# Patient Record
Sex: Female | Born: 1975 | Race: Black or African American | Hispanic: No | Marital: Married | Smoking: Never smoker
Health system: Southern US, Community
[De-identification: ages and names within clinical notes are randomized; demographics above are authoritative.]

## PROBLEM LIST (undated history)

## (undated) DIAGNOSIS — K219 Gastro-esophageal reflux disease without esophagitis: Secondary | ICD-10-CM

## (undated) DIAGNOSIS — R51 Headache: Secondary | ICD-10-CM

## (undated) DIAGNOSIS — J45909 Unspecified asthma, uncomplicated: Secondary | ICD-10-CM

## (undated) DIAGNOSIS — T8484XA Pain due to internal orthopedic prosthetic devices, implants and grafts, initial encounter: Secondary | ICD-10-CM

## (undated) DIAGNOSIS — I10 Essential (primary) hypertension: Secondary | ICD-10-CM

## (undated) DIAGNOSIS — K589 Irritable bowel syndrome without diarrhea: Secondary | ICD-10-CM

## (undated) DIAGNOSIS — M1990 Unspecified osteoarthritis, unspecified site: Secondary | ICD-10-CM

## (undated) DIAGNOSIS — Q6650 Congenital pes planus, unspecified foot: Secondary | ICD-10-CM

## (undated) DIAGNOSIS — R519 Headache, unspecified: Secondary | ICD-10-CM

## (undated) HISTORY — PX: WISDOM TOOTH EXTRACTION: SHX21

## (undated) HISTORY — PX: INTRAUTERINE DEVICE INSERTION: SHX323

## (undated) HISTORY — DX: Congenital pes planus, unspecified foot: Q66.50

## (undated) HISTORY — DX: Unspecified asthma, uncomplicated: J45.909

## (undated) HISTORY — DX: Gastro-esophageal reflux disease without esophagitis: K21.9

## (undated) HISTORY — DX: Headache: R51

## (undated) HISTORY — DX: Headache, unspecified: R51.9

## (undated) HISTORY — PX: FOOT SURGERY: SHX648

---

## 1998-07-25 ENCOUNTER — Emergency Department (HOSPITAL_COMMUNITY): Admission: EM | Admit: 1998-07-25 | Discharge: 1998-07-25 | Payer: Self-pay | Admitting: Emergency Medicine

## 1998-07-25 ENCOUNTER — Encounter: Payer: Self-pay | Admitting: Emergency Medicine

## 1999-01-04 ENCOUNTER — Emergency Department (HOSPITAL_COMMUNITY): Admission: EM | Admit: 1999-01-04 | Discharge: 1999-01-04 | Payer: Self-pay

## 1999-11-26 ENCOUNTER — Other Ambulatory Visit: Admission: RE | Admit: 1999-11-26 | Discharge: 1999-11-26 | Payer: Self-pay | Admitting: Obstetrics & Gynecology

## 1999-12-09 ENCOUNTER — Encounter: Payer: Self-pay | Admitting: Pulmonary Disease

## 1999-12-09 ENCOUNTER — Ambulatory Visit (HOSPITAL_COMMUNITY): Admission: RE | Admit: 1999-12-09 | Discharge: 1999-12-09 | Payer: Self-pay | Admitting: Pulmonary Disease

## 2000-09-01 HISTORY — PX: FOOT SURGERY: SHX648

## 2002-03-31 ENCOUNTER — Other Ambulatory Visit: Admission: RE | Admit: 2002-03-31 | Discharge: 2002-03-31 | Payer: Self-pay | Admitting: Obstetrics and Gynecology

## 2003-04-10 ENCOUNTER — Other Ambulatory Visit: Admission: RE | Admit: 2003-04-10 | Discharge: 2003-04-10 | Payer: Self-pay | Admitting: Obstetrics and Gynecology

## 2004-08-19 ENCOUNTER — Other Ambulatory Visit: Admission: RE | Admit: 2004-08-19 | Discharge: 2004-08-19 | Payer: Self-pay | Admitting: Obstetrics and Gynecology

## 2004-10-02 ENCOUNTER — Ambulatory Visit: Payer: Self-pay | Admitting: Pulmonary Disease

## 2005-01-20 ENCOUNTER — Other Ambulatory Visit: Admission: RE | Admit: 2005-01-20 | Discharge: 2005-01-20 | Payer: Self-pay | Admitting: Obstetrics and Gynecology

## 2005-06-29 ENCOUNTER — Inpatient Hospital Stay (HOSPITAL_COMMUNITY): Admission: AD | Admit: 2005-06-29 | Discharge: 2005-06-29 | Payer: Self-pay | Admitting: Obstetrics and Gynecology

## 2005-07-07 ENCOUNTER — Inpatient Hospital Stay (HOSPITAL_COMMUNITY): Admission: AD | Admit: 2005-07-07 | Discharge: 2005-07-07 | Payer: Self-pay | Admitting: Obstetrics and Gynecology

## 2005-07-11 ENCOUNTER — Inpatient Hospital Stay (HOSPITAL_COMMUNITY): Admission: AD | Admit: 2005-07-11 | Discharge: 2005-07-16 | Payer: Self-pay | Admitting: Obstetrics and Gynecology

## 2005-08-27 ENCOUNTER — Other Ambulatory Visit: Admission: RE | Admit: 2005-08-27 | Discharge: 2005-08-27 | Payer: Self-pay | Admitting: Obstetrics and Gynecology

## 2005-10-10 ENCOUNTER — Emergency Department (HOSPITAL_COMMUNITY): Admission: EM | Admit: 2005-10-10 | Discharge: 2005-10-11 | Payer: Self-pay | Admitting: Emergency Medicine

## 2006-02-10 ENCOUNTER — Inpatient Hospital Stay (HOSPITAL_COMMUNITY): Admission: AD | Admit: 2006-02-10 | Discharge: 2006-02-10 | Payer: Self-pay | Admitting: Obstetrics and Gynecology

## 2006-05-23 ENCOUNTER — Inpatient Hospital Stay (HOSPITAL_COMMUNITY): Admission: AD | Admit: 2006-05-23 | Discharge: 2006-05-23 | Payer: Self-pay | Admitting: Obstetrics and Gynecology

## 2006-06-03 ENCOUNTER — Inpatient Hospital Stay (HOSPITAL_COMMUNITY): Admission: AD | Admit: 2006-06-03 | Discharge: 2006-06-06 | Payer: Self-pay | Admitting: Obstetrics and Gynecology

## 2006-06-03 ENCOUNTER — Encounter (INDEPENDENT_AMBULATORY_CARE_PROVIDER_SITE_OTHER): Payer: Self-pay | Admitting: Specialist

## 2007-02-13 IMAGING — US US FETAL BPP W/O NONSTRESS
1 series · 13 of 28 positions shown · non-contrast
Comparison: none

CLINICAL DATA: 36 weeks estimated gestational age with hypertension.  Assess presentation, estimated fetal weight and fetal well-being.

[Series 1: us fetal bpp w/o nonstress · 0.35mm/px · 13 of 44 slices shown]
[im 2/44]
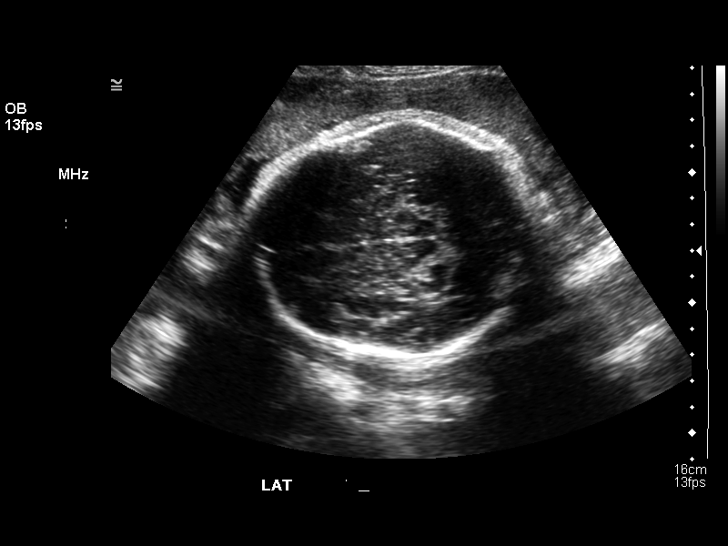
[im 5/44]
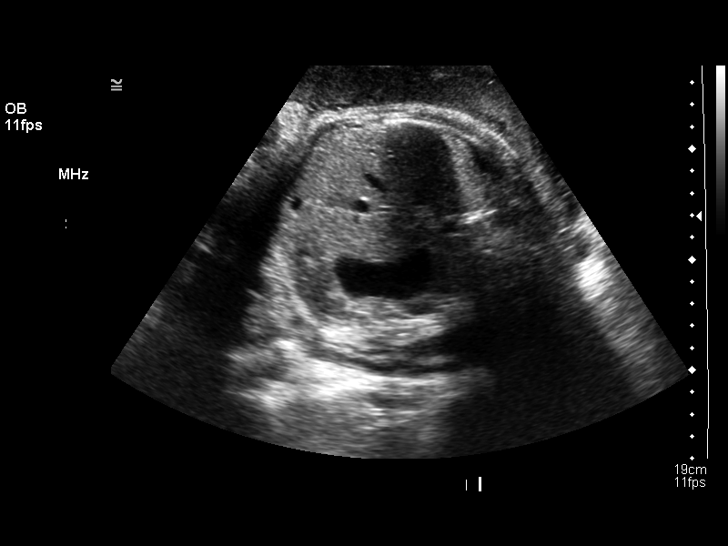
[im 8/44]
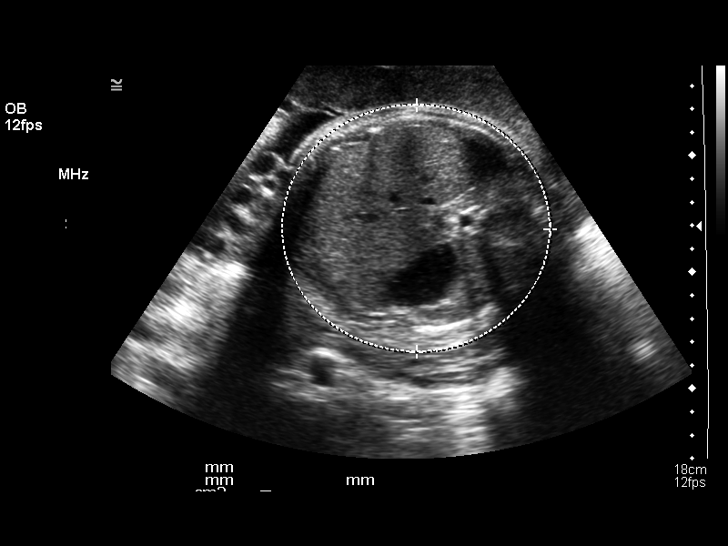
[im 12/44]
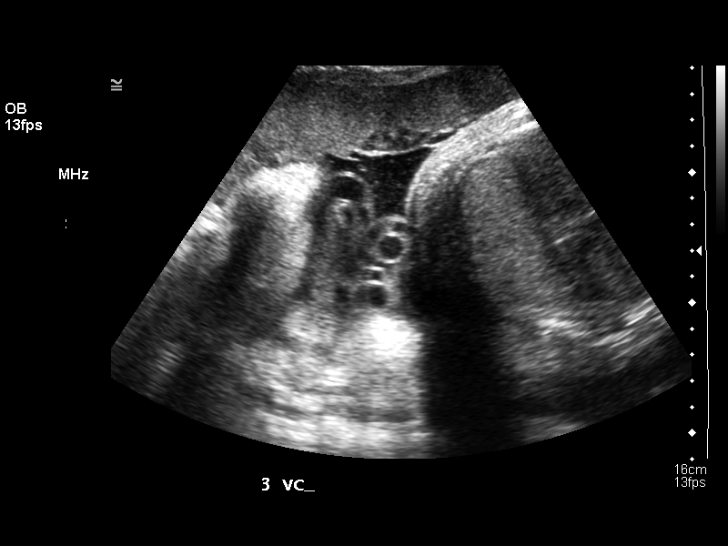
[im 15/44]
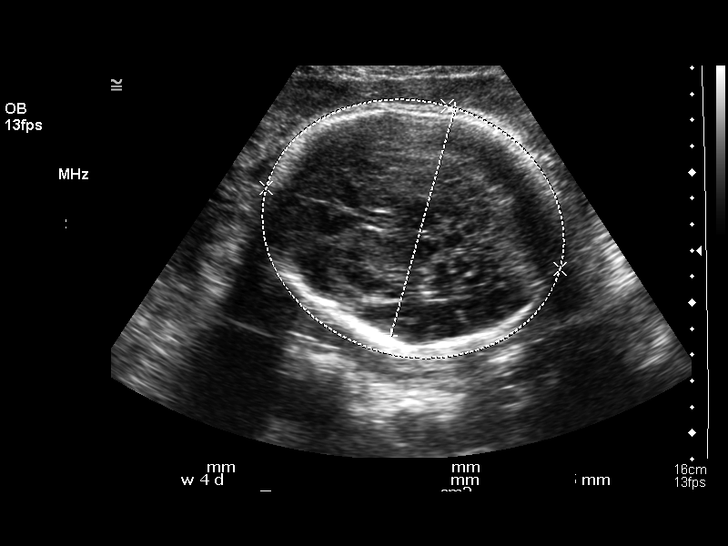
[im 18/44]
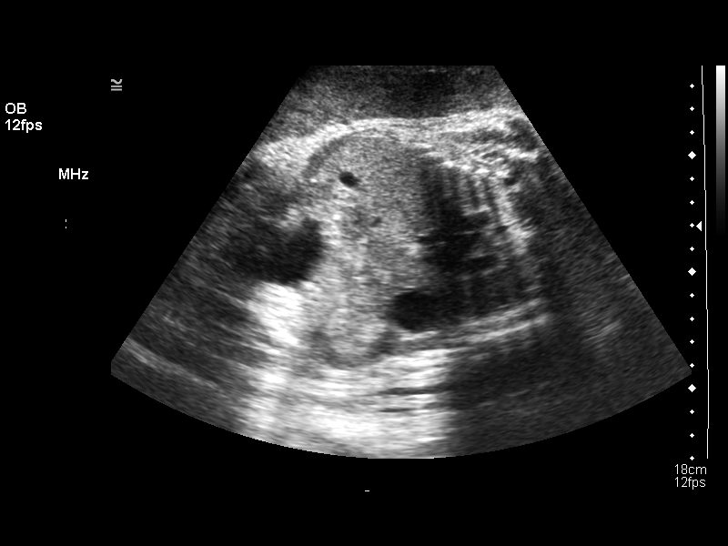
[im 23/44]
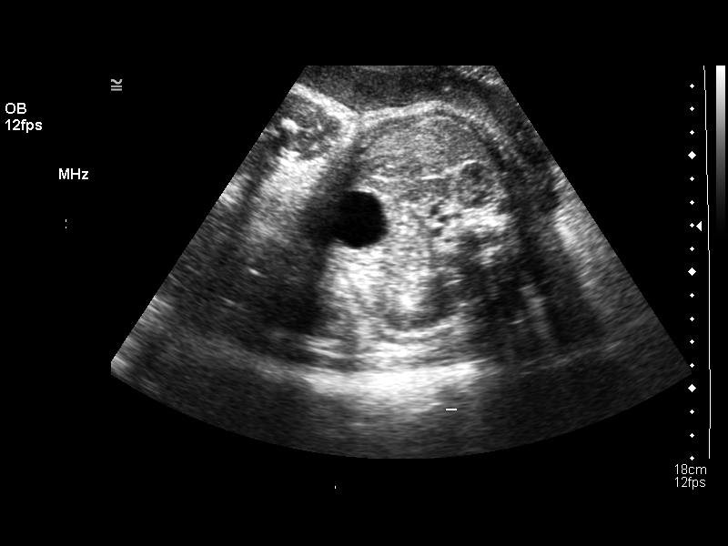
[im 26/44]
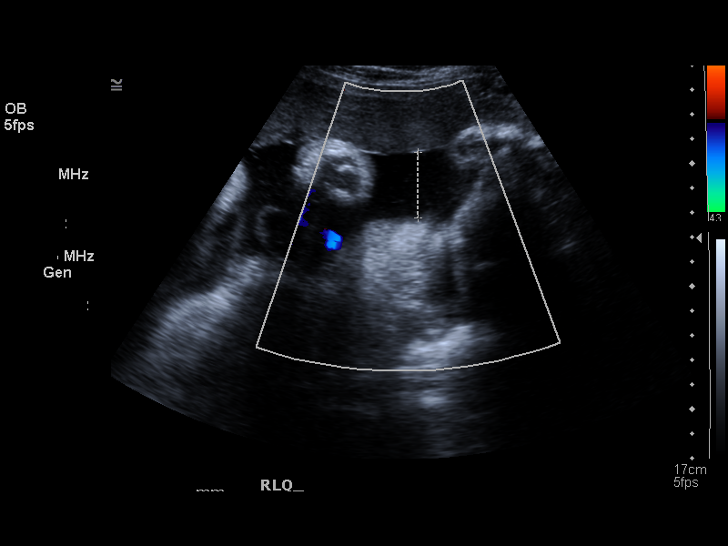
[im 29/44]
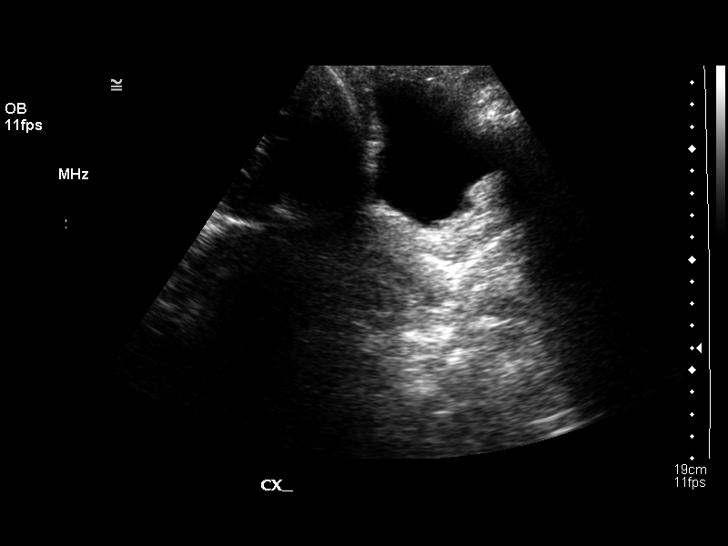
[im 32/44]
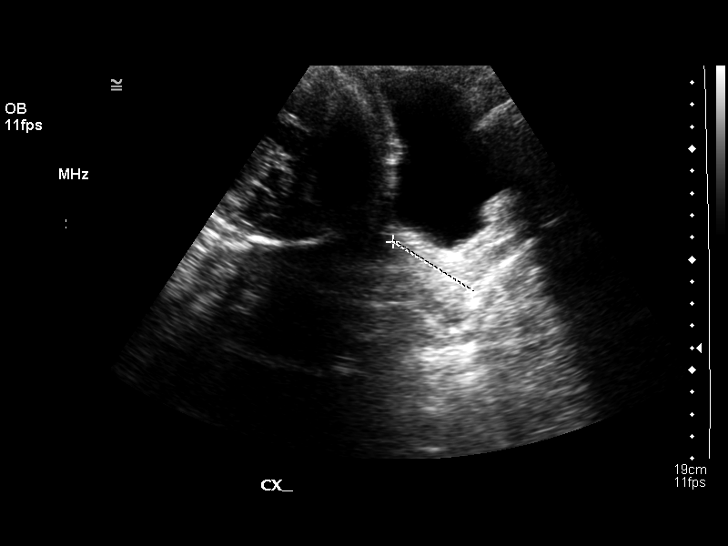
[im 36/44]
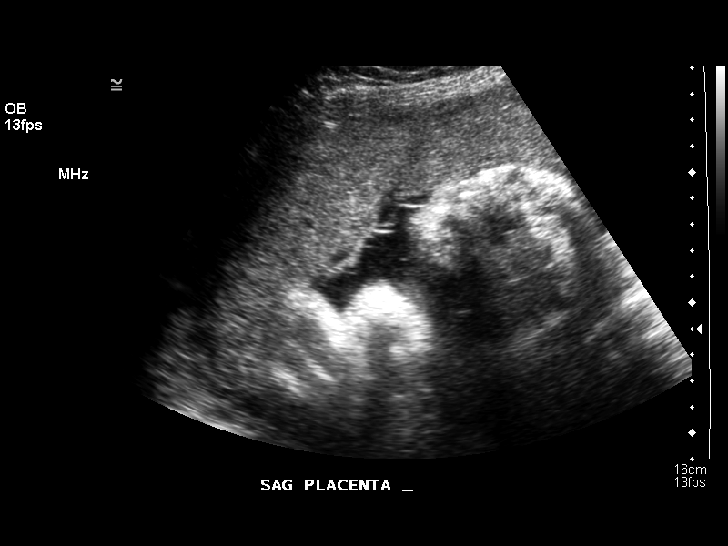
[im 39/44]
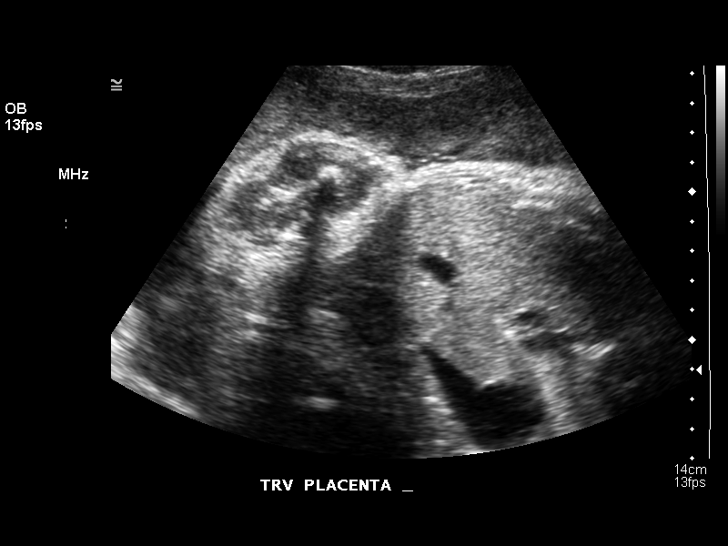
[im 42/44]
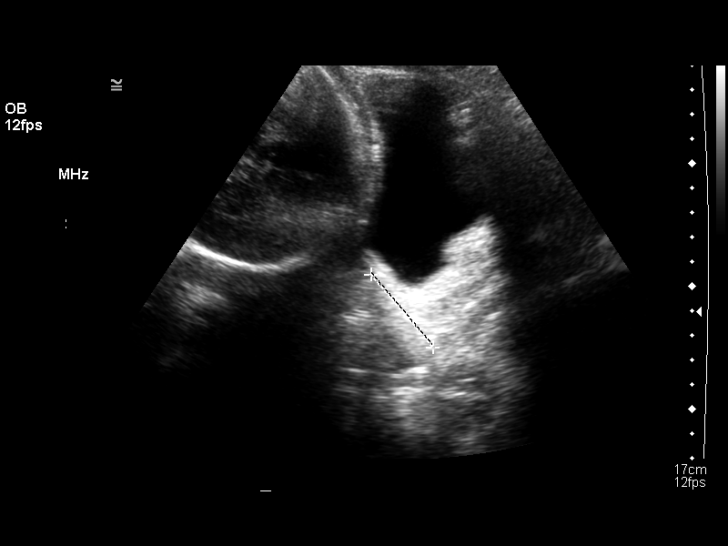

[13 of 28 positions shown; findings below may reference images not displayed]

OBSTETRICAL ULTRASOUND:
Number of Fetuses:  1
Heart Rate:  141
Movement:  Yes
Breathing:  Yes  
Presentation:  Cephalic
Placental Location:  Fundal, anterior
Grade:  II
Previa:  No
Amniotic Fluid (Subjective):  Normal
Amniotic Fluid (Objective):   10.4 cm AFI (5th -95th%ile = 7.7 ? 24.7 cm for 36 wks)

FETAL BIOMETRY
BPD:  9.2 cm   37 w 1 d
HC:  33.5 cm  38 w 2 d
AC:  34.8 cm   38 w 5 d
FL:  7.2 cm   37 w 0 d

MEAN GA:  37 w 6 d
Fetal indices are within normal limits.  
EFW:  7777 g (H) 90th ? 95th%ile (3532 ? 6303 g) For 36 wks

FETAL ANATOMY
Lateral Ventricles:    Visualized 
Thalami/CSP:      Visualized 
Posterior Fossa:  Not visualized 
Nuchal Region:    N/A
Spine:      Not visualized 
4 Chamber Heart on Left:      Not visualized 
Stomach on Left:      Visualized 
3 Vessel Cord:    Visualized 
Cord Insertion site:    Not visualized 
Kidneys:  Not visualized 
Bladder:  Visualized 
Extremities:      Not visualized 

ADDITIONAL ANATOMY VISUALIZED:  Orbits, diaphragm, and male genitalia.

Evaluation limited by:  Advanced gestational age.

MATERNAL UTERINE AND ADNEXAL FINDINGS
Cervix:   3.8 cm Transabdominally
BIOPHYSICAL PROFILE

Movement:  2    Time:  20 minutes
Breathing:  2
Tone:  2
Amniotic Fluid:  2

Total Score:  8
IMPRESSION: 1.  Single intrauterine pregnancy demonstrating an estimated gestational age by ultrasound of 37 weeks and 6 days.  This is 1 week and 6 days ahead of assigned gestational age of 36 weeks and 0 days.  The abdominal circumference is larger than the remaining gestational indicators with an absolute measurement of 34.8 cm (38 w 5 d).  Currently the estimated fetal weight is between the 90th and 95th percentile for a 36 week gestation and findings are compatible with a large-for-gestational-age fetus.  Continued close follow-up for growth is recommended. 
2.  Subjectively and quantitatively normal amniotic fluid volume and normal cervical length.
3.  No late developing fetal anatomic abnormalities are identified associated with the lateral ventricles, stomach, kidneys or bladder.  A four chamber heart view could not be reassessed due to positioning on today?s exam.  
4.  Biophysical profile score [DATE].

## 2007-11-07 ENCOUNTER — Emergency Department (HOSPITAL_COMMUNITY): Admission: EM | Admit: 2007-11-07 | Discharge: 2007-11-07 | Payer: Self-pay | Admitting: Emergency Medicine

## 2008-08-16 ENCOUNTER — Ambulatory Visit (HOSPITAL_COMMUNITY): Admission: RE | Admit: 2008-08-16 | Discharge: 2008-08-16 | Payer: Self-pay | Admitting: Obstetrics and Gynecology

## 2008-08-16 ENCOUNTER — Encounter (INDEPENDENT_AMBULATORY_CARE_PROVIDER_SITE_OTHER): Payer: Self-pay | Admitting: Obstetrics and Gynecology

## 2008-08-16 HISTORY — PX: DILATION AND EVACUATION: SHX1459

## 2010-04-09 ENCOUNTER — Encounter: Admission: RE | Admit: 2010-04-09 | Discharge: 2010-04-09 | Payer: Self-pay | Admitting: Orthopedic Surgery

## 2010-04-12 ENCOUNTER — Encounter: Admission: RE | Admit: 2010-04-12 | Discharge: 2010-04-12 | Payer: Self-pay | Admitting: Orthopedic Surgery

## 2010-12-14 ENCOUNTER — Inpatient Hospital Stay (INDEPENDENT_AMBULATORY_CARE_PROVIDER_SITE_OTHER)
Admission: RE | Admit: 2010-12-14 | Discharge: 2010-12-14 | Disposition: A | Discharge status: Home / Self Care | Payer: BLUE CROSS/BLUE SHIELD | Source: Ambulatory Visit | Attending: Family Medicine | Admitting: Family Medicine

## 2010-12-14 DIAGNOSIS — J069 Acute upper respiratory infection, unspecified: Secondary | ICD-10-CM

## 2011-01-14 NOTE — Op Note (Signed)
[name], [name]               ACCOUNT NO.:  [id number]   MEDICAL RECORD NO.:  [id number]          PATIENT TYPE:  AMB   LOCATION:  SDC                           FACILITY:  [facility]   PHYSICIAN:  Michelle L. Grewal, M.D.DATE OF BIRTH:  [birth year]   DATE OF PROCEDURE:  08/16/2008  DATE OF DISCHARGE:                               OPERATIVE REPORT   PREOPERATIVE DIAGNOSIS:  Missed abortion.   POSTOPERATIVE DIAGNOSIS:  Missed abortion.   PROCEDURE:  Dilatation and evacuation.   SURGEON:  Michelle L. Vincente Poli, MD   ANESTHESIA:  MAC with local.   FINDINGS:  Products of conception sent to pathology.   ESTIMATED BLOOD LOSS:  Minimal.   COMPLICATIONS:  None.   PROCEDURE:  The patient was taken to the operating room where she was  prepped and draped in usual sterile fashion.  In-and-out catheter was  used to empty the bladder.  Speculum was inserted into the vagina.  The  cervix was grasped with a tenaculum and a paracervical block was  performed in standard fashion.  The cervical internal os was gently  dilated using Pratt dilators.  A #7 suction cannula was inserted and a  thorough suction curettage was performed with retrieval of products of  conception.  The suction cannula was removed and a sharp curette was  inserted and the uterus was thoroughly curetted.  A final suction  curettage was performed in the uterine cavity appeared to be completely  clean.  There was no bleeding noted at the end of the procedure.  All  sponge, lap and instrument counts were correct x2.  The patient went to  recovery room in stable condition.      Michelle L. Vincente Poli, M.D.  Electronically Signed     MLG/MEDQ  D:  08/16/2008  T:  08/17/2008  Job:  [id number]

## 2011-01-17 NOTE — Discharge Summary (Signed)
[name], [name]               ACCOUNT NO.:  [id number]   MEDICAL RECORD NO.:  [id number]          PATIENT TYPE:  INP   LOCATION:  9130                          FACILITY:  [facility]   PHYSICIAN:  Freddy Finner, M.D.   DATE OF BIRTH:  1976/07/06   DATE OF ADMISSION:  06/03/2006  DATE OF DISCHARGE:  06/06/2006                                 DISCHARGE SUMMARY   ADMITTING DIAGNOSES:  1. Intrauterine pregnancy at 37-1/7 weeks estimated gestational age.  2. Previous cesarean section, desires repeat.  3. Pregnancy-induced hypertension.   DISCHARGE DIAGNOSES:  1. Status post low transverse cesarean section.  2. Viable female infant.   PROCEDURE:  Repeat low transverse cesarean section.   REASON FOR ADMISSION:  Please see written H&P.   HOSPITAL COURSE:  The patient is a 35 year old African American married  female gravida 3, para 2 who presented to [facility] for a  cesarean delivery.  The patient had been followed in the office for  increasing blood pressures.  The patient had been evaluated on the day of  admission in the office with noted blood pressure 150/80.  The patient was  complaining of headaches becoming more frequent and worsening blurry vision.  PIH labs had been drawn which were within normal limits.  The patient had  had two previous cesarean sections and desired repeat.  Due to a history of  PIH with two previous pregnancies and increasing blood pressure, headaches,  and blurred vision, decision was made to proceed with a primary low  transverse cesarean section.  The patient was then taken to the operating  room where spinal anesthesia was administered without difficulty.  A low  transverse incision was made, with delivery of a viable female infant weighing  6 pounds 9 ounces, Apgars of 8 at 1 minute and 9 at 5 minutes.  Arterial  cord pH was 7.29.  The patient tolerated procedure well and was taken to the  recovery room in stable condition.   On  postoperative day #1, the patient was without complaint.  She denied  headache, blurred vision, or right upper quadrant pain.  Vital signs were  stable with blood pressures 130s/70s.  Urine output had been good, measuring  200-300 cc per hour.  Laboratories revealed hemoglobin of 11.0.  The abdomen  was soft.  The fundus was firm and nontender.  The abdominal dressing was  noted to be clean, dry and intact.  Deep tendon reflexes were 1+.  The  patient had been receiving magnesium sulfate.  Decision was made to  discontinue the magnesium sulfate due to diuresing and blood pressures  stable and transfer the patient to the mother/baby unit later that morning.   On postoperative day #2, the patient was without complaint.  Vital signs  remained stable.  She was afebrile.  The abdomen was soft.  The fundus was  firm and nontender.  The incision was clean, dry and intact.  The patient  was ambulating well, tolerating a regular diet by complaints of nausea or  vomiting.  Later that evening, blood pressure was noted to  be elevated to  156/95, heart rate 81, and respirations were 20.  The patient was started on  some labetalol 200 mg.  On the following morning it was reduced to 100 mg  b.i.d.   On postoperative day #3, the patient was without complaint.  Vital signs  were stable.  Blood pressure 132/84.  The fundus was firm and nontender.  The incision was clean, dry and intact.  Discharge instructions were  reviewed, and the patient was later discharged home.   CONDITION ON DISCHARGE:  Stable.   DIET:  Regular as tolerated.   ACTIVITY:  No heavy lifting, no driving x2 weeks, no vaginal entry.   FOLLOW UP:  Patient is to follow up in the office in 3 days for a blood  pressure check and staple removal.  She is to call for temperature greater  than 100 degrees, persistent nausea, vomiting, heavy vaginal bleeding,  and/or redness or drainage from the incisional site.  The patient was also   instructed to call for headache unrelieved by Tylenol, blurred vision, or  epigastric discomfort.   DISCHARGE MEDICATIONS:  1. Labetalol 100 mg one p.o. b.i.d.  2. Percocet 5/325, #30, one p.o. every 4 to 6 hours p.r.n.  3. Motrin 600 mg every 6 hours.  4. Prenatal vitamins one p.o. daily.      Julio Sicks, N.P.      Freddy Finner, M.D.  Electronically Signed    CC/MEDQ  D:  06/25/2006  T:  06/26/2006  Job:  [id number]

## 2011-01-17 NOTE — Op Note (Signed)
NAMEMAIRI, STAGLIANO               ACCOUNT NO.:  [id number]   MEDICAL RECORD NO.:  [id number]          PATIENT TYPE:  INP   LOCATION:  9145                          FACILITY:  [facility]   PHYSICIAN:  Michelle L. Grewal, M.D.DATE OF BIRTH:  [birth year]   DATE OF PROCEDURE:  07/13/2005  DATE OF DISCHARGE:                                 OPERATIVE REPORT   PREOPERATIVE DIAGNOSES:  1.  Intrauterine pregnancy at 37 weeks and five days.  2.  Arrest of dilation 6 cm.  3.  Chorioamnionitis.   POSTOPERATIVE DIAGNOSES:  1.  Intrauterine pregnancy at 37 weeks and five days.  2.  Arrest of dilation 6 cm.  3.  Chorioamnionitis.   OPERATION/PROCEDURE:  Primary low transverse cesarean section.   SURGEON:  Michelle L. Vincente Poli, M.D.   ANESTHESIA:  Epidural.   OPERATIVE FINDINGS:  Female infant, Apgars 8 at one minute and 9 at five  minutes.   ESTIMATED BLOOD LOSS:  500 mL.   COMPLICATIONS:  None.   DESCRIPTION OF PROCEDURE:  The patient was taken to the operating room. Her  epidural was dosed and found to be adequate. She was prepped and draped in  the usual sterile fashion.  Foley was already in the bladder.  A low  transverse incision was made and carried down to the fascia.  The fascia was  scored in the midline and __________ . The rectus muscles were separated in  the midline and peritoneum was entered bluntly.  The peritoneal incision was  addressed.  The bladder blade was inserted and the lower uterine segment was  identified.  The bladder flap was created sharply and then digitally.  The  bladder blade was then readjusted.  A low transverse incision was made in  the uterus. The uterus was entered using the hemostat.  The baby was in the  cephalic presentation and was delivered quite easily.  It was a female infant,  Apgars 8 at one minute and 9 at five minutes.  The cord was clamped and cut.  The baby was handed to the waiting pediatrician. The placenta was manually  removed.  Noted to be  normal and intact with a three-vessel cord.  The  uterus was exteriorized and cleared of all clots and debris.  The uterine  incision was closed with a single layer using __________  stitch.  It was  hemostatic.  Irrigation was performed. The peritoneum was closed with a  Vicryl stitch continuous running stitch.  The rectus muscles were  reapproximated  using 7-0 Vicryl.  The fascia was closed using 0 Vicryl continuous running  stitch __________ .  After irrigation of the subcutaneous layers, the skin  was closed with staples.  All sponge, lap and instrument counts were correct  x2.  The patient tolerated the procedure well and went to the recovery room  in stable condition.      Michelle L. Vincente Poli, M.D.  Electronically Signed     MLG/MEDQ  D:  07/13/2005  T:  07/13/2005  Job:  [id number]

## 2011-01-17 NOTE — Op Note (Signed)
[name], [name]               ACCOUNT NO.:  [id number]   MEDICAL RECORD NO.:  [id number]          PATIENT TYPE:  INP   LOCATION:  9130                          FACILITY:  [facility]   PHYSICIAN:  Guy Sandifer. Henderson Cloud, M.D. DATE OF BIRTH:  [birth year]   DATE OF PROCEDURE:  06/03/2006  DATE OF DISCHARGE:                                 OPERATIVE REPORT   PREOPERATIVE DIAGNOSIS:  1. Intrauterine pregnancy at 37-1/7 weeks estimated gestational age.  2. Pregnancy-induced hypertension.  3. Previous cesarean section, desires repeat.   POSTOPERATIVE DIAGNOSIS:  1. Intrauterine pregnancy at 37-1/7 weeks estimated gestational age.  2. Pregnancy-induced hypertension.  3. Previous cesarean section, desires repeat.   PROCEDURE:  Low transverse cesarean section.   SURGEON:  Guy Sandifer. Henderson Cloud, M.D.   ANESTHESIA:  Spinal.   SPECIMENS:  Placenta to pathology.   ESTIMATED BLOOD LOSS:  800 mL.   FINDINGS:  Viable female infant, Apgars of 8 and 9 at 1 and 5 minutes,  respectively.  Birth weight pending.  Arterial cord pH 7.29.   INDICATIONS AND CONSENT:  This patient is a 35 year old married black female  G3, P2 with an EDC of June 23, 2006 who has a history of previous  cesarean section.  She also has a history of preeclampsia with her other two  pregnancies.  She has been followed in the office for increasing blood  pressures.  Today, blood pressures are in the 150s/80s.  Her headaches are  more frequent, and she has worsening blurry vision.  Laboratories are within  normal limits.  After discussion of the options, delivery is recommended.  Cesarean section was discussed with the patient preoperatively.  The  potential risks and complications were discussed preoperatively including  but limited to infection, bowel, bladder, or ureteral damage, bleeding  requiring transfusion of blood products with possible transfusion reaction,  HIV and hepatitis acquisition, DVT, PE, and pneumonia.  All  questions were  answered, and consent is signed on the chart.   PROCEDURE:  The patient is taken to operating room where she is identified.  Spinal anesthetic is placed, and she is placed in dorsal supine position  with a 15-degree left lateral wedge.  She is prepped, a Foley catheter is  placed, the bladder is drained, and she is draped in sterile fashion.   After testing for adequate spinal anesthesia, the skin is entered through a  Pfannenstiel incision, taking out the old scar on the way in.  Dissection is  carried out in layers of the peritoneum.  The peritoneum is incised and  extended superiorly and inferiorly.  The vesicouterine peritoneum was taken  down cephalolaterally, the bladder flap is developed, and the bladder blade  is placed.  The uterus  is incised in a low transverse manner, and the  uterine cavity is entered bluntly with a hemostat.  The uterine incision is  extended cephalolaterally with the fingers.  Artificial rupture of membranes  for clear fluid is carried out.  The vertex is delivered with the Tender  Touch vacuum extractor with one very easy pull and no popoffs.  Nuchal  cord  x1 is reduced.  The oronasopharynx is suctioned.  The remainder of the baby  is delivered.  Good cry and tone is noted.  The cord is clamped and cut, and  the baby is handed to the awaiting pediatrics team.  The placenta is  manually delivered and sent to pathology.  The uterine cavity is clean.  The  uterus is closed in two running locking imbricating layers of 0 Monocryl  suture.  A figure-of-eight 2-0 chromic suture is placed at the right aspect  of the incision to obtain complete hemostasis.  The tubes and ovaries  palpate normally bilaterally.  The anterior peritoneum is closed in a  running fashion with 0 Monocryl suture which is also used to reapproximate  the pyramidalis muscle in the midline.  The anterior rectus fascia is closed  in running fashion with a 0 PDS suture, and the  skin is closed with clips.  All sponge, instrument, and needle counts are correct.      Guy Sandifer Henderson Cloud, M.D.  Electronically Signed     JET/MEDQ  D:  06/03/2006  T:  06/05/2006  Job:  [id number]

## 2011-01-17 NOTE — Discharge Summary (Signed)
[name], [name]               ACCOUNT NO.:  [id number]   MEDICAL RECORD NO.:  [id number]          PATIENT TYPE:  INP   LOCATION:  9145                          FACILITY:  [facility]   PHYSICIAN:  Juluis Mire, M.D.   DATE OF BIRTH:  10/24/1975   DATE OF ADMISSION:  07/12/2005  DATE OF DISCHARGE:  07/16/2005                                 DISCHARGE SUMMARY   ADMITTING DIAGNOSES:  1.  Intrauterine pregnancy at term.  2.  Induction of labor secondary to pregnancy-induced hypertension.   DISCHARGE DIAGNOSES:  1.  Status post low transverse cesarean section secondary to failure to      progress.  2.  A viable female infant.   PROCEDURE:  Primary low transverse cesarean section.   REASON FOR ADMISSION:  Please see written H&P.   HOSPITAL COURSE:  The patient is 35 year old gravida 2, para 1 that  presented to [facility] for induction of labor secondary to  pregnancy-induced hypertension. The patient also had noted increase in lower  extremity edema with elevated blood pressures. The patient had performed a  24-hour urine collection approximately a week and a half prior to admission  which noted 300 milligrams of protein. The patient had had history of a  shoulder dystocia with her first delivery. On admission, fetal heart tones  were reassuring. Tocometer did reveal some irregular contractions. Cervix  was 2 cm dilated, 80% effaced, vertex at a -2 station. Artificial rupture of  membranes was performed revealing clear fluid. Pitocin was started to  augment her labor.  Epidural was placed for the patient's comfort. The  patient did progress very slowly.  An intrauterine pressure catheter was  placed earlier in the evening.  The patient made no further progress beyond  6 cm, and no change over approximately a 4-hour period time.  Fetal heart  tones were stable. However, the patient had developed a temperature of 101.  It was determined that she had developed  chorioamnionitis.  The anterior  cervix was also noted to be very swollen, and the decision was made to  proceed with a primary low cesarean delivery for arrest of dilatation. The  patient was then transferred to the operating room where epidural was dosed  to an adequate surgical level. A low transverse incision was made with  delivery of a viable female infant weighing 7 pounds 3 ounces with Apgars of 8  and one minute and 9 at five minutes. The patient tolerated the procedure  well and was taken to the recovery room in stable condition.   On postoperative day #1, the patient was without complaint. She was  tolerating a regular diet with no complaints of nausea or vomiting. Vital  signs were stable. She was afebrile with temperature max of 99.6. Abdomen  was soft, nontender. Fundus was firm at the umbilicus.  The incision was  noted to be clean, dry and intact. Laboratory findings revealed hemoglobin  of 8.7, WBC count of 10.3.   On postoperative day #2, the patient was without complaint. Vital signs were  stable. She was now afebrile. Abdomen  was soft with good return of bowel  function. Fundus was firm and nontender. Incision was clean, dry and intact.   On postoperative day #3, the patient was without complaint. Vital signs were  stable. Abdomen was soft. Fundus was firm and nontender. Incision was clean,  dry and intact. Staples were removed, and the patient was discharged home.   CONDITION ON DISCHARGE:  Good.   DIET:  Regular as tolerated.   ACTIVITY:  No heavy lifting, no driving x2 weeks, no vaginal entry.   FOLLOW UP:  Patient is to follow up in the office in 1 week for an incision  check. She is to call for temperature greater than 100 degrees, persistent  nausea or vomiting, heavy vaginal bleeding and/or redness or drainage from  the incisional site.   DISCHARGE MEDICATIONS:  1.  Darvocet N 100, #30, one p.o. every 4-6 hours p.r.n.  2.  Motrin 600 milligrams every 6  hours p.r.n.  3.  Ferrous sulfate 325 milligrams one p.o. b.i.d. with meals.  4.  Colace one p.o. daily p.r.n.      [name], N.P.      Juluis Mire, M.D.  Electronically Signed    CC/MEDQ  D:  08/11/2005  T:  08/11/2005  Job:  [id number]

## 2011-06-06 LAB — CBC
MCV: 88.7 fL (ref 78.0–100.0)
RBC: 4.4 MIL/uL (ref 3.87–5.11)
RDW: 13.3 % (ref 11.5–15.5)

## 2012-04-16 ENCOUNTER — Encounter (HOSPITAL_COMMUNITY): Payer: Self-pay | Admitting: Emergency Medicine

## 2012-04-16 ENCOUNTER — Emergency Department (HOSPITAL_COMMUNITY)
Admission: EM | Admit: 2012-04-16 | Discharge: 2012-04-16 | Disposition: A | Discharge status: Home / Self Care | Payer: BLUE CROSS/BLUE SHIELD | Source: Home / Self Care | Attending: Family Medicine | Admitting: Family Medicine

## 2012-04-16 DIAGNOSIS — I10 Essential (primary) hypertension: Secondary | ICD-10-CM

## 2012-04-16 HISTORY — DX: Essential (primary) hypertension: I10

## 2012-04-16 NOTE — ED Notes (Signed)
Reports headache, palpitations, sob today.  Yesterday bp was running high.  Reports bp 120-130's/70's-80's.  Reports that Wednesday she felt bad and checked bp 140's/90's.  Today bp 170's/105.

## 2012-04-16 NOTE — ED Notes (Signed)
Pt is oriented x3 and speaking full sentences with out difficulty breathing. Pt checks her BP at home with own BP machine. Pt answered her BP machine is not calibrated. Skin is dry and warm. Pt states having some heart palpations, and has no Chest pain. I advice Pt to let us know if anything else chances with she waits to be called. I informed RN Soto of Pt.

## 2012-04-16 NOTE — ED Notes (Signed)
Patient reports she does not take blood pressure medicine daily.  Reports she takes it when she feels bad.  Prior to feeling bad Wednesday, it had been probably a month since taking blood pressure medicine.  The difference this time is the palpitations and sob- this is why she has come to [facility].  Reports having palpitations today, maybe five episodes, some while at rest, sitting in waiting room, while in treatment room.  Denies any pain with palpitations

## 2012-04-19 NOTE — ED Provider Notes (Signed)
History     CSN: [id number]  Arrival date & time 04/16/12  1903   First MD Initiated Contact with Patient 04/16/12 1933      Chief Complaint  Patient presents with  . Hypertension    (Consider location/radiation/quality/duration/timing/severity/associated sxs/prior treatment) HPI Comments: 36 y/o non smoker female with h/o HTN here c/o and episodes of increased blood pressure associated with headache, palpitation and shortness of breath. Occurred once today and also similar event 2 days ago. Denies chest pain syncope or falls. No leg swelling or NPD. Has checked her BP at home during this event and BP was 120's-170's/70's-100's. She currently takes metoprolol extended release 100 mg daily. Her PCP is Dr. Milus Glazier at [facility]. Denies current symptoms. No headache or visual changes. No nausea or vomiting or diarrhea, no immsomnia. No cold or UTI symptoms. Denies sweats or dysphoric mood episodes. No prior h/o of anxiety or panic attacks. HTN was diagnosed years ago, patient reports strong family history of this condition. Unsure if she had a work up for secund ary causes of HTN. She is otherwise healthy.   Past Medical History  Diagnosis Date  . Hypertension     Past Surgical History  Procedure Date  . Foot surgery     No family history on file.  History  Substance Use Topics  . Smoking status: Never Smoker   . Smokeless tobacco: Not on file  . Alcohol Use: No    OB History    Grav Para Term Preterm Abortions TAB SAB Ect Mult Living                  Review of Systems  Constitutional: Negative for fever, chills, diaphoresis, activity change, appetite change, fatigue and unexpected weight change.       10 systems reviewed and  pertinent negative and positive symptoms are as per HPI.     Eyes: Negative for visual disturbance.  Respiratory: Negative for cough, chest tightness and wheezing.   Cardiovascular: Negative for chest pain, palpitations and leg swelling.    Gastrointestinal: Negative for nausea, vomiting, abdominal pain, diarrhea and constipation.  Genitourinary: Negative for dysuria and hematuria.  Skin: Negative for rash.  Neurological: Negative for dizziness, seizures, syncope, weakness and numbness.  Psychiatric/Behavioral: Negative for dysphoric mood. The patient is not nervous/anxious.   All other systems reviewed and are negative.    Allergies  Sulfa antibiotics  Home Medications   Current Outpatient Rx  Name Route Sig Dispense Refill  . METOPROLOL SUCCINATE ER 100 MG PO TB24 Oral Take 100 mg by mouth daily. Take with or immediately following a meal.    . MULTIVITAMINS PO Oral Take by mouth.      BP 147/96  Pulse 64  Temp 99.3 F (37.4 C) (Oral)  Resp 16  SpO2 100%  LMP 04/04/2012  Physical Exam  Nursing note and vitals reviewed. Constitutional: She is oriented to person, place, and time. She appears well-developed and well-nourished. No distress.  HENT:  Head: Normocephalic and atraumatic.  Mouth/Throat: Oropharynx is clear and moist. No oropharyngeal exudate.  Eyes: Conjunctivae and EOM are normal. Pupils are equal, round, and reactive to light. No scleral icterus.  Neck: Normal range of motion. Neck supple. No JVD present. No thyromegaly present.  Cardiovascular: Normal rate, regular rhythm, normal heart sounds and intact distal pulses.  Exam reveals no gallop and no friction rub.   No murmur heard.      No abdominal or back bruits.  Abdominal: Soft. She exhibits  no distension and no mass. There is no tenderness. There is no rebound and no guarding.  Lymphadenopathy:    She has no cervical adenopathy.  Neurological: She is alert and oriented to person, place, and time.  Skin: No rash noted.  Psychiatric: She has a normal mood and affect. Her behavior is normal. Judgment and thought content normal.    ED Course  Procedures (including critical care time)  Labs Reviewed - No data to display No results  found.   1. Hypertension    EKG: NSR, ventricular rate 61 bpm. No ischemic changes. No obvious signs of cardiomegaly.   MDM  paroxsymal episodes of hypertension. Possible response to excessive betablocker use in this young female?. Asymptomatic here with normal cardiovascular and physical exam. Recheck blood pressure prior discharge without any medical intervention was 147/96. Decided not to add another antihipertensive agent today. She is probably a candidate for a work up looking for secundary causes of HTN if not done before. Discussed with patient and she will follow up with her primary care provider next week for monitoring of her symptoms and adjust her medications. Asked to return to medical attention if new or persistent or worsening symptoms.         Sharin Grave, MD 04/19/12 1424

## 2012-12-15 ENCOUNTER — Encounter (HOSPITAL_BASED_OUTPATIENT_CLINIC_OR_DEPARTMENT_OTHER): Payer: Self-pay | Admitting: *Deleted

## 2012-12-21 ENCOUNTER — Encounter (HOSPITAL_BASED_OUTPATIENT_CLINIC_OR_DEPARTMENT_OTHER)
Admission: RE | Admit: 2012-12-21 | Discharge: 2012-12-21 | Disposition: A | Discharge status: Home / Self Care | Payer: BC Managed Care – PPO | Source: Ambulatory Visit | Attending: Orthopedic Surgery | Admitting: Orthopedic Surgery

## 2012-12-21 LAB — BASIC METABOLIC PANEL
BUN: 13 mg/dL (ref 6–23)
CO2: 29 mEq/L (ref 19–32)
Chloride: 104 mEq/L (ref 96–112)
GFR calc non Af Amer: >90 mL/min (ref >90)
Potassium: 4 mEq/L (ref 3.5–5.1)
Sodium: 140 mEq/L (ref 135–145)

## 2012-12-22 ENCOUNTER — Other Ambulatory Visit: Payer: Self-pay | Admitting: Orthopedic Surgery

## 2012-12-23 ENCOUNTER — Ambulatory Visit (HOSPITAL_COMMUNITY): Payer: BC Managed Care – PPO

## 2012-12-23 ENCOUNTER — Ambulatory Visit (HOSPITAL_BASED_OUTPATIENT_CLINIC_OR_DEPARTMENT_OTHER): Payer: BC Managed Care – PPO | Admitting: Anesthesiology

## 2012-12-23 ENCOUNTER — Encounter (HOSPITAL_BASED_OUTPATIENT_CLINIC_OR_DEPARTMENT_OTHER): Payer: Self-pay | Admitting: Anesthesiology

## 2012-12-23 ENCOUNTER — Encounter (HOSPITAL_BASED_OUTPATIENT_CLINIC_OR_DEPARTMENT_OTHER): Payer: Self-pay

## 2012-12-23 ENCOUNTER — Ambulatory Visit (HOSPITAL_BASED_OUTPATIENT_CLINIC_OR_DEPARTMENT_OTHER)
Admission: RE | Admit: 2012-12-23 | Discharge: 2012-12-24 | Disposition: A | Discharge status: Home / Self Care | Payer: BC Managed Care – PPO | Source: Ambulatory Visit | Attending: Orthopedic Surgery | Admitting: Orthopedic Surgery

## 2012-12-23 ENCOUNTER — Encounter (HOSPITAL_BASED_OUTPATIENT_CLINIC_OR_DEPARTMENT_OTHER)
Admission: RE | Disposition: A | Discharge status: Home / Self Care | Payer: Self-pay | Source: Ambulatory Visit | Attending: Orthopedic Surgery

## 2012-12-23 DIAGNOSIS — M2141 Flat foot [pes planus] (acquired), right foot: Secondary | ICD-10-CM

## 2012-12-23 DIAGNOSIS — Z882 Allergy status to sulfonamides status: Secondary | ICD-10-CM | POA: Insufficient documentation

## 2012-12-23 DIAGNOSIS — I10 Essential (primary) hypertension: Secondary | ICD-10-CM | POA: Insufficient documentation

## 2012-12-23 DIAGNOSIS — M2140 Flat foot [pes planus] (acquired), unspecified foot: Secondary | ICD-10-CM | POA: Insufficient documentation

## 2012-12-23 DIAGNOSIS — Q742 Other congenital malformations of lower limb(s), including pelvic girdle: Secondary | ICD-10-CM | POA: Insufficient documentation

## 2012-12-23 DIAGNOSIS — Z79899 Other long term (current) drug therapy: Secondary | ICD-10-CM | POA: Insufficient documentation

## 2012-12-23 HISTORY — PX: CALCANEAL OSTEOTOMY: SHX1281

## 2012-12-23 HISTORY — PX: GASTROCNEMIUS RECESSION: SHX863

## 2012-12-23 LAB — POCT HEMOGLOBIN-HEMACUE: Hemoglobin: 13.2 g/dL (ref 12.0–15.0)

## 2012-12-23 SURGERY — RECESSION, MUSCLE, GASTROCNEMIUS
Anesthesia: Regional | Laterality: Right | Wound class: Clean

## 2012-12-23 MED ORDER — METOCLOPRAMIDE HCL 5 MG/ML IJ SOLN: 10.0000 mg | Freq: Once | INTRAMUSCULAR | Status: DC | PRN

## 2012-12-23 MED ORDER — SENNA 8.6 MG PO TABS: 1.0000 | ORAL_TABLET | Freq: Two times a day (BID) | ORAL | Status: DC

## 2012-12-23 MED ORDER — BACITRACIN ZINC 500 UNIT/GM EX OINT
TOPICAL_OINTMENT | CUTANEOUS | Status: DC | PRN
  Administered 2012-12-23: 1 via TOPICAL

## 2012-12-23 MED ORDER — SENNOSIDES 8.6 MG PO TABS: 2.0000 | ORAL_TABLET | Freq: Every day | ORAL | Status: DC

## 2012-12-23 MED ORDER — LIDOCAINE HCL (CARDIAC) 20 MG/ML IV SOLN
INTRAVENOUS | Status: DC | PRN
  Administered 2012-12-23: 30 mg via INTRAVENOUS

## 2012-12-23 MED ORDER — ONDANSETRON HCL 4 MG/2ML IJ SOLN
INTRAMUSCULAR | Status: DC | PRN
  Administered 2012-12-23: 4 mg via INTRAVENOUS

## 2012-12-23 MED ORDER — 0.9 % SODIUM CHLORIDE (POUR BTL) OPTIME
TOPICAL | Status: DC | PRN
  Administered 2012-12-23: 1000 mL

## 2012-12-23 MED ORDER — ACETAMINOPHEN 10 MG/ML IV SOLN
1000.0000 mg | Freq: Once | INTRAVENOUS | Status: AC
  Administered 2012-12-23: 1000 mg via INTRAVENOUS

## 2012-12-23 MED ORDER — ONDANSETRON HCL 4 MG/2ML IJ SOLN: 4.0000 mg | Freq: Four times a day (QID) | INTRAMUSCULAR | Status: DC | PRN

## 2012-12-23 MED ORDER — DEXAMETHASONE SODIUM PHOSPHATE 10 MG/ML IJ SOLN
INTRAMUSCULAR | Status: DC | PRN
  Administered 2012-12-23: 10 mg via INTRAVENOUS

## 2012-12-23 MED ORDER — MIDAZOLAM HCL 2 MG/2ML IJ SOLN
1.0000 mg | INTRAMUSCULAR | Status: DC | PRN
  Administered 2012-12-23: 2 mg via INTRAVENOUS

## 2012-12-23 MED ORDER — LIDOCAINE HCL 1 % IJ SOLN
INTRAMUSCULAR | Status: DC | PRN
  Administered 2012-12-23: 2 mL via INTRADERMAL

## 2012-12-23 MED ORDER — ASPIRIN EC 325 MG PO TBEC: 325.0000 mg | DELAYED_RELEASE_TABLET | Freq: Every day | ORAL | Status: DC

## 2012-12-23 MED ORDER — OXYCODONE HCL 5 MG PO TABS
5.0000 mg | ORAL_TABLET | ORAL | Status: DC | PRN
  Administered 2012-12-23: 5 mg via ORAL
  Administered 2012-12-24: 10 mg via ORAL

## 2012-12-23 MED ORDER — ONDANSETRON HCL 4 MG PO TABS: 4.0000 mg | ORAL_TABLET | Freq: Four times a day (QID) | ORAL | Status: DC | PRN

## 2012-12-23 MED ORDER — PROPOFOL 10 MG/ML IV BOLUS
INTRAVENOUS | Status: DC | PRN
  Administered 2012-12-23: 240 mg via INTRAVENOUS

## 2012-12-23 MED ORDER — ROPIVACAINE HCL 5 MG/ML IJ SOLN
INTRAMUSCULAR | Status: DC | PRN
  Administered 2012-12-23: 25 mL

## 2012-12-23 MED ORDER — FENTANYL CITRATE 0.05 MG/ML IJ SOLN
INTRAMUSCULAR | Status: DC | PRN
  Administered 2012-12-23: 25 ug via INTRAVENOUS

## 2012-12-23 MED ORDER — CHLORHEXIDINE GLUCONATE 4 % EX LIQD: 60.0000 mL | Freq: Once | CUTANEOUS | Status: DC

## 2012-12-23 MED ORDER — OXYCODONE HCL 5 MG/5ML PO SOLN: 5.0000 mg | Freq: Once | ORAL | Status: DC | PRN

## 2012-12-23 MED ORDER — HYDROMORPHONE HCL PF 1 MG/ML IJ SOLN: 0.2500 mg | INTRAMUSCULAR | Status: DC | PRN

## 2012-12-23 MED ORDER — LACTATED RINGERS IV SOLN
INTRAVENOUS | Status: DC
  Administered 2012-12-23 (×2): via INTRAVENOUS

## 2012-12-23 MED ORDER — LIDOCAINE-EPINEPHRINE (PF) 1.5 %-1:200000 IJ SOLN
INTRAMUSCULAR | Status: DC | PRN
  Administered 2012-12-23: 25 mL

## 2012-12-23 MED ORDER — ENOXAPARIN SODIUM 40 MG/0.4ML ~~LOC~~ SOLN
40.0000 mg | SUBCUTANEOUS | Status: DC
  Administered 2012-12-23: 40 mg via SUBCUTANEOUS

## 2012-12-23 MED ORDER — DOCUSATE SODIUM 100 MG PO CAPS: 100.0000 mg | ORAL_CAPSULE | Freq: Two times a day (BID) | ORAL | Status: DC

## 2012-12-23 MED ORDER — OXYCODONE HCL 5 MG PO TABS: 5.0000 mg | ORAL_TABLET | ORAL | Status: DC | PRN

## 2012-12-23 MED ORDER — CEFAZOLIN SODIUM-DEXTROSE 2-3 GM-% IV SOLR
2.0000 g | INTRAVENOUS | Status: AC
  Administered 2012-12-23: 2 g via INTRAVENOUS
  Administered 2012-12-23: 1 g via INTRAVENOUS

## 2012-12-23 MED ORDER — HYDROMORPHONE HCL PF 1 MG/ML IJ SOLN
0.5000 mg | INTRAMUSCULAR | Status: DC | PRN
  Administered 2012-12-23 – 2012-12-24 (×2): 1 mg via INTRAVENOUS

## 2012-12-23 MED ORDER — METHYLDOPA 500 MG PO TABS
500.0000 mg | ORAL_TABLET | Freq: Three times a day (TID) | ORAL | Status: DC
Start: 2012-12-23 — End: 2012-12-24
  Administered 2012-12-23: 500 mg via ORAL

## 2012-12-23 MED ORDER — OXYCODONE HCL 5 MG PO TABS: 5.0000 mg | ORAL_TABLET | Freq: Once | ORAL | Status: DC | PRN

## 2012-12-23 MED ORDER — METHOCARBAMOL 500 MG PO TABS
500.0000 mg | ORAL_TABLET | Freq: Once | ORAL | Status: AC | PRN
  Administered 2012-12-23: 500 mg via ORAL

## 2012-12-23 MED ORDER — SODIUM CHLORIDE 0.9 % IV SOLN: INTRAVENOUS | Status: DC

## 2012-12-23 MED ORDER — MIDAZOLAM HCL 5 MG/5ML IJ SOLN
INTRAMUSCULAR | Status: DC | PRN
  Administered 2012-12-23: 1 mg via INTRAVENOUS

## 2012-12-23 MED ORDER — FENTANYL CITRATE 0.05 MG/ML IJ SOLN
50.0000 ug | INTRAMUSCULAR | Status: DC | PRN
  Administered 2012-12-23: 100 ug via INTRAVENOUS

## 2012-12-23 MED ORDER — LACTATED RINGERS IV SOLN
INTRAVENOUS | Status: DC
  Administered 2012-12-23: 100 mL/h via INTRAVENOUS

## 2012-12-23 SURGICAL SUPPLY — 111 items
ANCHOR SCREW TI W/SUT 3 (Screw) ×1 IMPLANT
BANDAGE ESMARK 6X9 LF (GAUZE/BANDAGES/DRESSINGS) ×1 IMPLANT
BIT DRILL 2 FAST STEP (BIT) ×1 IMPLANT
BLADE AVERAGE 25X9 (BLADE) ×1 IMPLANT
BLADE CCA MICRO SAG (BLADE) IMPLANT
BLADE MICRO SAGITTAL (BLADE) ×2 IMPLANT
BLADE SURG 11 STRL SS (BLADE) IMPLANT
BLADE SURG 15 STRL LF DISP TIS (BLADE) ×2 IMPLANT
BLADE SURG 15 STRL SS (BLADE) ×6
BNDG CMPR 9X6 STRL LF SNTH (GAUZE/BANDAGES/DRESSINGS) ×1
BNDG COHESIVE 4X5 TAN STRL (GAUZE/BANDAGES/DRESSINGS) ×2 IMPLANT
BNDG COHESIVE 6X5 TAN STRL LF (GAUZE/BANDAGES/DRESSINGS) ×2 IMPLANT
BNDG ESMARK 6X9 LF (GAUZE/BANDAGES/DRESSINGS) ×2
BUR EGG 3PK/BX (BURR) IMPLANT
CANISTER SUCTION 1200CC (MISCELLANEOUS) ×1 IMPLANT
CHLORAPREP W/TINT 26ML (MISCELLANEOUS) ×2 IMPLANT
CLOTH BEACON ORANGE TIMEOUT ST (SAFETY) ×2 IMPLANT
COVER TABLE BACK 60X90 (DRAPES) ×2 IMPLANT
CUFF TOURNIQUET SINGLE 34IN LL (TOURNIQUET CUFF) ×2 IMPLANT
DRAPE C-ARM 42X72 X-RAY (DRAPES) ×2 IMPLANT
DRAPE C-ARMOR (DRAPES) ×1 IMPLANT
DRAPE EXTREMITY T 121X128X90 (DRAPE) ×2 IMPLANT
DRAPE INCISE IOBAN 66X45 STRL (DRAPES) IMPLANT
DRAPE OEC MINIVIEW 54X84 (DRAPES) IMPLANT
DRAPE PED LAPAROTOMY (DRAPES) IMPLANT
DRAPE U 20/CS (DRAPES) IMPLANT
DRAPE U-SHAPE 47X51 STRL (DRAPES) ×2 IMPLANT
DRIVER BIT SQUARE 1.3MM (TRAUMA) ×1 IMPLANT
DRSG ADAPTIC 3X8 NADH LF (GAUZE/BANDAGES/DRESSINGS) ×1 IMPLANT
DRSG EMULSION OIL 3X3 NADH (GAUZE/BANDAGES/DRESSINGS) ×1 IMPLANT
DRSG PAD ABDOMINAL 8X10 ST (GAUZE/BANDAGES/DRESSINGS) ×4 IMPLANT
ELECT REM PT RETURN 9FT ADLT (ELECTROSURGICAL) ×2
ELECTRODE REM PT RTRN 9FT ADLT (ELECTROSURGICAL) ×1 IMPLANT
GAUZE SPONGE 4X4 16PLY XRAY LF (GAUZE/BANDAGES/DRESSINGS) IMPLANT
GLOVE BIO SURGEON STRL SZ8 (GLOVE) ×2 IMPLANT
GLOVE BIOGEL PI IND STRL 6.5 (GLOVE) IMPLANT
GLOVE BIOGEL PI IND STRL 8 (GLOVE) ×1 IMPLANT
GLOVE BIOGEL PI INDICATOR 6.5 (GLOVE) ×1
GLOVE BIOGEL PI INDICATOR 8 (GLOVE) ×1
GLOVE ECLIPSE 6.5 STRL STRAW (GLOVE) ×1 IMPLANT
GLOVE EXAM NITRILE MD LF STRL (GLOVE) ×1 IMPLANT
GOWN PREVENTION PLUS XLARGE (GOWN DISPOSABLE) ×2 IMPLANT
GOWN PREVENTION PLUS XXLARGE (GOWN DISPOSABLE) ×2 IMPLANT
K-WIRE ACE 1.6X6 (WIRE) ×2
K-WIRE FIXATION 2.0X6 (WIRE) ×4
KIT BIO-TENODESIS 3X8 DISP (MISCELLANEOUS)
KIT INSRT BABSR STRL DISP BTN (MISCELLANEOUS) IMPLANT
KWIRE ACE 1.6X6 (WIRE) IMPLANT
KWIRE FIXATION 2.0X6 (WIRE) IMPLANT
NDL HYPO 25X1 1.5 SAFETY (NEEDLE) IMPLANT
NDL SUT 6 .5 CRC .975X.05 MAYO (NEEDLE) IMPLANT
NEEDLE HYPO 22GX1.5 SAFETY (NEEDLE) ×1 IMPLANT
NEEDLE HYPO 25X1 1.5 SAFETY (NEEDLE) IMPLANT
NEEDLE MAYO TAPER (NEEDLE) ×2
NS IRRIG 1000ML POUR BTL (IV SOLUTION) ×2 IMPLANT
PACK BASIN DAY SURGERY FS (CUSTOM PROCEDURE TRAY) ×2 IMPLANT
PAD CAST 4YDX4 CTTN HI CHSV (CAST SUPPLIES) ×2 IMPLANT
PADDING CAST ABS 4INX4YD NS (CAST SUPPLIES)
PADDING CAST ABS COTTON 4X4 ST (CAST SUPPLIES) IMPLANT
PADDING CAST COTTON 4X4 STRL (CAST SUPPLIES) ×2
PADDING CAST COTTON 6X4 STRL (CAST SUPPLIES) ×2 IMPLANT
PENCIL BUTTON HOLSTER BLD 10FT (ELECTRODE) ×2 IMPLANT
PIN THREADED GUIDE ACE (PIN) ×1 IMPLANT
PLATE 2.5 SM F/IN-LINE FUSION (Plate) ×2 IMPLANT
SANITIZER HAND PURELL 535ML FO (MISCELLANEOUS) ×1 IMPLANT
SCREW CANC NONLOCK 2.5X26 (Screw) ×1 IMPLANT
SCREW CANN 6.5 50MM (Screw) ×2 IMPLANT
SCREW CANN 6.5 FLUT 50X22X (Screw) IMPLANT
SCREW MULTI DIRECT 20MM (Screw) ×1 IMPLANT
SCREW PEG 2.5X18 NONLOCK (Screw) ×1 IMPLANT
SCREW PEG 2.5X20 NONLOCK (Screw) ×1 IMPLANT
SCREW PEG 2.5X24 NONLOCK (Screw) ×1 IMPLANT
SCREW PEG LOCK 2.5X16 (Peg) ×2 IMPLANT
SCREW PEG LOCK 2.5X20 (Peg) ×1 IMPLANT
SCREW TENODSIS BIOCOMP 4.75X15 (Screw) ×1 IMPLANT
SHEET MEDIUM DRAPE 40X70 STRL (DRAPES) ×2 IMPLANT
SLEEVE SCD COMPRESS KNEE MED (MISCELLANEOUS) ×2 IMPLANT
SPLINT FAST PLASTER 5X30 (CAST SUPPLIES) ×20
SPLINT PLASTER CAST FAST 5X30 (CAST SUPPLIES) ×20 IMPLANT
SPONGE GAUZE 4X4 12PLY (GAUZE/BANDAGES/DRESSINGS) ×2 IMPLANT
SPONGE LAP 18X18 X RAY DECT (DISPOSABLE) ×2 IMPLANT
STOCKINETTE 6  STRL (DRAPES) ×1
STOCKINETTE 6 STRL (DRAPES) ×1 IMPLANT
STRIP CLOSURE SKIN 1/2X4 (GAUZE/BANDAGES/DRESSINGS) IMPLANT
SUCTION FRAZIER TIP 10 FR DISP (SUCTIONS) ×2 IMPLANT
SUT 2 FIBERLOOP 20 STRT BLUE (SUTURE)
SUT BONE WAX W31G (SUTURE) IMPLANT
SUT ETHIBOND 2 OS 4 DA (SUTURE) IMPLANT
SUT ETHILON 3 0 PS 1 (SUTURE) ×2 IMPLANT
SUT FIBERWIRE #2 38 T-5 BLUE (SUTURE)
SUT FIBERWIRE 2-0 18 17.9 3/8 (SUTURE)
SUT MNCRL AB 3-0 PS2 18 (SUTURE) ×2 IMPLANT
SUT VIC AB 0 SH 27 (SUTURE) ×1 IMPLANT
SUT VIC AB 2-0 PS2 27 (SUTURE) IMPLANT
SUT VIC AB 2-0 SH 18 (SUTURE) IMPLANT
SUT VIC AB 2-0 SH 27 (SUTURE)
SUT VIC AB 2-0 SH 27XBRD (SUTURE) IMPLANT
SUT VIC AB 3-0 PS1 18 (SUTURE)
SUT VIC AB 3-0 PS1 18XBRD (SUTURE) IMPLANT
SUTURE 2 FIBERLOOP 20 STRT BLU (SUTURE) IMPLANT
SUTURE FIBERWR #2 38 T-5 BLUE (SUTURE) IMPLANT
SUTURE FIBERWR 2-0 18 17.9 3/8 (SUTURE) IMPLANT
SYR BULB 3OZ (MISCELLANEOUS) ×2 IMPLANT
SYR CONTROL 10ML LL (SYRINGE) IMPLANT
TOWEL OR 17X24 6PK STRL BLUE (TOWEL DISPOSABLE) ×6 IMPLANT
TUBE CONNECTING 20X1/4 (TUBING) ×2 IMPLANT
UNDERPAD 30X30 INCONTINENT (UNDERPADS AND DIAPERS) ×2 IMPLANT
WEDGE AUG THK7X17.5X21X (Orthopedic Implant) IMPLANT
WEDGE OSSEOTI RECON PROF 1 (Orthopedic Implant) ×2 IMPLANT
WEDGE OSSEOTI RECON PROF 2 (Orthopedic Implant) ×1 IMPLANT
YANKAUER SUCT BULB TIP NO VENT (SUCTIONS) IMPLANT

## 2012-12-23 NOTE — H&P (Signed)
[name] is an 37 y.o. female.   Chief Complaint: right foot pain HPI: 37 y/o female with painful right flexible flatfoot deformity and a tight heelcord.  She has failed nonoperative treatment options and presents now for flatfoot reconstruction.  Past Medical History  Diagnosis Date  . Hypertension     Past Surgical History  Procedure Laterality Date  . Foot surgery    . Wisdom tooth extraction    . Dilation and curettage of uterus    . Cesarean section      x2    History reviewed. No pertinent family history. Social History:  reports that she has never smoked. She does not have any smokeless tobacco history on file. She reports that she does not drink alcohol or use illicit drugs.  Allergies:  Allergies  Allergen Reactions  . Sulfa Antibiotics     Medications Prior to Admission  Medication Sig Dispense Refill  . ibuprofen (ADVIL,MOTRIN) 200 MG tablet Take 800 mg by mouth 3 (three) times daily.      . methyldopa (ALDOMET) 500 MG tablet Take 500 mg by mouth 3 (three) times daily.      . Multiple Vitamin (MULTIVITAMINS PO) Take by mouth.        Results for orders placed during the hospital encounter of 12/23/12 (from the past 48 hour(s))  BASIC METABOLIC PANEL     Status: None   Collection Time    12/21/12  9:00 AM      Result Value Range   Sodium 140  135 - 145 mEq/L   Potassium 4.0  3.5 - 5.1 mEq/L   Chloride 104  96 - 112 mEq/L   CO2 29  19 - 32 mEq/L   Glucose, Bld 80  70 - 99 mg/dL   BUN 13  6 - 23 mg/dL   Creatinine, Ser 1.61  0.50 - 1.10 mg/dL   Calcium 9.0  8.4 - 09.6 mg/dL   GFR calc non Af Amer >90  >90 mL/min   GFR calc Af Amer >90  >90 mL/min   Comment:            The eGFR has been calculated     using the CKD EPI equation.     This calculation has not been     validated in all clinical     situations.     eGFR's persistently     <90 mL/min signify     possible Chronic Kidney Disease.  POCT HEMOGLOBIN-HEMACUE     Status: None   Collection  Time    12/23/12  7:00 AM      Result Value Range   Hemoglobin 13.2  12.0 - 15.0 g/dL   No results found.  ROS  No recent f/c/n/v/wt loss  Blood pressure 154/101, pulse 97, temperature 98.5 F (36.9 C), temperature source Oral, resp. rate 18, height 5\' 5"  (1.651 m), weight 77.837 kg (171 lb 9.6 oz), last menstrual period 12/11/2012, SpO2 100.00%. Physical Exam  wn wd woman in nad.  A and O.  Mood and affect normal.  EOMI.  Resp unlabored.  Right foot with planovalgus deformity.  Skin healthy and intact.  No lymhpadenopathy.  2+ dp and PT pulses.  Feels LT normally.   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  To OR for gastroc recession or PTAL, calc osteotomy, lateral column lengthening and post tib tenolysis v Kidner procedure.  She may also need Cotton osteotomy of the medial  cuneiform.  The risks and benefits of the alternative treatment options have been discussed in detail.  The patient wishes to proceed with surgery and specifically understands risks of bleeding, infection, nerve damage, blood clots, need for additional surgery, amputation and death.   Toni Arthurs 24-Dec-2012, 7:21 AM

## 2012-12-23 NOTE — Brief Op Note (Signed)
12/23/2012  11:42 AM  PATIENT:  Michiel Sites  36 y.o. female  PRE-OPERATIVE DIAGNOSIS:  RIGHT ADULT FLAT FOOT,  TIGHT HEEL CORD   POST-OPERATIVE DIAGNOSIS:  RIGHT ADULT FLAT FOOT,  TIGHT HEEL CORD   Procedure(s): 1.  Right gastrocsoleus recession 2.  Right medializing calcaneal osteotomy 3.  Right lateral column lengthening (osteotomy of anterior process of the calcaneus) 4.  Right flexor digitorum longus transfer to the navicular 5.  Right posterior tibial tendon advancement and excision of accessory navicular (kidner procedure) 6.  Right medial cuneiform plantar flexion osteotomy  SURGEON:  Toni Arthurs, MD  ASSISTANT: n/a  ANESTHESIA:   General, regional  EBL:  minimal   TOURNIQUET:   Total Tourniquet Time Documented: Thigh (Right) - 120 minutes Total: Thigh (Right) - 120 minutes   COMPLICATIONS:  None apparent  DISPOSITION:  Extubated, awake and stable to recovery.  DICTATION ID:  [id number]

## 2012-12-23 NOTE — Anesthesia Procedure Notes (Addendum)
Anesthesia Regional Block:  Popliteal block  Pre-Anesthetic Checklist: ,, timeout performed, Correct Patient, Correct Site, Correct Laterality, Correct Procedure, Correct Position, site marked, Risks and benefits discussed,  Surgical consent,  Pre-op evaluation,  At surgeon's request and post-op pain management  Laterality: Right  Prep: chloraprep       Needles:   Needle Type: Other     Needle Length: 9cm  Needle Gauge: 21    Additional Needles:  Procedures: ultrasound guided (picture in chart) Popliteal block Narrative:  Start time: 12/23/2012 7:04 AM End time: 12/23/2012 7:14 AM Injection made incrementally with aspirations every 5 mL.  Performed by: Personally  Anesthesiologist: Aldona Lento, MD  Additional Notes: Ultrasound guidance used to: id relevant anatomy, confirm needle position, local anesthetic spread, avoidance of vascular puncture. Picture saved. No complications. Block performed personally by Janetta Hora. Gelene Mink, MD  .    Popliteal block Procedure Name: LMA Insertion Date/Time: 12/23/2012 7:42 AM Performed by: Davanta Meuser D Pre-anesthesia Checklist: Patient identified, Emergency Drugs available, Suction available and Patient being monitored Patient Re-evaluated:Patient Re-evaluated prior to inductionOxygen Delivery Method: Circle System Utilized Preoxygenation: Pre-oxygenation with 100% oxygen Intubation Type: IV induction Ventilation: Mask ventilation without difficulty LMA: LMA inserted LMA Size: 4.0 Number of attempts: 1 Airway Equipment and Method: bite block Placement Confirmation: positive ETCO2 Tube secured with: Tape Dental Injury: Teeth and Oropharynx as per pre-operative assessment

## 2012-12-23 NOTE — Progress Notes (Signed)
Assisted Dr. Frederick with right, ultrasound guided, popliteal/saphenous block. Side rails up, monitors on throughout procedure. See vital signs in flow sheet. Tolerated Procedure well. 

## 2012-12-23 NOTE — Anesthesia Preprocedure Evaluation (Addendum)
Anesthesia Evaluation  Patient identified by MRN, date of birth, ID band Patient awake    Reviewed: Allergy & Precautions, H&P , NPO status , Patient's Chart, lab work & pertinent test results, reviewed documented beta Mazie Fencl date and time   Airway Mallampati: II TM Distance: >3 FB Neck ROM: full    Dental   Pulmonary neg pulmonary ROS,  breath sounds clear to auscultation        Cardiovascular hypertension, On Medications Rhythm:regular     Neuro/Psych negative neurological ROS  negative psych ROS   GI/Hepatic negative GI ROS, Neg liver ROS,   Endo/Other  negative endocrine ROS  Renal/GU negative Renal ROS  negative genitourinary   Musculoskeletal   Abdominal   Peds  Hematology negative hematology ROS (+)   Anesthesia Other Findings See surgeon's H&P   Reproductive/Obstetrics negative OB ROS                           Anesthesia Physical Anesthesia Plan  ASA: II  Anesthesia Plan: General   Post-op Pain Management:    Induction: Intravenous  Airway Management Planned: LMA  Additional Equipment:   Intra-op Plan:   Post-operative Plan: Extubation in OR  Informed Consent: I have reviewed the patients History and Physical, chart, labs and discussed the procedure including the risks, benefits and alternatives for the proposed anesthesia with the patient or authorized representative who has indicated his/her understanding and acceptance.   Dental Advisory Given  Plan Discussed with: CRNA and Surgeon  Anesthesia Plan Comments:         Anesthesia Quick Evaluation  

## 2012-12-23 NOTE — Anesthesia Postprocedure Evaluation (Signed)
Anesthesia Post Note  Patient: [name]  Procedure(s) Performed: Procedure(s) (LRB): RIGHT GASTRO RECESSION  (Right) RIGHT CALCANEAL OSTEOTOMY, RIGHT LATERAL COLUMN LENGTHENING, RIGHT COTTON OSTEOTOMY, RIGHT FLEX DIGITORIUM LONGUS TRANSFER, RIGHT KIDNER PROCEDURE  (Right)  Anesthesia type: General  Patient location: PACU  Post pain: Pain level controlled  Post assessment: Patient's Cardiovascular Status Stable  Last Vitals:  Filed Vitals:   12/23/12 1230  BP: 138/84  Pulse: 89  Temp:   Resp: 14    Post vital signs: Reviewed and stable  Level of consciousness: alert  Complications: No apparent anesthesia complications

## 2012-12-23 NOTE — Transfer of Care (Signed)
Immediate Anesthesia Transfer of Care Note  Patient: [name]  Procedure(s) Performed: Procedure(s): RIGHT GASTRO RECESSION  (Right) RIGHT CALCANEAL OSTEOTOMY, RIGHT LATERAL COLUMN LENGTHENING, RIGHT COTTON OSTEOTOMY, RIGHT FLEX DIGITORIUM LONGUS TRANSFER, RIGHT KIDNER PROCEDURE  (Right)  Patient Location: PACU  Anesthesia Type:GA combined with regional for post-op pain  Level of Consciousness: awake and patient cooperative  Airway & Oxygen Therapy: Patient Spontanous Breathing and Patient connected to face mask oxygen  Post-op Assessment: Report given to PACU RN and Post -op Vital signs reviewed and stable  Post vital signs: Reviewed and stable  Complications: No apparent anesthesia complications

## 2012-12-24 NOTE — Op Note (Signed)
[name], [name]               ACCOUNT NO.:  [id number]  MEDICAL RECORD NO.:  [id number]  LOCATION:                                 FACILITY:  PHYSICIAN:  Toni Arthurs, MD             DATE OF BIRTH:  DATE OF PROCEDURE:  12/23/2012 DATE OF DISCHARGE:                              OPERATIVE REPORT   PREOPERATIVE DIAGNOSES: 1. Right adult flatfoot deformity. 2. Painful right accessory navicular. 3. Right posterior tibial tendon dysfunction, stage IIB. 4. Right gastrocnemius contracture  (tight heel cord).  POSTOPERATIVE DIAGNOSES: 1. Right adult flatfoot deformity. 2. Painful right accessory navicular. 3. Right posterior tibial tendon dysfunction, stage IIB. 4. Right gastrocnemius contracture  (tight heel cord).  PROCEDURE: 1. Right gastroc soleus recession. 2. Right medializing calcaneal osteotomy. 3. Right lateral column lengthening (anterior process of the calcaneus     osteotomy). 4. Right flexor digitorum longus transfer to the navicular. 5. Right posterior tibial tendon advancement and excision of the     accessory navicular (Kidner procedure). 6. Right medial cuneiform plantar flexion osteotomy.  SURGEON:  Toni Arthurs, MD  ANESTHESIA:  General.  ESTIMATED BLOOD LOSS:  Minimal.  TOURNIQUET TIME:  Two hours at 220 mmHg.  COMPLICATIONS:  None apparent.  DISPOSITION:  Extubated, awake, and stable to recovery.  INDICATIONS FOR PROCEDURE:  The patient is a 37 year old woman with a painful right flatfoot deformity.  She has a significant gastrocnemius contracture, as well as accessory navicular that is quite large.  She has stage II posterior tibial tendon dysfunction.  She has failed nonoperative treatment with activity modification orthotics, anti- inflammatory medications, and shoe wear modifications.  She continues to have disabling pain and presents now for operative treatment of this complicated painful flatfoot deformity.  She understands the risks  and benefits, the alternative treatment options and elects surgical treatment.  She specifically understands risks of bleeding, infection, nerve damage, blood clots, need for additional surgery, amputation, and death.  PROCEDURE IN DETAIL:  After preoperative consent was obtained and the correct operative site was identified, the patient was brought to the operating room and placed supine on the operating table.  General anesthesia was induced.  Preoperative antibiotics were administered. Surgical time-out was taken.  The right lower extremity was prepped and draped in standard sterile fashion with a tourniquet around the thigh. The foot was exsanguinated and the tourniquet was inflated to 220 mmHg. A longitudinal incision was made at the medial aspect of the calf, sharp dissection was carried down through the skin and subcutaneous tissue. The superficial fascia was incised.  The gastrocnemius tendon was identified.  The sural nerve was protected posteriorly as the tendon was transected from medial to lateral in its entirety.  At this point, the ankle would dorsiflex about 10 degrees past neutral with the hindfoot held in a neutral position.  The wound was irrigated.  The incision was closed with inverted simple sutures of Monocryl and a running 3-0 nylon for the skin.  Attention was then turned to the lateral aspect of the hindfoot where an oblique incision was made over the lateral wall of the calcaneus.  Sharp dissection was  carried down through the skin and subcutaneous tissue taking care to protect the branches of the sural nerve.  The periosteum was incised.  The osteotomy was located using a lateral fluoroscopic image.  The osteotomy was then made with an oscillating saw.  The osteotomy site was mobilized and the tuberosity of the calcaneus was translated medially, approximately 1 cm.  A single 6.5 mm partially threaded screw was inserted across the osteotomy site and was noted  to compress it appropriately.  The lateral wall overhang was then flattened out by breaking up the cortical bone with a small osteotome and then impacting it, flush to the bone with an impactor.  The wound was irrigated.  Inverted simple sutures of 3-0 Monocryl were used to close the subcutaneous tissue and a running 3-0 nylon was used to close the skin incision.  Attention was then turned to the sinus tarsi area where an oblique incision was made.  Sharp dissection was carried down through the subcutaneous tissue in the interval between the extensor digitorum brevis and the peroneal tendons was identified and sharply released. The area just anterior to the angle of [place] was identified.  The osteotomy site was verified under lateral fluoroscopic imaging.  An oscillating saw was then used to make an osteotomy from the lateral wall of the calcaneus to the area of the medial facet.  This was opened gradually using a distractor and stacked osteotomes.  A trial wedge was placed in this osteotomy site.  An AP fluoroscopic image verified appropriate coverage of the talar head.  The trial wedge was removed and replaced with a OsseoTi wedge from [employer].  This was impacted into position and appropriate position was verified on AP and lateral fluoroscopic images.  A 4-hole locking plate was then selected and applied to the lateral wall of the calcaneus spanning the wedge.  It was fixed distally with a locking and a nonlocking screw proximally with locking and nonlocking screw.  The wound was irrigated.  The subcutaneous tissue was repaired over the wedge.  The EDB origin was repaired over the wedge and plate protecting the tendons.  Subcutaneous tissue was approximated with inverted simple sutures of 3-0 Monocryl and a running 3-0 nylon was used to close the skin incision.  Attention was then turned to the medial aspect of the hindfoot where a curvilinear incision was made over the posterior  tibial tendon sheath. The sheath was incised.  The tendon was released proximally and distally to the insertion on the navicular.  The accessory navicular was noted to be quite large and was also quite mobile.  It was sharply excised from the tendon leaving healthy medial navicular.  This cortical bone was then removed with a rongeur leaving a healthy bed of cancellous bone for later repair.  A guide pin was then inserted through the navicular tuberosity from dorsal to plantar.  The FDL tendon sheath was incised. The flexor digitorum longus was tracked down to its insertion at the knot of [place].  It was released at the insertion there.  It was mobilized and a whipstitch was inserted into the hand.  The position of the guide pin placed in the navicular was identified under fluoroscopy and confirmed to be appropriate.  The guide pin was over drilled with a 5 mm reamer and the tendon was pulled up through the hole and held in position temporarily.  At this point, a 3-mm titanium corkscrew anchor was inserted into the cancellous bone of the medial navicular for  later repair of the posterior tibial tendon.  The forefoot was then assessed. With the hindfoot held in reduced position, there was residual forefoot supination.  The decision was made at that point to proceed with a cotton osteotomy at the medial cuneiform.  The dorsal incision was made over the medial cuneiform.  The interval between the extensor hallucis longus, and the tibialis anterior was developed and the dorsal periosteum was incised.  The first tarsometatarsal joint was marked with a needle and the lateral view was obtained confirming appropriate position of the osteotomy site.  The osteotomy was then made with oscillating saw, taking care to protect the tibialis anterior and extensor hallucis longus tendons.  Again, stacked osteotomes were used to open the osteotomy site. AP and lateral fluoroscopic images  confirmed appropriate alignment of the arch.  The trial wedge was removed and replaced with another OsseoTi wedge.  The wedge was impacted into position and appropriate positioning was confirmed on AP and lateral fluoroscopic images.  At this point, again a 4-hole plate was positioned dorsally over the plate directly over the wedge.  It was fixed proximally and distally with locking and nonlocking screws.  The wound was irrigated copiously.  The final AP foot and lateral foot and Harris heel views were obtained showing appropriate position and length of all hardware and appropriate correction of the flatfoot deformity.  The flexor digitorum longus tendon was then tensioned appropriately and a 4.75 mm x 5 mm [employer] Bio-Tenodesis Screw was inserted and noted to have excellent purchase.  The posterior tibial tendon was then repaired back to the medial navicular using the previously placed 3 mm anchor. The posterior tibial tendon sheath was then repaired with 0 Vicryl sutures.  Subcutaneous tissue was approximated with inverted simple sutures of 3-0 Monocryl and a running 3-0 nylon was used to close the skin incision.  The dorsal incision was irrigated and then closed in similar fashion.  Sterile dressings were applied followed by a well- padded short-leg splint.  Tourniquet had been released at 2 hours and hemostasis was achieved prior to closure.  The patient was then awakened from anesthesia and transported to recovery room in stable condition.  FOLLOWUP PLAN:  The patient will be nonweightbearing on her right lower extremity.  She will follow up with me in 2 weeks for suture removal and conversion to a cast.  DVT prophylaxis will be with aspirin.  She will be observed overnight for pain control, and discharged home in the morning.     Toni Arthurs, MD     JH/MEDQ  D:  12/23/2012  T:  12/23/2012  Job:  [id number]

## 2012-12-27 ENCOUNTER — Encounter (HOSPITAL_BASED_OUTPATIENT_CLINIC_OR_DEPARTMENT_OTHER): Payer: Self-pay | Admitting: Orthopedic Surgery

## 2013-03-01 DIAGNOSIS — T8484XA Pain due to internal orthopedic prosthetic devices, implants and grafts, initial encounter: Secondary | ICD-10-CM

## 2013-03-01 HISTORY — DX: Pain due to internal orthopedic prosthetic devices, implants and grafts, initial encounter: T84.84XA

## 2013-03-17 ENCOUNTER — Encounter (HOSPITAL_BASED_OUTPATIENT_CLINIC_OR_DEPARTMENT_OTHER): Payer: Self-pay | Admitting: *Deleted

## 2013-03-23 ENCOUNTER — Other Ambulatory Visit: Payer: Self-pay | Admitting: Orthopedic Surgery

## 2013-03-24 ENCOUNTER — Ambulatory Visit (HOSPITAL_BASED_OUTPATIENT_CLINIC_OR_DEPARTMENT_OTHER): Payer: BC Managed Care – PPO | Admitting: Certified Registered Nurse Anesthetist

## 2013-03-24 ENCOUNTER — Ambulatory Visit (HOSPITAL_BASED_OUTPATIENT_CLINIC_OR_DEPARTMENT_OTHER)
Admission: RE | Admit: 2013-03-24 | Discharge: 2013-03-24 | Disposition: A | Discharge status: Home / Self Care | Payer: BC Managed Care – PPO | Source: Ambulatory Visit | Attending: Orthopedic Surgery | Admitting: Orthopedic Surgery

## 2013-03-24 ENCOUNTER — Encounter (HOSPITAL_BASED_OUTPATIENT_CLINIC_OR_DEPARTMENT_OTHER): Payer: Self-pay

## 2013-03-24 ENCOUNTER — Encounter (HOSPITAL_BASED_OUTPATIENT_CLINIC_OR_DEPARTMENT_OTHER): Payer: Self-pay | Admitting: Certified Registered Nurse Anesthetist

## 2013-03-24 ENCOUNTER — Encounter (HOSPITAL_BASED_OUTPATIENT_CLINIC_OR_DEPARTMENT_OTHER)
Admission: RE | Disposition: A | Discharge status: Home / Self Care | Payer: Self-pay | Source: Ambulatory Visit | Attending: Orthopedic Surgery

## 2013-03-24 DIAGNOSIS — T8484XD Pain due to internal orthopedic prosthetic devices, implants and grafts, subsequent encounter: Secondary | ICD-10-CM

## 2013-03-24 DIAGNOSIS — Z882 Allergy status to sulfonamides status: Secondary | ICD-10-CM | POA: Insufficient documentation

## 2013-03-24 DIAGNOSIS — K589 Irritable bowel syndrome without diarrhea: Secondary | ICD-10-CM | POA: Insufficient documentation

## 2013-03-24 DIAGNOSIS — Y831 Surgical operation with implant of artificial internal device as the cause of abnormal reaction of the patient, or of later complication, without mention of misadventure at the time of the procedure: Secondary | ICD-10-CM | POA: Insufficient documentation

## 2013-03-24 DIAGNOSIS — Y929 Unspecified place or not applicable: Secondary | ICD-10-CM | POA: Insufficient documentation

## 2013-03-24 DIAGNOSIS — T8489XA Other specified complication of internal orthopedic prosthetic devices, implants and grafts, initial encounter: Secondary | ICD-10-CM | POA: Insufficient documentation

## 2013-03-24 DIAGNOSIS — M19079 Primary osteoarthritis, unspecified ankle and foot: Secondary | ICD-10-CM | POA: Insufficient documentation

## 2013-03-24 DIAGNOSIS — Z79899 Other long term (current) drug therapy: Secondary | ICD-10-CM | POA: Insufficient documentation

## 2013-03-24 DIAGNOSIS — M79609 Pain in unspecified limb: Secondary | ICD-10-CM | POA: Insufficient documentation

## 2013-03-24 HISTORY — DX: Pain due to internal orthopedic prosthetic devices, implants and grafts, initial encounter: T84.84XA

## 2013-03-24 HISTORY — DX: Unspecified osteoarthritis, unspecified site: M19.90

## 2013-03-24 HISTORY — DX: Irritable bowel syndrome, unspecified: K58.9

## 2013-03-24 HISTORY — PX: HARDWARE REMOVAL: SHX979

## 2013-03-24 SURGERY — REMOVAL, HARDWARE
Anesthesia: General | Site: Foot | Laterality: Right | Wound class: Clean

## 2013-03-24 MED ORDER — DEXAMETHASONE SODIUM PHOSPHATE 10 MG/ML IJ SOLN
INTRAMUSCULAR | Status: DC | PRN
  Administered 2013-03-24: 10 mg via INTRAVENOUS

## 2013-03-24 MED ORDER — OXYCODONE HCL 5 MG/5ML PO SOLN: 5.0000 mg | Freq: Once | ORAL | Status: AC | PRN

## 2013-03-24 MED ORDER — SODIUM CHLORIDE 0.9 % IV SOLN: INTRAVENOUS | Status: DC

## 2013-03-24 MED ORDER — HYDROMORPHONE HCL PF 1 MG/ML IJ SOLN
0.2500 mg | INTRAMUSCULAR | Status: DC | PRN
  Administered 2013-03-24 (×4): 0.5 mg via INTRAVENOUS

## 2013-03-24 MED ORDER — 0.9 % SODIUM CHLORIDE (POUR BTL) OPTIME
TOPICAL | Status: DC | PRN
  Administered 2013-03-24: 100 mL

## 2013-03-24 MED ORDER — CEFAZOLIN SODIUM-DEXTROSE 2-3 GM-% IV SOLR
2.0000 g | INTRAVENOUS | Status: AC
  Administered 2013-03-24: 2 g via INTRAVENOUS

## 2013-03-24 MED ORDER — BUPIVACAINE-EPINEPHRINE 0.25% -1:200000 IJ SOLN
INTRAMUSCULAR | Status: DC | PRN
  Administered 2013-03-24: 7 mL

## 2013-03-24 MED ORDER — MIDAZOLAM HCL 2 MG/ML PO SYRP: 12.0000 mg | ORAL_SOLUTION | Freq: Once | ORAL | Status: DC | PRN

## 2013-03-24 MED ORDER — MIDAZOLAM HCL 5 MG/5ML IJ SOLN
INTRAMUSCULAR | Status: DC | PRN
  Administered 2013-03-24: 2 mg via INTRAVENOUS

## 2013-03-24 MED ORDER — LIDOCAINE HCL (CARDIAC) 20 MG/ML IV SOLN
INTRAVENOUS | Status: DC | PRN
  Administered 2013-03-24: 60 mg via INTRAVENOUS

## 2013-03-24 MED ORDER — MIDAZOLAM HCL 2 MG/2ML IJ SOLN: 1.0000 mg | INTRAMUSCULAR | Status: DC | PRN

## 2013-03-24 MED ORDER — FENTANYL CITRATE 0.05 MG/ML IJ SOLN
INTRAMUSCULAR | Status: DC | PRN
  Administered 2013-03-24: 50 ug via INTRAVENOUS

## 2013-03-24 MED ORDER — FENTANYL CITRATE 0.05 MG/ML IJ SOLN: 50.0000 ug | INTRAMUSCULAR | Status: DC | PRN

## 2013-03-24 MED ORDER — OXYCODONE HCL 5 MG PO TABS
5.0000 mg | ORAL_TABLET | Freq: Once | ORAL | Status: AC | PRN
  Administered 2013-03-24: 5 mg via ORAL

## 2013-03-24 MED ORDER — PROPOFOL 10 MG/ML IV BOLUS
INTRAVENOUS | Status: DC | PRN
  Administered 2013-03-24: 200 mg via INTRAVENOUS

## 2013-03-24 MED ORDER — CHLORHEXIDINE GLUCONATE 4 % EX LIQD: 60.0000 mL | Freq: Once | CUTANEOUS | Status: DC

## 2013-03-24 MED ORDER — ONDANSETRON HCL 4 MG/2ML IJ SOLN
INTRAMUSCULAR | Status: DC | PRN
  Administered 2013-03-24: 4 mg via INTRAVENOUS

## 2013-03-24 MED ORDER — LACTATED RINGERS IV SOLN
INTRAVENOUS | Status: DC
  Administered 2013-03-24: 13:00:00 via INTRAVENOUS
  Administered 2013-03-24: 20 mL/h via INTRAVENOUS
  Administered 2013-03-24: 12:00:00 via INTRAVENOUS

## 2013-03-24 MED ORDER — HYDROCODONE-ACETAMINOPHEN 5-325 MG PO TABS: 1.0000 | ORAL_TABLET | Freq: Four times a day (QID) | ORAL | Status: DC | PRN

## 2013-03-24 MED ORDER — BACITRACIN ZINC 500 UNIT/GM EX OINT
TOPICAL_OINTMENT | CUTANEOUS | Status: DC | PRN
  Administered 2013-03-24: 1 via TOPICAL

## 2013-03-24 MED ORDER — ONDANSETRON HCL 4 MG/2ML IJ SOLN: 4.0000 mg | Freq: Once | INTRAMUSCULAR | Status: DC | PRN

## 2013-03-24 SURGICAL SUPPLY — 71 items
BAG DECANTER FOR FLEXI CONT (MISCELLANEOUS) IMPLANT
BANDAGE ELASTIC 4 VELCRO ST LF (GAUZE/BANDAGES/DRESSINGS) IMPLANT
BANDAGE ESMARK 6X9 LF (GAUZE/BANDAGES/DRESSINGS) IMPLANT
BLADE SURG 15 STRL LF DISP TIS (BLADE) ×2 IMPLANT
BLADE SURG 15 STRL SS (BLADE) ×4
BNDG CMPR 9X4 STRL LF SNTH (GAUZE/BANDAGES/DRESSINGS) ×1
BNDG CMPR 9X6 STRL LF SNTH (GAUZE/BANDAGES/DRESSINGS)
BNDG COHESIVE 4X5 TAN STRL (GAUZE/BANDAGES/DRESSINGS) ×1 IMPLANT
BNDG COHESIVE 6X5 TAN STRL LF (GAUZE/BANDAGES/DRESSINGS) IMPLANT
BNDG ESMARK 4X9 LF (GAUZE/BANDAGES/DRESSINGS) ×1 IMPLANT
BNDG ESMARK 6X9 LF (GAUZE/BANDAGES/DRESSINGS)
CHLORAPREP W/TINT 26ML (MISCELLANEOUS) ×2 IMPLANT
CLOTH BEACON ORANGE TIMEOUT ST (SAFETY) ×2 IMPLANT
COVER TABLE BACK 60X90 (DRAPES) ×2 IMPLANT
CUFF TOURNIQUET SINGLE 34IN LL (TOURNIQUET CUFF) ×1 IMPLANT
DECANTER SPIKE VIAL GLASS SM (MISCELLANEOUS) IMPLANT
DRAPE EXTREMITY T 121X128X90 (DRAPE) ×2 IMPLANT
DRAPE OEC MINIVIEW 54X84 (DRAPES) ×2 IMPLANT
DRAPE SURG 17X23 STRL (DRAPES) IMPLANT
DRAPE U-SHAPE 47X51 STRL (DRAPES) ×2 IMPLANT
DRSG EMULSION OIL 3X3 NADH (GAUZE/BANDAGES/DRESSINGS) ×2 IMPLANT
DRSG PAD ABDOMINAL 8X10 ST (GAUZE/BANDAGES/DRESSINGS) IMPLANT
DRSG TEGADERM 4X4.75 (GAUZE/BANDAGES/DRESSINGS) IMPLANT
ELECT REM PT RETURN 9FT ADLT (ELECTROSURGICAL) ×2
ELECTRODE REM PT RTRN 9FT ADLT (ELECTROSURGICAL) ×1 IMPLANT
GLOVE BIO SURGEON STRL SZ 6.5 (GLOVE) ×1 IMPLANT
GLOVE BIO SURGEON STRL SZ8 (GLOVE) ×2 IMPLANT
GLOVE BIOGEL PI IND STRL 7.0 (GLOVE) IMPLANT
GLOVE BIOGEL PI IND STRL 8 (GLOVE) ×1 IMPLANT
GLOVE BIOGEL PI INDICATOR 7.0 (GLOVE) ×1
GLOVE BIOGEL PI INDICATOR 8 (GLOVE) ×1
GLOVE EXAM NITRILE MD LF STRL (GLOVE) ×1 IMPLANT
GOWN PREVENTION PLUS XLARGE (GOWN DISPOSABLE) ×2 IMPLANT
GOWN PREVENTION PLUS XXLARGE (GOWN DISPOSABLE) ×2 IMPLANT
NEEDLE HYPO 22GX1.5 SAFETY (NEEDLE) ×1 IMPLANT
PACK BASIN DAY SURGERY FS (CUSTOM PROCEDURE TRAY) ×2 IMPLANT
PAD CAST 4YDX4 CTTN HI CHSV (CAST SUPPLIES) ×1 IMPLANT
PADDING CAST ABS 4INX4YD NS (CAST SUPPLIES)
PADDING CAST ABS COTTON 4X4 ST (CAST SUPPLIES) IMPLANT
PADDING CAST COTTON 4X4 STRL (CAST SUPPLIES) ×2
PADDING CAST COTTON 6X4 STRL (CAST SUPPLIES) IMPLANT
PENCIL BUTTON HOLSTER BLD 10FT (ELECTRODE) ×1 IMPLANT
SANITIZER HAND PURELL 535ML FO (MISCELLANEOUS) ×2 IMPLANT
SHEET MEDIUM DRAPE 40X70 STRL (DRAPES) ×2 IMPLANT
SLEEVE SCD COMPRESS KNEE MED (MISCELLANEOUS) ×2 IMPLANT
SPLINT FAST PLASTER 5X30 (CAST SUPPLIES)
SPLINT PLASTER CAST FAST 5X30 (CAST SUPPLIES) IMPLANT
SPONGE GAUZE 4X4 12PLY (GAUZE/BANDAGES/DRESSINGS) ×2 IMPLANT
SPONGE LAP 18X18 X RAY DECT (DISPOSABLE) ×2 IMPLANT
STAPLER VISISTAT 35W (STAPLE) IMPLANT
STOCKINETTE 6  STRL (DRAPES) ×1
STOCKINETTE 6 STRL (DRAPES) ×1 IMPLANT
STRIP CLOSURE SKIN 1/2X4 (GAUZE/BANDAGES/DRESSINGS) IMPLANT
SUCTION FRAZIER TIP 10 FR DISP (SUCTIONS) IMPLANT
SUT ETHIBOND 0 MO6 C/R (SUTURE) IMPLANT
SUT ETHIBOND 2 OS 4 DA (SUTURE) IMPLANT
SUT ETHILON 3 0 PS 1 (SUTURE) IMPLANT
SUT MNCRL AB 3-0 PS2 18 (SUTURE) IMPLANT
SUT MNCRL AB 4-0 PS2 18 (SUTURE) IMPLANT
SUT VIC AB 0 CT1 27 (SUTURE)
SUT VIC AB 0 CT1 27XBRD ANBCTR (SUTURE) IMPLANT
SUT VIC AB 2-0 SH 18 (SUTURE) IMPLANT
SUT VIC AB 2-0 SH 27 (SUTURE)
SUT VIC AB 2-0 SH 27XBRD (SUTURE) IMPLANT
SUT VICRYL 4-0 PS2 18IN ABS (SUTURE) IMPLANT
SYR BULB 3OZ (MISCELLANEOUS) ×2 IMPLANT
SYR CONTROL 10ML LL (SYRINGE) ×1 IMPLANT
TOWEL OR 17X24 6PK STRL BLUE (TOWEL DISPOSABLE) ×2 IMPLANT
TOWEL OR NON WOVEN STRL DISP B (DISPOSABLE) ×2 IMPLANT
TUBE CONNECTING 20X1/4 (TUBING) IMPLANT
UNDERPAD 30X30 INCONTINENT (UNDERPADS AND DIAPERS) ×2 IMPLANT

## 2013-03-24 NOTE — H&P (Signed)
[name] is an 37 y.o. female.   Chief Complaint: right foot painful hardware HPI: 37 y/o female with painful screw at posterior Right heel.  She desires removal.  Past Medical History  Diagnosis Date  . Irritable bowel syndrome (IBS)     no current med.  . Arthritis     right foot  . Painful orthopaedic hardware 03/2013    right foot    Past Surgical History  Procedure Laterality Date  . Wisdom tooth extraction    . Cesarean section  07/13/2005; 06/03/2006  . Gastrocnemius recession Right 12/23/2012    Procedure: RIGHT GASTRO RECESSION ;  Surgeon: Toni Arthurs, MD;  Location: [facility];  Service: Orthopedics;  Laterality: Right;  . Calcaneal osteotomy Right 12/23/2012    Procedure: RIGHT CALCANEAL OSTEOTOMY, RIGHT LATERAL COLUMN LENGTHENING, RIGHT COTTON OSTEOTOMY, RIGHT FLEX DIGITORIUM LONGUS TRANSFER, RIGHT KIDNER PROCEDURE ;  Surgeon: Toni Arthurs, MD;  Location: [facility];  Service: Orthopedics;  Laterality: Right;  . Dilation and evacuation  08/16/2008  . Foot surgery Left 2002    History reviewed. No pertinent family history. Social History:  reports that she has never smoked. She has never used smokeless tobacco. She reports that  drinks alcohol. She reports that she does not use illicit drugs.  Allergies:  Allergies  Allergen Reactions  . Sulfa Antibiotics Other (See Comments)    MUSCLE ACHES    Medications Prior to Admission  Medication Sig Dispense Refill  . Multiple Vitamin (MULTIVITAMINS PO) Take by mouth.      . traMADol (ULTRAM) 50 MG tablet Take 50 mg by mouth every 6 (six) hours as needed for pain.        No results found for this or any previous visit (from the past 48 hour(s)). No results found.  ROS  No recent f/c/n/v/wt loss  Blood pressure 139/97, pulse 85, temperature 98.7 F (37.1 C), temperature source Oral, resp. rate 20, height 5\' 4"  (1.626 m), weight 77.928 kg (171 lb 12.8 oz), last menstrual period  03/02/2013, SpO2 100.00%. Physical Exam  wn wd woman in nad.  A and O.  Mood normal.  EOMI.  Resp unlabored.  R foot with healed surgical incision posteriorly.  SKin o/w healthy.  Normal Sens to LT.  5/5 strength in PF and DF of the ankle and foot.  Assessment/Plan Painful hardware right foot - to OR for removal.  The risks and benefits of the alternative treatment options have been discussed in detail.  The patient wishes to proceed with surgery and specifically understands risks of bleeding, infection, nerve damage, blood clots, need for additional surgery, amputation and death.   Toni Arthurs 04-15-2013, 12:24 PM

## 2013-03-24 NOTE — Brief Op Note (Signed)
03/24/2013  1:24 PM  PATIENT:  [name]  37 y.o. female  PRE-OPERATIVE DIAGNOSIS:  PAINFUL HARDWARE RIGHT FOOT  POST-OPERATIVE DIAGNOSIS:  same  Procedure(s): 1.  Removal of deep implant from the right foot 2.  AP and lateral radiographs of the right foot  SURGEON:  Toni Arthurs, MD  ASSISTANT: n/a  ANESTHESIA:   General  EBL:  minimal   TOURNIQUET:  n/a  COMPLICATIONS:  None apparent  DISPOSITION:  Extubated, awake and stable to recovery.  DICTATION ID:  [id number]

## 2013-03-24 NOTE — Transfer of Care (Signed)
Immediate Anesthesia Transfer of Care Note  Patient: [name]  Procedure(s) Performed: Procedure(s): HARDWARE REMOVAL RIGHT HEEL (Right)  Patient Location: PACU  Anesthesia Type:General  Level of Consciousness: awake and patient cooperative  Airway & Oxygen Therapy: Patient Spontanous Breathing and Patient connected to face mask oxygen  Post-op Assessment: Report given to PACU RN and Post -op Vital signs reviewed and stable  Post vital signs: Reviewed and stable  Complications: No apparent anesthesia complications

## 2013-03-24 NOTE — Discharge Instructions (Signed)
Toni Arthurs, MD [facility]  Please read the following information regarding your care after surgery.  Medications  You only need a prescription for the narcotic pain medicine (ex. oxycodone, Percocet, Norco).  All of the other medicines listed below are available over the counter. ? acetominophen (Tylenol) 650 mg every 4-6 hours as you need for minor pain ? oxycodone as prescribed for moderate to severe pain X norco as prescribed for pain  Weight Bearing X Bear weight when you are able on your operated leg or foot. ? Bear weight only on the heel of your operated foot in the post-op shoe. ? Do not bear any weight on the operated leg or foot.  Cast / Splint / Dressing ? Keep your splint or cast clean and dry.  Dont put anything (coat hanger, pencil, etc) down inside of it.  If it gets damp, use a hair dryer on the cool setting to dry it.  If it gets soaked, call the office to schedule an appointment for a cast change. X Remove your dressing 3 days after surgery and cover the incisions with dry dressings.    After your dressing, cast or splint is removed; you may shower, but do not soak or scrub the wound.  Allow the water to run over it, and then gently pat it dry.  Swelling It is normal for you to have swelling where you had surgery.  To reduce swelling and pain, keep your toes above your nose for at least 3 days after surgery.  It may be necessary to keep your foot or leg elevated for several weeks.  If it hurts, it should be elevated.  Follow Up Call my office at [phone] when you are discharged from the hospital or surgery center to schedule an appointment to be seen two weeks after surgery.  Call my office at [phone] if you develop a fever >101.5 F, nausea, vomiting, bleeding from the surgical site or severe pain.     Post Anesthesia Home Care Instructions  Activity: Get plenty of rest for the remainder of the day. A responsible adult should stay with you  for 24 hours following the procedure.  For the next 24 hours, DO NOT: -Drive a car -Advertising copywriter -Drink alcoholic beverages -Take any medication unless instructed by your physician -Make any legal decisions or sign important papers.  Meals: Start with liquid foods such as gelatin or soup. Progress to regular foods as tolerated. Avoid greasy, spicy, heavy foods. If nausea and/or vomiting occur, drink only clear liquids until the nausea and/or vomiting subsides. Call your physician if vomiting continues.  Special Instructions/Symptoms: Your throat may feel dry or sore from the anesthesia or the breathing tube placed in your throat during surgery. If this causes discomfort, gargle with warm salt water. The discomfort should disappear within 24 hours.

## 2013-03-24 NOTE — Anesthesia Procedure Notes (Signed)
Procedure Name: LMA Insertion Date/Time: 03/24/2013 12:42 PM Performed by: Karilyn Wind D Pre-anesthesia Checklist: Patient identified, Emergency Drugs available, Suction available and Patient being monitored Patient Re-evaluated:Patient Re-evaluated prior to inductionOxygen Delivery Method: Circle System Utilized Preoxygenation: Pre-oxygenation with 100% oxygen Intubation Type: IV induction Ventilation: Mask ventilation without difficulty LMA: LMA inserted LMA Size: 4.0 Number of attempts: 1 Airway Equipment and Method: bite block Placement Confirmation: positive ETCO2 Tube secured with: Tape Dental Injury: Teeth and Oropharynx as per pre-operative assessment

## 2013-03-24 NOTE — Anesthesia Preprocedure Evaluation (Signed)
Anesthesia Evaluation  Patient identified by MRN, date of birth, ID band Patient awake    Reviewed: Allergy & Precautions, H&P , NPO status , Patient's Chart, lab work & pertinent test results  Airway  TM Distance: >3 FB Neck ROM: Full    Dental  (+) Teeth Intact and Dental Advisory Given   Pulmonary  breath sounds clear to auscultation        Cardiovascular Rhythm:Regular Rate:Normal     Neuro/Psych    GI/Hepatic   Endo/Other    Renal/GU      Musculoskeletal   Abdominal   Peds  Hematology   Anesthesia Other Findings   Reproductive/Obstetrics                           Anesthesia Physical Anesthesia Plan  ASA: I  Anesthesia Plan: General   Post-op Pain Management:    Induction: Intravenous  Airway Management Planned: LMA  Additional Equipment:   Intra-op Plan:   Post-operative Plan: Extubation in OR  Informed Consent: I have reviewed the patients History and Physical, chart, labs and discussed the procedure including the risks, benefits and alternatives for the proposed anesthesia with the patient or authorized representative who has indicated his/her understanding and acceptance.   Dental advisory given  Plan Discussed with: CRNA, Anesthesiologist and Surgeon  Anesthesia Plan Comments:         Anesthesia Quick Evaluation

## 2013-03-24 NOTE — Anesthesia Postprocedure Evaluation (Signed)
  Anesthesia Post-op Note  Patient: [name]  Procedure(s) Performed: Procedure(s): HARDWARE REMOVAL RIGHT HEEL (Right)  Patient Location: PACU  Anesthesia Type:General  Level of Consciousness: awake  Airway and Oxygen Therapy: Patient Spontanous Breathing and Patient connected to face mask oxygen  Post-op Pain: none  Post-op Assessment: Post-op Vital signs reviewed  Post-op Vital Signs: Reviewed  Complications: No apparent anesthesia complications

## 2013-03-25 ENCOUNTER — Encounter (HOSPITAL_BASED_OUTPATIENT_CLINIC_OR_DEPARTMENT_OTHER): Payer: Self-pay | Admitting: Orthopedic Surgery

## 2013-03-25 LAB — POCT HEMOGLOBIN-HEMACUE: Hemoglobin: 14.5 g/dL (ref 12.0–15.0)

## 2013-03-25 NOTE — Op Note (Signed)
Doyle, Tammy               ACCOUNT NO.:  [id number]  MEDICAL RECORD NO.:  [id number]  LOCATION:                                 FACILITY:  PHYSICIAN:  Toni Arthurs, MD        DATE OF BIRTH:  [birth year]  DATE OF PROCEDURE:  03/24/2013 DATE OF DISCHARGE:                              OPERATIVE REPORT   PREOPERATIVE DIAGNOSIS:  Painful hardware, right foot.  POSTOPERATIVE DIAGNOSIS:  Painful hardware, right foot.  PROCEDURE: 1. Removal of deep implant from the right foot. 2. AP and lateral radiographs of the right foot.  SURGEON:  Toni Arthurs, MD  ANESTHESIA:  General.  ESTIMATED BLOOD LOSS:  Minimal.  TOURNIQUET TIME:  Zero.  COMPLICATIONS:  None apparent.  DISPOSITION:  Extubated, awake, and stable to recovery.  INDICATIONS FOR PROCEDURE:  The patient is a 37 year old woman, who is now several months out from flat foot reconstruction with a calcaneal osteotomy.  She complains of pain at the posterior aspect of the heel at the site of the posterior screw.  She has failed treatment with activity modification, oral anti-inflammatories, and shoe wear modification.  She presents now for removal of this screw.  She understands the risks and benefits of the alternative treatment options and elects surgical treatment.  She specifically understands risks of bleeding, infection, nerve damage, blood clots, need for additional surgery, amputation, and death.  PROCEDURE IN DETAIL:  After preoperative consent was obtained and the correct operative site was identified, the patient was brought to the operating room and placed supine on the operating table.  General anesthesia was induced.  Preoperative antibiotics were administered. Surgical time-out was taken.  The right lower extremity was prepped and draped in standard sterile fashion.  The patient's previous posterior heel incision was identified.  This incision was made and a guide pin was then inserted into the screw.   The screwdriver was then used to back the screw out in its entirety.  Wound was irrigated copiously.  Lateral and Harris heel fluoroscopic views were obtained showing complete removal of the screw.  The surgical incision was closed with a horizontal mattress suture of 3-0 nylon.  Sterile dressings were applied followed by compression wrap.  The patient was then awakened from anesthesia and transported to the recovery room in stable condition.  FOLLOWUP PLAN:  The patient will be weightbearing as tolerated on her right lower extremity.  She will follow up with me in 2 weeks for suture removal.  RADIOGRAPHS:  Lateral and Harris heel radiographs of the right foot were obtained intraoperatively today.  These show interval removal of the calcaneus screw.  No fracture dislocation is noted.  Her calcaneal osteotomy appears to be healed.     Toni Arthurs, MD     JH/MEDQ  D:  03/24/2013  T:  03/25/2013  Job:  [id number]

## 2013-11-17 ENCOUNTER — Other Ambulatory Visit: Payer: Self-pay | Admitting: Obstetrics and Gynecology

## 2014-02-22 ENCOUNTER — Telehealth: Payer: Self-pay | Admitting: Family Medicine

## 2014-02-22 ENCOUNTER — Encounter: Payer: Self-pay | Admitting: Family Medicine

## 2014-02-22 ENCOUNTER — Ambulatory Visit (INDEPENDENT_AMBULATORY_CARE_PROVIDER_SITE_OTHER): Payer: BC Managed Care – PPO | Admitting: Family Medicine

## 2014-02-22 VITALS — BP 170/110 | HR 78 | Temp 98.4°F | Resp 16 | Wt 173.1 lb

## 2014-02-22 DIAGNOSIS — I10 Essential (primary) hypertension: Secondary | ICD-10-CM | POA: Insufficient documentation

## 2014-02-22 DIAGNOSIS — H8110 Benign paroxysmal vertigo, unspecified ear: Secondary | ICD-10-CM | POA: Insufficient documentation

## 2014-02-22 MED ORDER — MECLIZINE HCL 50 MG PO TABS: 50.0000 mg | ORAL_TABLET | Freq: Three times a day (TID) | ORAL | Status: DC | PRN

## 2014-02-22 MED ORDER — LOSARTAN POTASSIUM 50 MG PO TABS: 50.0000 mg | ORAL_TABLET | Freq: Every day | ORAL | Status: DC

## 2014-02-22 NOTE — Progress Notes (Signed)
   Subjective:    Patient ID: [name], female    DOB: [birth year], 38 y.o.   MRN: [id number]  Dizziness Associated symptoms include headaches.  Headache  Associated symptoms include dizziness.   New to establish.  GYNVincente Poli- Grewal.  PCP- none  HTN- pt reports being 'in denial'.  Had HTN w/ last pregnancy- was on Mag and in the ICU.  Was previously on beta blocker but this cause HR to drop rather than BP.  2nd medication caused itchy scalp- name unknown.  + strong family hx.  Not a smoker.  Not considering more children.  + HAs, dizziness which is causing nausea.  Some visual changes.  No edema.  No CP, some SOB.  Dizziness- described as a spinning feeling.  Worse w/ moving head.  + nausea.  Has similar sxs when lying down.   Review of Systems  Neurological: Positive for dizziness and headaches.   For ROS see HPI     Objective:   Physical Exam  Vitals reviewed. Constitutional: She is oriented to person, place, and time. She appears well-developed and well-nourished. No distress.  HENT:  Head: Normocephalic and atraumatic.  Mouth/Throat: Uvula is midline and mucous membranes are normal.  TMs WNL No TTP over sinuses Minimal nasal congestion  Eyes: Conjunctivae and EOM are normal. Pupils are equal, round, and reactive to light.  2-3 beats of horizontal nystagmus  Neck: Normal range of motion. Neck supple.  Cardiovascular: Normal rate, regular rhythm, normal heart sounds and intact distal pulses.   Pulmonary/Chest: Effort normal and breath sounds normal. No respiratory distress. She has no wheezes. She has no rales.  Musculoskeletal: She exhibits no edema.  Lymphadenopathy:    She has no cervical adenopathy.  Neurological: She is alert and oriented to person, place, and time. She has normal reflexes. No cranial nerve deficit.  + Gilberto Betterix Hallpike  Skin: Skin is warm and dry.  Psychiatric: She has a normal mood and affect. Her behavior is normal. Judgment and thought content normal.           Assessment & Plan:

## 2014-02-22 NOTE — Telephone Encounter (Signed)
Relevant patient education assigned to patient using Emmi. ° °

## 2014-02-22 NOTE — Progress Notes (Signed)
Pre visit review using our clinic review tool, if applicable. No additional management support is needed unless otherwise documented below in the visit note. 

## 2014-02-22 NOTE — Patient Instructions (Signed)
Cancel your appt on Friday and f/u in 1 month to recheck BP We'll notify you of your lab results and make any changes if needed Start the Losartan daily for BP Use the Meclizine as needed for dizziness Drink plenty of fluids Change positions slowly Eat a low salt diet, get regular exercise, and limit caffeine to improve BP Call with any questions or concerns Hang in there!

## 2014-02-23 ENCOUNTER — Encounter: Payer: Self-pay | Admitting: General Practice

## 2014-02-23 LAB — BASIC METABOLIC PANEL
BUN: 9 mg/dL (ref 6–23)
CO2: 30 meq/L (ref 19–32)
Calcium: 9.2 mg/dL (ref 8.4–10.5)
Chloride: 105 mEq/L (ref 96–112)
Creatinine, Ser: 0.9 mg/dL (ref 0.4–1.2)
GFR: 94.54 mL/min (ref >60.00)
GLUCOSE: 65 mg/dL — AB (ref 70–99)
POTASSIUM: 4.2 meq/L (ref 3.5–5.1)
SODIUM: 140 meq/L (ref 135–145)

## 2014-02-23 NOTE — Assessment & Plan Note (Signed)
New.  Strong family hx, pt has hx of PIH.  Start Losartan daily.  Check BMP.  Reviewed supportive care and red flags that should prompt return.  Pt expressed understanding and is in agreement w/ plan.

## 2014-02-23 NOTE — Assessment & Plan Note (Signed)
New.  Reviewed dx w/ pt and importance of hydration and changing positions slowly.  Start Meclizine prn.  If no improvement, will need modified Eppley maneuver to desensitize her inner ears.  Reviewed supportive care and red flags that should prompt return.  Pt expressed understanding and is in agreement w/ plan.

## 2014-02-24 ENCOUNTER — Ambulatory Visit: Payer: BC Managed Care – PPO | Admitting: Family Medicine

## 2014-03-24 ENCOUNTER — Encounter: Payer: Self-pay | Admitting: General Practice

## 2014-03-24 ENCOUNTER — Ambulatory Visit (INDEPENDENT_AMBULATORY_CARE_PROVIDER_SITE_OTHER): Payer: BC Managed Care – PPO | Admitting: Family Medicine

## 2014-03-24 ENCOUNTER — Encounter: Payer: Self-pay | Admitting: Family Medicine

## 2014-03-24 VITALS — BP 124/80 | HR 69 | Temp 98.0°F | Resp 16 | Wt 174.4 lb

## 2014-03-24 DIAGNOSIS — I10 Essential (primary) hypertension: Secondary | ICD-10-CM

## 2014-03-24 LAB — BASIC METABOLIC PANEL
BUN: 10 mg/dL (ref 6–23)
CALCIUM: 9.1 mg/dL (ref 8.4–10.5)
CHLORIDE: 104 meq/L (ref 96–112)
CO2: 28 meq/L (ref 19–32)
CREATININE: 0.8 mg/dL (ref 0.4–1.2)
GFR: 110.66 mL/min (ref >60.00)
GLUCOSE: 81 mg/dL (ref 70–99)
Potassium: 4.2 mEq/L (ref 3.5–5.1)
Sodium: 137 mEq/L (ref 135–145)

## 2014-03-24 NOTE — Patient Instructions (Signed)
Schedule your complete physical in 6 months We'll let you know about your lab results and make any changes if needed Keep up the good work! Call with any questions or concerns Happy Early Birthday!!!

## 2014-03-24 NOTE — Progress Notes (Signed)
   Subjective:    Patient ID: [name], female    DOB: [birth year], 38 y.o.   MRN: [id number]  HPI HTN- new dx last visit.  Started on Losartan.  BP is exellent.  Denies CP, SOB, HAs, visual changes, edema.  Exercising regularly.   Review of Systems For ROS see HPI     Objective:   Physical Exam  Vitals reviewed. Constitutional: She is oriented to person, place, and time. She appears well-developed and well-nourished. No distress.  HENT:  Head: Normocephalic and atraumatic.  Eyes: Conjunctivae and EOM are normal. Pupils are equal, round, and reactive to light.  Neck: Normal range of motion. Neck supple. No thyromegaly present.  Cardiovascular: Normal rate, regular rhythm, normal heart sounds and intact distal pulses.   No murmur heard. Pulmonary/Chest: Effort normal and breath sounds normal. No respiratory distress.  Abdominal: Soft. She exhibits no distension. There is no tenderness.  Musculoskeletal: She exhibits no edema.  Lymphadenopathy:    She has no cervical adenopathy.  Neurological: She is alert and oriented to person, place, and time.  Skin: Skin is warm and dry.  Psychiatric: She has a normal mood and affect. Her behavior is normal.          Assessment & Plan:

## 2014-03-24 NOTE — Progress Notes (Signed)
Pre visit review using our clinic review tool, if applicable. No additional management support is needed unless otherwise documented below in the visit note. 

## 2014-03-26 NOTE — Assessment & Plan Note (Signed)
Much improved since starting BP meds.  Check BMP.  No anticipated changes.

## 2014-09-11 ENCOUNTER — Encounter: Payer: Self-pay | Admitting: Family Medicine

## 2014-09-11 ENCOUNTER — Ambulatory Visit (INDEPENDENT_AMBULATORY_CARE_PROVIDER_SITE_OTHER): Payer: 59 | Admitting: Family Medicine

## 2014-09-11 VITALS — BP 124/80 | HR 76 | Temp 98.3°F | Resp 16 | Wt 182.0 lb

## 2014-09-11 DIAGNOSIS — R319 Hematuria, unspecified: Secondary | ICD-10-CM

## 2014-09-11 DIAGNOSIS — M25552 Pain in left hip: Secondary | ICD-10-CM

## 2014-09-11 DIAGNOSIS — M5410 Radiculopathy, site unspecified: Secondary | ICD-10-CM

## 2014-09-11 DIAGNOSIS — M7071 Other bursitis of hip, right hip: Secondary | ICD-10-CM

## 2014-09-11 DIAGNOSIS — M7072 Other bursitis of hip, left hip: Secondary | ICD-10-CM

## 2014-09-11 DIAGNOSIS — M5440 Lumbago with sciatica, unspecified side: Secondary | ICD-10-CM

## 2014-09-11 LAB — POCT URINALYSIS DIPSTICK
BILIRUBIN UA: NEGATIVE
Blood, UA: 10
GLUCOSE UA: NEGATIVE
Ketones, UA: NEGATIVE
Leukocytes, UA: NEGATIVE
NITRITE UA: NEGATIVE
PH UA: 5.5
Protein, UA: 0.15
Spec Grav, UA: >=1.03
Urobilinogen, UA: 0.2

## 2014-09-11 MED ORDER — CYCLOBENZAPRINE HCL 10 MG PO TABS: 10.0000 mg | ORAL_TABLET | Freq: Three times a day (TID) | ORAL | Status: DC | PRN

## 2014-09-11 MED ORDER — PREDNISONE 10 MG PO TABS: ORAL_TABLET | ORAL | Status: DC

## 2014-09-11 NOTE — Progress Notes (Signed)
   Subjective:    Patient ID: [name], female    DOB: 23-Nov-1975, 39 y.o.   MRN: [id number]  HPI Low back pain- sxs started 12/18 w/ bilateral low back pain.  initially started as a burning sensation w/ TTP.  Pain is now described as an 'aching' and radiates into both hips and now L groin.  Pt does a lot of lifting at work (including pt's and beds).  No known injury.  No urinary sxs- frequency, urgency, dysuria.  No fevers.  No relief w/ tylenol/ibuprofen, heat.  Standing is worse.  No numbness or weakness.   Review of Systems For ROS see HPI     Objective:   Physical Exam  Constitutional: She is oriented to person, place, and time. She appears well-developed and well-nourished.  Obviously uncomfortable w/ movement  HENT:  Head: Normocephalic and atraumatic.  Cardiovascular: Intact distal pulses.   Abdominal: Soft. Bowel sounds are normal. She exhibits no distension. There is no tenderness. There is no rebound and no guarding.  Musculoskeletal:  + TTP over SI joints bilaterally + R contralateral SLR + TTP over greater trochanteric bursa bilaterally  Neurological: She is alert and oriented to person, place, and time. She has normal reflexes. No cranial nerve deficit. Coordination normal.  Vitals reviewed.         Assessment & Plan:

## 2014-09-11 NOTE — Assessment & Plan Note (Signed)
New.  Start pred taper.  Ice.  Reviewed supportive care and red flags that should prompt return.  Pt expressed understanding and is in agreement w/ plan.

## 2014-09-11 NOTE — Assessment & Plan Note (Signed)
New.  Start pred taper.  Flexeril prn.  Heat prn.  If no improvement will need ortho referral.  Reviewed supportive care and red flags that should prompt return.  Pt expressed understanding and is in agreement w/ plan.

## 2014-09-11 NOTE — Progress Notes (Signed)
Pre visit review using our clinic review tool, if applicable. No additional management support is needed unless otherwise documented below in the visit note. 

## 2014-09-11 NOTE — Patient Instructions (Signed)
Follow up by phone in 10-14 days so we can send you to ortho if needed Start the Pred taper as directed- take w/ food Use the flexeril for nights/weekends- will cause drowsiness ICE the hips, HEAT the back Call with any questions or concerns Happy New Year!!!

## 2014-09-12 LAB — URINE CULTURE

## 2014-09-21 ENCOUNTER — Encounter: Payer: BC Managed Care – PPO | Admitting: Family Medicine

## 2014-09-29 ENCOUNTER — Ambulatory Visit (INDEPENDENT_AMBULATORY_CARE_PROVIDER_SITE_OTHER): Payer: 59 | Admitting: Family Medicine

## 2014-09-29 ENCOUNTER — Encounter: Payer: Self-pay | Admitting: Family Medicine

## 2014-09-29 VITALS — BP 112/78 | HR 84 | Temp 97.8°F | Resp 16 | Wt 180.4 lb

## 2014-09-29 DIAGNOSIS — M25552 Pain in left hip: Secondary | ICD-10-CM | POA: Insufficient documentation

## 2014-09-29 DIAGNOSIS — M5410 Radiculopathy, site unspecified: Secondary | ICD-10-CM

## 2014-09-29 MED ORDER — MELOXICAM 15 MG PO TABS: 15.0000 mg | ORAL_TABLET | Freq: Every day | ORAL | Status: DC

## 2014-09-29 NOTE — Assessment & Plan Note (Signed)
New.  Start daily Mobic for inflammation.  Refer to ortho for complete evaluation and tx.

## 2014-09-29 NOTE — Assessment & Plan Note (Signed)
Ongoing.  Improved w/ pred taper but returned once meds were complete.  Start daily Mobic.  Refer to ortho.  Reviewed supportive care and red flags that should prompt return.  Pt expressed understanding and is in agreement w/ plan.

## 2014-09-29 NOTE — Progress Notes (Signed)
Pre visit review using our clinic review tool, if applicable. No additional management support is needed unless otherwise documented below in the visit note. 

## 2014-09-29 NOTE — Patient Instructions (Signed)
Follow up as needed Start the Mobic daily w/ food for inflammation Vit D, Fish Oil, and Tumeric for joint pain Use tylenol for breakthrough pain Ice the hip, heat the back We'll call you with your ortho appt Call with any questions or concerns Hang in there!!!

## 2014-09-29 NOTE — Progress Notes (Signed)
   Subjective:    Patient ID: [name], female    DOB: [birth year], 39 y.o.   MRN: [id number]  HPI Back pain- sxs resolved w/ prednisone.  After pred taper ended sxs returned.  No difficulty w/ sitting or lying, only pain w/ bending, standing, walking, stooping.  L hip/groin pain is predominate but still has some back pain.  Minimal discomfort on R.  No numbness/tingling/weakness.  No bowel or bladder incontinence.   Review of Systems For ROS see HPI     Objective:   Physical Exam  Constitutional: She is oriented to person, place, and time. She appears well-developed and well-nourished. No distress.  Cardiovascular: Intact distal pulses.   Musculoskeletal: She exhibits tenderness (over L femoral head/groin, over L greater trochanteric bursa, over L SI joint).  Pain w/ flexion of L hip, pain w/ external rotation, no pain w/ internal rotation  Neurological: She is alert and oriented to person, place, and time. She has normal reflexes. No cranial nerve deficit. Coordination normal.  Skin: Skin is warm and dry.  Vitals reviewed.         Assessment & Plan:

## 2014-10-02 ENCOUNTER — Other Ambulatory Visit (HOSPITAL_COMMUNITY): Payer: Self-pay | Admitting: Sports Medicine

## 2014-10-02 DIAGNOSIS — IMO0002 Reserved for concepts with insufficient information to code with codable children: Secondary | ICD-10-CM

## 2014-10-02 DIAGNOSIS — M545 Low back pain: Secondary | ICD-10-CM

## 2014-10-11 ENCOUNTER — Ambulatory Visit (HOSPITAL_COMMUNITY)
Admission: RE | Admit: 2014-10-11 | Discharge: 2014-10-11 | Disposition: A | Discharge status: Home / Self Care | Payer: 59 | Source: Ambulatory Visit | Attending: Sports Medicine | Admitting: Sports Medicine

## 2014-10-11 DIAGNOSIS — M545 Low back pain: Secondary | ICD-10-CM | POA: Diagnosis present

## 2014-10-11 DIAGNOSIS — R938 Abnormal findings on diagnostic imaging of other specified body structures: Secondary | ICD-10-CM | POA: Insufficient documentation

## 2014-10-11 DIAGNOSIS — IMO0002 Reserved for concepts with insufficient information to code with codable children: Secondary | ICD-10-CM

## 2014-11-28 ENCOUNTER — Telehealth: Payer: Self-pay | Admitting: Family Medicine

## 2014-11-28 NOTE — Telephone Encounter (Signed)
Pre visit letter sent  °

## 2014-12-04 ENCOUNTER — Other Ambulatory Visit: Payer: Self-pay

## 2014-12-05 LAB — CYTOLOGY - PAP

## 2014-12-14 ENCOUNTER — Telehealth: Payer: Self-pay | Admitting: *Deleted

## 2014-12-14 NOTE — Telephone Encounter (Signed)
LMOVM pre visit .. 

## 2014-12-15 ENCOUNTER — Ambulatory Visit (INDEPENDENT_AMBULATORY_CARE_PROVIDER_SITE_OTHER): Payer: 59 | Admitting: Family Medicine

## 2014-12-15 ENCOUNTER — Encounter: Payer: Self-pay | Admitting: Family Medicine

## 2014-12-15 VITALS — BP 120/78 | HR 85 | Temp 98.1°F | Resp 16 | Ht 65.0 in | Wt 183.4 lb

## 2014-12-15 DIAGNOSIS — Z0000 Encounter for general adult medical examination without abnormal findings: Secondary | ICD-10-CM | POA: Insufficient documentation

## 2014-12-15 LAB — CBC WITH DIFFERENTIAL/PLATELET
BASOS ABS: 0.1 10*3/uL (ref 0.0–0.1)
BASOS PCT: 2 % — AB (ref 0–1)
EOS ABS: 0.2 10*3/uL (ref 0.0–0.7)
EOS PCT: 5 % (ref 0–5)
HEMATOCRIT: 38.5 % (ref 36.0–46.0)
Hemoglobin: 13 g/dL (ref 12.0–15.0)
Lymphocytes Relative: 29 % (ref 12–46)
Lymphs Abs: 1.2 10*3/uL (ref 0.7–4.0)
MCH: 29.5 pg (ref 26.0–34.0)
MCHC: 33.8 g/dL (ref 30.0–36.0)
MCV: 87.3 fL (ref 78.0–100.0)
MONO ABS: 0.3 10*3/uL (ref 0.1–1.0)
MONOS PCT: 8 % (ref 3–12)
MPV: 9.9 fL (ref 8.6–12.4)
NEUTROS ABS: 2.4 10*3/uL (ref 1.7–7.7)
Neutrophils Relative %: 56 % (ref 43–77)
PLATELETS: 368 10*3/uL (ref 150–400)
RBC: 4.41 MIL/uL (ref 3.87–5.11)
RDW: 13.4 % (ref 11.5–15.5)
WBC: 4.3 10*3/uL (ref 4.0–10.5)

## 2014-12-15 LAB — TSH: TSH: 0.952 u[IU]/mL (ref 0.350–4.500)

## 2014-12-15 NOTE — Patient Instructions (Signed)
Follow up in 6 months to recheck BP We'll notify you of your lab results and make any changes if needed Keep up the good work!  You look great! Call with any questions or concerns Happy Spring! 

## 2014-12-15 NOTE — Progress Notes (Signed)
Pre visit review using our clinic review tool, if applicable. No additional management support is needed unless otherwise documented below in the visit note. 

## 2014-12-15 NOTE — Assessment & Plan Note (Signed)
Pt's PE WNL.  UTD on GYN.  Check labs.  Anticipatory guidance provided.  

## 2014-12-15 NOTE — Progress Notes (Signed)
   Subjective:    Patient ID: [name], female    DOB: [birth year], 39 y.o.   MRN: [id number]  HPI CPE- UTD on GYN.   Review of Systems Patient reports no vision/ hearing changes, adenopathy,fever, weight change,  persistant/recurrent hoarseness , swallowing issues, chest pain, palpitations, edema, persistant/recurrent cough, hemoptysis, dyspnea (rest/exertional/paroxysmal nocturnal), gastrointestinal bleeding (melena, rectal bleeding), abdominal pain, significant heartburn, bowel changes, GU symptoms (dysuria, hematuria, incontinence), Gyn symptoms (abnormal  bleeding, pain),  syncope, focal weakness, memory loss, numbness & tingling, skin/hair/nail changes, abnormal bruising or bleeding, anxiety, or depression.     Objective:   Physical Exam General Appearance:    Alert, cooperative, no distress, appears stated age  Head:    Normocephalic, without obvious abnormality, atraumatic  Eyes:    PERRL, conjunctiva/corneas clear, EOM's intact, fundi    benign, both eyes  Ears:    Normal TM's and external ear canals, both ears  Nose:   Nares normal, septum midline, mucosa normal, no drainage    or sinus tenderness  Throat:   Lips, mucosa, and tongue normal; teeth and gums normal  Neck:   Supple, symmetrical, trachea midline, no adenopathy;    Thyroid: no enlargement/tenderness/nodules  Back:     Symmetric, no curvature, ROM normal, no CVA tenderness  Lungs:     Clear to auscultation bilaterally, respirations unlabored  Chest Wall:    No tenderness or deformity   Heart:    Regular rate and rhythm, S1 and S2 normal, no murmur, rub   or gallop  Breast Exam:    Deferred to GYN  Abdomen:     Soft, non-tender, bowel sounds active all four quadrants,    no masses, no organomegaly  Genitalia:    Deferred to GYN  Rectal:    Extremities:   Extremities normal, atraumatic, no cyanosis or edema  Pulses:   2+ and symmetric all extremities  Skin:   Skin color, texture, turgor normal, no rashes or  lesions  Lymph nodes:   Cervical, supraclavicular, and axillary nodes normal  Neurologic:   CNII-XII intact, normal strength, sensation and reflexes    throughout          Assessment & Plan:

## 2014-12-16 LAB — LIPID PANEL
Cholesterol: 137 mg/dL (ref 0–200)
HDL: 41 mg/dL — ABNORMAL LOW (ref >=46)
LDL CALC: 85 mg/dL (ref 0–99)
TRIGLYCERIDES: 55 mg/dL (ref <150)
Total CHOL/HDL Ratio: 3.3 Ratio
VLDL: 11 mg/dL (ref 0–40)

## 2014-12-16 LAB — BASIC METABOLIC PANEL
BUN: 12 mg/dL (ref 6–23)
CHLORIDE: 104 meq/L (ref 96–112)
CO2: 28 mEq/L (ref 19–32)
CREATININE: 0.83 mg/dL (ref 0.50–1.10)
Calcium: 8.9 mg/dL (ref 8.4–10.5)
Glucose, Bld: 67 mg/dL — ABNORMAL LOW (ref 70–99)
Potassium: 3.6 mEq/L (ref 3.5–5.3)
Sodium: 139 mEq/L (ref 135–145)

## 2014-12-16 LAB — HEPATIC FUNCTION PANEL
ALT: 12 U/L (ref 0–35)
AST: 14 U/L (ref 0–37)
Albumin: 4.1 g/dL (ref 3.5–5.2)
Alkaline Phosphatase: 54 U/L (ref 39–117)
BILIRUBIN DIRECT: 0.1 mg/dL (ref 0.0–0.3)
BILIRUBIN TOTAL: 0.5 mg/dL (ref 0.2–1.2)
Indirect Bilirubin: 0.4 mg/dL (ref 0.2–1.2)
Total Protein: 6.9 g/dL (ref 6.0–8.3)

## 2014-12-16 LAB — VITAMIN D 25 HYDROXY (VIT D DEFICIENCY, FRACTURES): VIT D 25 HYDROXY: 14 ng/mL — AB (ref 30–100)

## 2014-12-18 ENCOUNTER — Other Ambulatory Visit: Payer: Self-pay | Admitting: General Practice

## 2014-12-18 MED ORDER — VITAMIN D (ERGOCALCIFEROL) 1.25 MG (50000 UNIT) PO CAPS: 50000.0000 [IU] | ORAL_CAPSULE | ORAL | Status: DC

## 2014-12-20 ENCOUNTER — Other Ambulatory Visit (HOSPITAL_COMMUNITY): Payer: Self-pay | Admitting: Orthopaedic Surgery

## 2014-12-20 DIAGNOSIS — M25552 Pain in left hip: Secondary | ICD-10-CM

## 2014-12-26 ENCOUNTER — Ambulatory Visit (HOSPITAL_COMMUNITY)
Admission: RE | Admit: 2014-12-26 | Discharge: 2014-12-26 | Disposition: A | Discharge status: Home / Self Care | Payer: 59 | Source: Ambulatory Visit | Attending: Orthopaedic Surgery | Admitting: Orthopaedic Surgery

## 2014-12-26 DIAGNOSIS — M25552 Pain in left hip: Secondary | ICD-10-CM | POA: Diagnosis present

## 2015-05-01 ENCOUNTER — Telehealth: Payer: Self-pay | Admitting: Family Medicine

## 2015-05-01 MED ORDER — LOSARTAN POTASSIUM 50 MG PO TABS: 50.0000 mg | ORAL_TABLET | Freq: Every day | ORAL | Status: DC

## 2015-05-01 NOTE — Telephone Encounter (Signed)
Relation to MV:HQIO Call back [phone] Pharmacy: [facility]   Reason for call:  Patient requesting a refill losartan (COZAAR) 50 MG tablet

## 2015-05-01 NOTE — Telephone Encounter (Signed)
Medication filled to pharmacy as requested.   

## 2015-05-10 MED ORDER — LOSARTAN POTASSIUM 50 MG PO TABS: 50.0000 mg | ORAL_TABLET | Freq: Every day | ORAL | Status: DC

## 2015-05-10 NOTE — Telephone Encounter (Signed)
Please resend losartan to [facility] on [facility]

## 2015-05-10 NOTE — Addendum Note (Signed)
Addended by: Geannie Risen on: 05/10/2015 09:58 AM   Modules accepted: Orders

## 2015-05-10 NOTE — Telephone Encounter (Signed)
Medication filled to pharmacy as requested.   

## 2015-05-10 NOTE — Telephone Encounter (Signed)
Pt has been out a few days

## 2015-05-28 ENCOUNTER — Encounter: Payer: Self-pay | Admitting: Family

## 2015-05-28 ENCOUNTER — Ambulatory Visit (INDEPENDENT_AMBULATORY_CARE_PROVIDER_SITE_OTHER): Payer: 59 | Admitting: Family

## 2015-05-28 VITALS — BP 130/70 | HR 81 | Temp 99.0°F | Resp 16 | Ht 65.0 in | Wt 186.6 lb

## 2015-05-28 DIAGNOSIS — J40 Bronchitis, not specified as acute or chronic: Secondary | ICD-10-CM

## 2015-05-28 DIAGNOSIS — J209 Acute bronchitis, unspecified: Secondary | ICD-10-CM

## 2015-05-28 MED ORDER — ALBUTEROL SULFATE (2.5 MG/3ML) 0.083% IN NEBU
2.5000 mg | INHALATION_SOLUTION | Freq: Once | RESPIRATORY_TRACT | Status: AC
  Administered 2015-05-28: 2.5 mg via RESPIRATORY_TRACT

## 2015-05-28 MED ORDER — HYDROCOD POLST-CPM POLST ER 10-8 MG/5ML PO SUER: 5.0000 mL | Freq: Every evening | ORAL | Status: DC | PRN

## 2015-05-28 MED ORDER — ALBUTEROL SULFATE HFA 108 (90 BASE) MCG/ACT IN AERS
2.0000 | INHALATION_SPRAY | Freq: Four times a day (QID) | RESPIRATORY_TRACT | Status: DC | PRN
Start: 2015-05-28 — End: 2016-03-20

## 2015-05-28 MED ORDER — AZITHROMYCIN 250 MG PO TABS
ORAL_TABLET | ORAL | Status: DC
Start: 2015-05-28 — End: 2015-06-18

## 2015-05-28 NOTE — Progress Notes (Signed)
Subjective:    Patient ID: [name], female    DOB: 1975/10/02, 39 y.o.   MRN: [id number]  HPI  [name] is a 39 yr old female who presents today with chief complaint of cough. Cough began 2 weeks ago. Pt thought that symptoms were allergy related.  Started claritin without improvement, then tried robitussin without improvement.  No known fever.  She denies asthma hx.  Reports that her son's have asthma. She checked her pulse ox on Saturday and her oxygen was down to 92%.  Reports + associated nasal drainage clear to yellow- this has been present x 2 week. Denies sinus pressure.  Not sleeping well due to cough (cough is worse at night).     Review of Systems See HPI  Past Medical History  Diagnosis Date  . Irritable bowel syndrome (IBS)     no current med.  . Arthritis     right foot  . Painful orthopaedic hardware 03/2013    right foot  . Hypertension     Social History   Social History  . Marital Status: Married    Spouse Name: N/A  . Number of Children: N/A  . Years of Education: N/A   Occupational History  . Not on file.   Social History Main Topics  . Smoking status: Never Smoker   . Smokeless tobacco: Never Used  . Alcohol Use: Yes     Comment: socially  . Drug Use: No  . Sexual Activity: Yes    Birth Control/ Protection: IUD   Other Topics Concern  . Not on file   Social History Narrative    Past Surgical History  Procedure Laterality Date  . Wisdom tooth extraction    . Cesarean section  07/13/2005; 06/03/2006  . Gastrocnemius recession Right 12/23/2012    Procedure: RIGHT GASTRO RECESSION ;  Surgeon: Toni Arthurs, MD;  Location: [facility];  Service: Orthopedics;  Laterality: Right;  . Calcaneal osteotomy Right 12/23/2012    Procedure: RIGHT CALCANEAL OSTEOTOMY, RIGHT LATERAL COLUMN LENGTHENING, RIGHT COTTON OSTEOTOMY, RIGHT FLEX DIGITORIUM LONGUS TRANSFER, RIGHT KIDNER PROCEDURE ;  Surgeon: Toni Arthurs, MD;  Location: [facility];  Service: Orthopedics;  Laterality: Right;  . Dilation and evacuation  08/16/2008  . Foot surgery Left 2002  . Hardware removal Right 03/24/2013    Procedure: HARDWARE REMOVAL RIGHT HEEL;  Surgeon: Toni Arthurs, MD;  Location: [facility];  Service: Orthopedics;  Laterality: Right;    Family History  Problem Relation Age of Onset  . Hypertension Mother   . Hypertension Father   . Diabetes Father     Allergies  Allergen Reactions  . Sulfa Antibiotics Other (See Comments)    MUSCLE ACHES    Current Outpatient Prescriptions on File Prior to Visit  Medication Sig Dispense Refill  . losartan (COZAAR) 50 MG tablet Take 1 tablet (50 mg total) by mouth daily. 30 tablet 6  . meclizine (ANTIVERT) 50 MG tablet Take 1 tablet (50 mg total) by mouth 3 (three) times daily as needed for dizziness. 45 tablet 0   No current facility-administered medications on file prior to visit.    BP 130/70 mmHg  Pulse 81  Temp(Src) 99 F (37.2 C) (Oral)  Resp 16  Ht  (1.651 m)  Wt 186 lb 9.6 oz (84.641 kg)  BMI 31.05 kg/m2  SpO2 98%  LMP 05/12/2015       Objective:   Physical Exam  Constitutional: She is  oriented to person, place, and time. She appears well-developed and well-nourished. No distress.  HENT:  Head: Normocephalic and atraumatic.  Right Ear: Tympanic membrane and ear canal normal.  Left Ear: Tympanic membrane and ear canal normal.  Mouth/Throat: No oropharyngeal exudate, posterior oropharyngeal edema or posterior oropharyngeal erythema.  Cardiovascular: Normal rate and regular rhythm.   No murmur heard. Pulmonary/Chest: Effort normal and breath sounds normal. No respiratory distress. She has no wheezes. She has no rales.  Musculoskeletal: She exhibits no edema.  Lymphadenopathy:    She has no cervical adenopathy.  Neurological: She is alert and oriented to person, place, and time.  Psychiatric: She has a normal mood and affect. Her behavior is  normal. Judgment and thought content normal.          Assessment & Plan:  Bronchitis/bronchospasm- symptoms are most consistent with bronchitis/bronchospasm (though no overt wheezing is noted today. Will rx with albuterol, zpak. Tussionex HS prn.  Pt advised to call if symptoms worsen or do not improve.

## 2015-05-28 NOTE — Progress Notes (Signed)
Pre visit review using our clinic review tool, if applicable. No additional management support is needed unless otherwise documented below in the visit note. 

## 2015-05-28 NOTE — Patient Instructions (Signed)
Start albuterol 2 sprays every 6 hours as needed for cough/wheezing. Start zpak (antibiotics) for bronchitis. You may use tussionex at bedtime as needed for cough. Call if symptoms worsen, if you develop fever, or if not improved in 1 week.

## 2015-06-18 ENCOUNTER — Encounter: Payer: Self-pay | Admitting: Family Medicine

## 2015-06-18 ENCOUNTER — Ambulatory Visit (INDEPENDENT_AMBULATORY_CARE_PROVIDER_SITE_OTHER): Payer: 59 | Admitting: Family Medicine

## 2015-06-18 VITALS — BP 144/92 | HR 93 | Temp 98.1°F | Resp 16 | Ht 65.0 in | Wt 187.5 lb

## 2015-06-18 DIAGNOSIS — I10 Essential (primary) hypertension: Secondary | ICD-10-CM | POA: Diagnosis not present

## 2015-06-18 MED ORDER — LOSARTAN POTASSIUM 100 MG PO TABS: 100.0000 mg | ORAL_TABLET | Freq: Every day | ORAL | Status: DC

## 2015-06-18 NOTE — Progress Notes (Signed)
   Subjective:    Patient ID: [name], female    DOB: April 13, 1976, 39 y.o.   MRN: [id number]  HPI HTN- chronic problem.  Pt had her BP checked at work and it was 160/110.  She is on Losartan 50mg  nightly bc she states this causes dizziness if she takes it in the AM.  Pt reports that she will check BP periodically and the lowest DBP is 'in the 90s'.  SBP readings are consistently in the 140s-150s.  No CP, SOB.  + HAs and visual changes- floaters.   Review of Systems For ROS see HPI     Objective:   Physical Exam  Constitutional: She is oriented to person, place, and time. She appears well-developed and well-nourished. No distress.  HENT:  Head: Normocephalic and atraumatic.  Eyes: Conjunctivae and EOM are normal. Pupils are equal, round, and reactive to light.  Neck: Normal range of motion. Neck supple. No thyromegaly present.  Cardiovascular: Normal rate, regular rhythm, normal heart sounds and intact distal pulses.   No murmur heard. Pulmonary/Chest: Effort normal and breath sounds normal. No respiratory distress.  Abdominal: Soft. She exhibits no distension. There is no tenderness.  Musculoskeletal: She exhibits no edema.  Lymphadenopathy:    She has no cervical adenopathy.  Neurological: She is alert and oriented to person, place, and time.  Skin: Skin is warm and dry.  Psychiatric: She has a normal mood and affect. Her behavior is normal.  Vitals reviewed.         Assessment & Plan:

## 2015-06-18 NOTE — Progress Notes (Signed)
Pre visit review using our clinic review tool, if applicable. No additional management support is needed unless otherwise documented below in the visit note. 

## 2015-06-18 NOTE — Assessment & Plan Note (Signed)
Deteriorated.  Pt's BP is elevated here today and when she spot checks at work and at home.  + HAs.  No other sxs.  Increase Losartan to 100mg  daily.  Discussed need for low Na diet.  Will follow closely for BP recheck and repeat BMP.  Pt expressed understanding and is in agreement w/ plan.

## 2015-06-18 NOTE — Patient Instructions (Signed)
Follow up in 3-4 weeks to recheck BP Increase the Losartan to 100mg  daily- 2 of what you have at home and 1 of the new prescription Drink plenty of water Continue to limit your salt intake- this will improve your blood pressure Call with any questions or concerns Hang in there!!  We'll get this right!

## 2015-07-09 ENCOUNTER — Ambulatory Visit (INDEPENDENT_AMBULATORY_CARE_PROVIDER_SITE_OTHER): Payer: 59 | Admitting: Family Medicine

## 2015-07-09 ENCOUNTER — Encounter: Payer: Self-pay | Admitting: Family Medicine

## 2015-07-09 VITALS — BP 128/86 | HR 73 | Temp 98.0°F | Resp 16 | Ht 65.0 in | Wt 186.4 lb

## 2015-07-09 DIAGNOSIS — I10 Essential (primary) hypertension: Secondary | ICD-10-CM

## 2015-07-09 NOTE — Assessment & Plan Note (Signed)
Chronic problem.  Improved since increasing Losartan.  Asymptomatic.  Check BMP.  No anticipated med changes.  Will follow.

## 2015-07-09 NOTE — Patient Instructions (Signed)
Schedule your complete physical for April We'll notify you of your lab results and make any changes if needed Keep up the good work!  You look great!! Call with any questions or concerns Happy Holidays!!

## 2015-07-09 NOTE — Progress Notes (Signed)
Pre visit review using our clinic review tool, if applicable. No additional management support is needed unless otherwise documented below in the visit note. 

## 2015-07-09 NOTE — Progress Notes (Signed)
   Subjective:    Patient ID: [name], female    DOB: 05/15/1976, 39 y.o.   MRN: [id number]  HPI HTN- chronic problem, BP is better today after increasing Losartan at last visit.  No CP, SOB, HAs other than sinus pressure, visual changes, edema.   Review of Systems For ROS see HPI     Objective:   Physical Exam  Constitutional: She is oriented to person, place, and time. She appears well-developed and well-nourished. No distress.  HENT:  Head: Normocephalic and atraumatic.  Eyes: Conjunctivae and EOM are normal. Pupils are equal, round, and reactive to light.  Neck: Normal range of motion. Neck supple. No thyromegaly present.  Cardiovascular: Normal rate, regular rhythm, normal heart sounds and intact distal pulses.   No murmur heard. Pulmonary/Chest: Effort normal and breath sounds normal. No respiratory distress.  Abdominal: Soft. She exhibits no distension. There is no tenderness.  Musculoskeletal: She exhibits no edema.  Lymphadenopathy:    She has no cervical adenopathy.  Neurological: She is alert and oriented to person, place, and time.  Skin: Skin is warm and dry.  Psychiatric: She has a normal mood and affect. Her behavior is normal.  Vitals reviewed.         Assessment & Plan:

## 2015-07-10 LAB — BASIC METABOLIC PANEL
BUN: 10 mg/dL (ref 6–23)
CHLORIDE: 106 meq/L (ref 96–112)
CO2: 26 meq/L (ref 19–32)
CREATININE: 0.9 mg/dL (ref 0.40–1.20)
Calcium: 9.8 mg/dL (ref 8.4–10.5)
GFR: 89.07 mL/min (ref >60.00)
Glucose, Bld: 81 mg/dL (ref 70–99)
POTASSIUM: 4.6 meq/L (ref 3.5–5.1)
Sodium: 140 mEq/L (ref 135–145)

## 2015-07-19 ENCOUNTER — Telehealth: Payer: Self-pay

## 2015-08-14 ENCOUNTER — Encounter: Payer: Self-pay | Admitting: Family Medicine

## 2015-08-14 ENCOUNTER — Telehealth: Payer: Self-pay | Admitting: *Deleted

## 2015-08-14 DIAGNOSIS — Z0184 Encounter for antibody response examination: Secondary | ICD-10-CM

## 2015-08-14 NOTE — Telephone Encounter (Signed)
Completed as much as possible and forwarded to Dr. Beverely Lowabori. JG//CMA

## 2015-08-16 NOTE — Telephone Encounter (Signed)
Form faxed to pt at [phone] successfully, copy sent for scanning. JG//CMA

## 2015-08-20 NOTE — Telephone Encounter (Signed)
Pt says that the form that was completed didn't provide her most recent eye exam, so her form was denied. although pt says that she isn't sure if she completed one during her visit.  Pt would like for PCP to complete those details if possible and fax form back to her at: [phone] and she will [facility] it [facility] the [facility].   CB: [phone]

## 2015-08-21 ENCOUNTER — Other Ambulatory Visit: Payer: Self-pay | Admitting: *Deleted

## 2015-08-21 NOTE — Telephone Encounter (Signed)
Called and scheduled pt for Thursday at 4p. Pt says that she also need a record of her MMR, Hep B and TB test.  °

## 2015-08-21 NOTE — Telephone Encounter (Signed)
No record of these shots in our record or in [facility]. Ok to place lab order and inform pt?

## 2015-08-21 NOTE — Telephone Encounter (Signed)
She did not. 

## 2015-08-21 NOTE — Patient Outreach (Addendum)
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  Subjective:  [name] was referred to the Link To Wellness program through the benefits fair at [facility] hospital. She says she was diagnosed with high BP during each of her three pregnancies. She says she was placed on medication about one year ago. She says she struggles at times to remember to take her blood pressure medicine.  Objective:   Review of Systems  Constitutional: Negative.     Physical Exam  Constitutional: She is oriented to person, place, and time. She appears well-developed and well-nourished.  Respiratory: Effort normal.  Neurological: She is alert and oriented to person, place, and time.  Skin: Skin is warm and dry.  Psychiatric: She has a normal mood and affect. Her behavior is normal. Judgment and thought content normal.   Filed Weights   08/21/15 1513  Weight: 186 lb 3.2 oz (84.46 kg)   Filed Vitals:   08/21/15 1513  BP: 128/94    Current Medications:   Current Outpatient Prescriptions  Medication Sig Dispense Refill  . losartan (COZAAR) 100 MG tablet Take 1 tablet (100 mg total) by mouth daily. 30 tablet 6  . meloxicam (MOBIC) 15 MG tablet Take 15 mg by mouth daily.    Marland Kitchen albuterol (PROVENTIL HFA;VENTOLIN HFA) 108 (90 BASE) MCG/ACT inhaler Inhale 2 puffs into the lungs every 6 (six) hours as needed for wheezing or shortness of breath. (Patient not taking: Reported on 08/21/2015) 1 Inhaler 0  . meclizine (ANTIVERT) 50 MG tablet Take 1 tablet (50 mg total) by mouth 3 (three) times daily as needed for dizziness. (Patient not taking: Reported on 07/09/2015) 45 tablet 0   No current facility-administered medications for this visit.    Functional Status:   In your present state of health, do  you have any difficulty performing the following activities: 08/21/2015 07/09/2015  Hearing? N N  Vision? N N  Difficulty concentrating or making decisions? N N  Walking or climbing stairs? N N  Dressing or bathing? N N  Doing errands, shopping? N N     Assessment:   [name] employee enrolling in the Link To Wellness program for self management assistance with HTN.  Plan:  [facility] CM Care Plan Problem One        Most Recent Value   Care Plan Problem One  Patient with hypertension- knowledge deficit related to self management strategies   Role Documenting the Problem One  Care Management Coordinator   Care Plan for Problem One  Active   [facility] Long Term Goal (31-90 days)  By March 20,2017 patient will voice: less than 2 missed doses of BP medicine per week, the correct recommended amount of daily sodium to consume, that she has increased the number of steps she is walking in her daily routine. Patient will also complete all assigned Emmi education modules by the due date, she will keep a food diary x 2 days and calculate the amount of sodium consumed in those two days to assist her with meeting the target of <1500 mg of sodium per day, she will research and voice correctly the basic content of the DASH (dietary approaches to stop hypertension) diet, and check her BP at least twice weekly with 75% of the readings meeting the treatment target of <140/<90.  [facility] Long Term Goal Start Date  08/21/15   Interventions for Problem One Long Term Goal  Discussed Link to Wellness program goals, requirements and benefits, reviewed member's rights and responsibilities ,provided hypertension  information packet with explanation of contents, ensured member agreed and signed consent to participate and authorization to release and receive health information, consent, participation agreement and consent to enroll in program, assessed member's current knowledge of  essential hypertension, discussed strategies to reduce  BP related to meal planning, exercise and medication adherence,  reviewed medications and discussed BP medicine of Losartan including dose, common side effects and mechanism of action, issued voucher to purchase home BP monitor for $5 at a [facility] pharmacy, discusssed BP self monitoring, discussed strategies to improve medication adherence,  assigned Emmi education modules related to HTN, reviewed nutritional counseling benefit offered at no cost by [facility] , discussed importance of consistent exercise and weight loss in managing  HTN, will contact patient via phone or secure- email in 90 days for Link To Wellness follow up      RNCM to fax today's office visit note to Dr. Beverely Lowabori. RNCM will contact patient in 90 days to assess patient's progress toward mutually set goals related to HTN self management.  Bary RichardJanet S. Ashlay Altieri RN,CCM,CDE [facility] Care Management Coordinator Link To Wellness Office Phone [phone] Office Fax [phone]

## 2015-08-21 NOTE — Telephone Encounter (Signed)
i do not have any record of these tests. Did the pt provide you with any paperwork on these immunizations?

## 2015-08-21 NOTE — Telephone Encounter (Signed)
Please call pt and schedule a nurse visit for an eye exam because I do not see in her chart where one was completed during physical. Thank you, JG//CMA

## 2015-08-22 ENCOUNTER — Encounter: Payer: Self-pay | Admitting: *Deleted

## 2015-08-22 NOTE — Addendum Note (Signed)
Addended by: Geannie RisenBRODMERKEL, JESSICA L on: 08/22/2015 08:28 AM   Modules accepted: Orders

## 2015-08-22 NOTE — Telephone Encounter (Signed)
Lab scheduled °

## 2015-08-22 NOTE — Telephone Encounter (Signed)
Per CMS note below, please call patient and schedule patient a lab appointment that coincides with her Nurse visit/SLS

## 2015-08-22 NOTE — Telephone Encounter (Signed)
Pt will need MMR titers, Heb B surface antibody, and quantiferon gold

## 2015-08-22 NOTE — Telephone Encounter (Signed)
I ordered these labs, can you please call and inform pt that these are needed and schedule a lab appt.

## 2015-08-23 ENCOUNTER — Other Ambulatory Visit (INDEPENDENT_AMBULATORY_CARE_PROVIDER_SITE_OTHER): Payer: 59

## 2015-08-23 ENCOUNTER — Ambulatory Visit: Payer: 59

## 2015-08-23 DIAGNOSIS — Z0184 Encounter for antibody response examination: Secondary | ICD-10-CM | POA: Diagnosis not present

## 2015-08-23 DIAGNOSIS — Z0000 Encounter for general adult medical examination without abnormal findings: Secondary | ICD-10-CM

## 2015-08-23 NOTE — Progress Notes (Signed)
Pre visit review using our clinic review tool, if applicable. No additional management support is needed unless otherwise documented below in the visit note.  Patient in for Eye exam to complete Physical Exam  and Lab drawn. Patient sent to lab after Vision check

## 2015-08-24 LAB — MEASLES/MUMPS/RUBELLA IMMUNITY
MUMPS IGG: 37 [AU]/ml — AB (ref <9.00)
RUBEOLA IGG: 60.8 [AU]/ml — AB (ref <25.00)
Rubella: 1.83 Index — ABNORMAL HIGH (ref <0.90)

## 2015-08-24 LAB — HEPATITIS B SURFACE ANTIBODY,QUALITATIVE: Hep B S Ab: NEGATIVE

## 2015-08-25 LAB — QUANTIFERON TB GOLD ASSAY (BLOOD)
INTERFERON GAMMA RELEASE ASSAY: NEGATIVE
Mitogen value: >10 IU/mL
Quantiferon Nil Value: 0.06 IU/mL
Quantiferon Tb Ag Minus Nil Value: -0.03 IU/mL
TB AG VALUE: 0.03 [IU]/mL

## 2015-08-28 ENCOUNTER — Telehealth: Payer: Self-pay | Admitting: Family Medicine

## 2015-08-28 NOTE — Telephone Encounter (Signed)
Correct, please schedule pt for a nurse visit.

## 2015-08-28 NOTE — Telephone Encounter (Signed)
Caller name: Self   Can be reached:  [phone] (M)   Reason for call: Patient requesting order for Hep B Series. States she was informed in My Chart that she needed it.

## 2015-08-28 NOTE — Telephone Encounter (Signed)
Scheduled appt.

## 2015-08-29 ENCOUNTER — Ambulatory Visit (INDEPENDENT_AMBULATORY_CARE_PROVIDER_SITE_OTHER): Payer: 59

## 2015-08-29 DIAGNOSIS — Z23 Encounter for immunization: Secondary | ICD-10-CM | POA: Diagnosis not present

## 2015-09-26 ENCOUNTER — Ambulatory Visit (INDEPENDENT_AMBULATORY_CARE_PROVIDER_SITE_OTHER): Payer: 59 | Admitting: *Deleted

## 2015-09-26 DIAGNOSIS — Z23 Encounter for immunization: Secondary | ICD-10-CM

## 2015-09-26 NOTE — Progress Notes (Signed)
Pre visit review using our clinic review tool, if applicable. No additional management support is needed unless otherwise documented below in the visit note.  Pt tolerated injection well. No S/S of a reaction upon leaving the clinic.   Next appt: 01/02/2016  Starla Link, RN

## 2015-11-28 MED FILL — FLUCONAZOLE 150 MG TABLET: 150 | 3 days supply | Qty: 2 | Fill #0

## 2015-12-19 ENCOUNTER — Telehealth: Payer: Self-pay | Admitting: Family Medicine

## 2015-12-19 ENCOUNTER — Encounter: Payer: 59 | Admitting: Family Medicine

## 2015-12-20 ENCOUNTER — Encounter: Payer: Self-pay | Admitting: Family Medicine

## 2015-12-20 NOTE — Telephone Encounter (Signed)
No charge- but next one will be

## 2015-12-20 NOTE — Telephone Encounter (Signed)
Waiving fee and mailing warning letter °

## 2015-12-20 NOTE — Telephone Encounter (Signed)
Pt was no show 12/19/15 3:30pm for cpe appt, pt has not rescheduled, 1st no show I see, charge or no charge?

## 2016-01-02 ENCOUNTER — Ambulatory Visit: Payer: 59

## 2016-01-07 ENCOUNTER — Telehealth: Payer: Self-pay | Admitting: Family Medicine

## 2016-01-07 NOTE — Telephone Encounter (Signed)
VM from pt 01/07/16 3:44pm to schedule appt in 1 month, left msg for pt on cell # to contact [place] office directly to schedule

## 2016-02-06 ENCOUNTER — Ambulatory Visit (INDEPENDENT_AMBULATORY_CARE_PROVIDER_SITE_OTHER): Payer: 59 | Admitting: General Practice

## 2016-02-06 DIAGNOSIS — Z23 Encounter for immunization: Secondary | ICD-10-CM | POA: Diagnosis not present

## 2016-02-22 ENCOUNTER — Ambulatory Visit (INDEPENDENT_AMBULATORY_CARE_PROVIDER_SITE_OTHER): Payer: 59 | Admitting: Family Medicine

## 2016-02-22 ENCOUNTER — Encounter: Payer: Self-pay | Admitting: Family Medicine

## 2016-02-22 VITALS — BP 130/84 | HR 17 | Temp 98.7°F | Resp 16 | Ht 65.0 in | Wt 186.0 lb

## 2016-02-22 DIAGNOSIS — R1314 Dysphagia, pharyngoesophageal phase: Secondary | ICD-10-CM | POA: Insufficient documentation

## 2016-02-22 DIAGNOSIS — R0789 Other chest pain: Secondary | ICD-10-CM | POA: Diagnosis not present

## 2016-02-22 MED ORDER — GI COCKTAIL ~~LOC~~
30.0000 mL | Freq: Once | ORAL | Status: AC
  Administered 2016-02-22: 30 mL via ORAL

## 2016-02-22 MED ORDER — PANTOPRAZOLE SODIUM 40 MG PO TBEC: 40.0000 mg | DELAYED_RELEASE_TABLET | Freq: Every day | ORAL | Status: DC

## 2016-02-22 MED FILL — MELOXICAM 15 MG TABLET: 15 | 30 days supply | Qty: 30 | Fill #2

## 2016-02-22 MED FILL — PANTOPRAZOLE SOD DR 40 MG T: 40 | 30 days supply | Qty: 30 | Fill #0

## 2016-02-22 MED FILL — LOSARTAN POTASSIUM 100 MG T: 100 | 30 days supply | Qty: 30 | Fill #2

## 2016-02-22 NOTE — Progress Notes (Signed)
   Subjective:    Patient ID: [name], female    DOB: 15-Jan-1976, 40 y.o.   MRN: [id number]  HPI Trouble swallowing- sxs started a few weeks ago when eating.  Pt feels like she has 'a lump' on the R side- would drink water trying to get the sensation to go away, would forcefully cough- to the point of vomiting.  After the lump, she develops a burning pain that radiates up into jaw.  This AM developed L chest pain, burning in the chest, increased belching, L arm pain described as an ache.  No SOB.  + nausea.  Denies obvious issues w/ GERD.  Lump in throat is occuring each time she eats.  No difficulty w/ water but similar sxs w/ soda.   Review of Systems For ROS see HPI     Objective:   Physical Exam  Constitutional: She is oriented to person, place, and time. She appears well-developed and well-nourished. No distress.  HENT:  Head: Normocephalic and atraumatic.  Nose: Nose normal.  Mouth/Throat: Oropharynx is clear and moist. No oropharyngeal exudate.  Eyes: Conjunctivae and EOM are normal. Pupils are equal, round, and reactive to light.  Neck: Normal range of motion. Neck supple. No thyromegaly present.  Cardiovascular: Normal rate, regular rhythm, normal heart sounds and intact distal pulses.   No murmur heard. Pulmonary/Chest: Effort normal and breath sounds normal. No respiratory distress.  Abdominal: Soft. She exhibits no distension. There is no tenderness.  Musculoskeletal: She exhibits no edema.  Lymphadenopathy:    She has no cervical adenopathy.  Neurological: She is alert and oriented to person, place, and time.  Skin: Skin is warm and dry.  Psychiatric: She has a normal mood and affect. Her behavior is normal.  Vitals reviewed.         Assessment & Plan:

## 2016-02-22 NOTE — Progress Notes (Signed)
Pre visit review using our clinic review tool, if applicable. No additional management support is needed unless otherwise documented below in the visit note. 

## 2016-02-22 NOTE — Patient Instructions (Signed)
Follow up in 1 week by phone or MyChart to let me know how you're doing Start the Protonix once daily to improve possible reflux Avoid eating prior to bed as this will worsen the symptoms Drink plenty of fluids Call with any questions or concerns Hang in there!  We'll figure this out!!!

## 2016-02-24 NOTE — Assessment & Plan Note (Signed)
New.  Start PPI and if no improvement will need GI referral.  Pt expressed understanding and is in agreement w/ plan.

## 2016-02-24 NOTE — Assessment & Plan Note (Signed)
New.  I suspect that this is due to her probable reflux.  EKG WNL.  Pt currently asymptomatic in office.  Start PPI.  Reviewed supportive care and red flags that should prompt return.  Pt expressed understanding and is in agreement w/ plan.

## 2016-03-20 ENCOUNTER — Encounter: Payer: Self-pay | Admitting: Family Medicine

## 2016-03-20 ENCOUNTER — Ambulatory Visit (INDEPENDENT_AMBULATORY_CARE_PROVIDER_SITE_OTHER): Payer: 59 | Admitting: Family Medicine

## 2016-03-20 VITALS — BP 131/81 | HR 69 | Temp 98.3°F | Resp 16 | Ht 65.0 in | Wt 188.2 lb

## 2016-03-20 DIAGNOSIS — M25511 Pain in right shoulder: Secondary | ICD-10-CM

## 2016-03-20 MED ORDER — PREDNISONE 10 MG PO TABS: ORAL_TABLET | ORAL | Status: DC

## 2016-03-20 MED FILL — predniSONE 10 MG TABS: 10 | 9 days supply | Qty: 18 | Fill #0

## 2016-03-20 NOTE — Patient Instructions (Signed)
Follow up as needed Start the Prednisone as directed- take w/ food STOP the Mobic while taking the Prednisone (restart when Prednisone is complete) We'll call you with your Ortho appt Alternate heat and ice for pain relief Call with any questions or concerns Hang in there!!!

## 2016-03-20 NOTE — Progress Notes (Signed)
   Subjective:    Patient ID: [name], female    DOB: [birth year], 40 y.o.   MRN: [id number]  HPI R shoulder pain- 'constantly aching' and will develop 'sharp pains w/ movement'.  sxs started ~3 weeks ago.  No known injury.  Pain is waking her from sleep.  Pain improves w/ heat but no improvement w/ Mobic.  Very limited in overhead motion.  Will even have pain pulling on pants.   Review of Systems For ROS see HPI     Objective:   Physical Exam  Constitutional: She is oriented to person, place, and time. She appears well-developed and well-nourished. No distress.  HENT:  Head: Normocephalic and atraumatic.  Cardiovascular: Intact distal pulses.   Musculoskeletal: She exhibits tenderness (TTP over R posterior shoulder). She exhibits no edema.  + impingement signs on R Pain w/ abduction and forward flexion at 80 degrees Pain w/ internal rotation  Neurological: She is alert and oriented to person, place, and time. She has normal reflexes. No cranial nerve deficit. Coordination normal.  Skin: Skin is warm and dry.  Vitals reviewed.         Assessment & Plan:  R shoulder pain- pt w/ severely limited ROM due to pain.  No improvement w/ Mobic.  No known injury.  Concern for bone spur or other impingement causing rotator cuff tendonitis.  Start pred taper.  Refer to ortho.  Reviewed supportive care and red flags that should prompt return.  Pt expressed understanding and is in agreement w/ plan.

## 2016-04-27 ENCOUNTER — Encounter (HOSPITAL_COMMUNITY): Payer: Self-pay | Admitting: Emergency Medicine

## 2016-04-27 ENCOUNTER — Emergency Department (HOSPITAL_COMMUNITY): Payer: 59

## 2016-04-27 ENCOUNTER — Emergency Department (HOSPITAL_COMMUNITY)
Admission: EM | Admit: 2016-04-27 | Discharge: 2016-04-28 | Disposition: A | Discharge status: Home / Self Care | Payer: 59 | Attending: Emergency Medicine | Admitting: Emergency Medicine

## 2016-04-27 DIAGNOSIS — Z791 Long term (current) use of non-steroidal anti-inflammatories (NSAID): Secondary | ICD-10-CM | POA: Diagnosis not present

## 2016-04-27 DIAGNOSIS — I10 Essential (primary) hypertension: Secondary | ICD-10-CM | POA: Insufficient documentation

## 2016-04-27 DIAGNOSIS — G43909 Migraine, unspecified, not intractable, without status migrainosus: Secondary | ICD-10-CM | POA: Diagnosis present

## 2016-04-27 DIAGNOSIS — R51 Headache: Secondary | ICD-10-CM | POA: Diagnosis not present

## 2016-04-27 DIAGNOSIS — Z79899 Other long term (current) drug therapy: Secondary | ICD-10-CM | POA: Diagnosis not present

## 2016-04-27 LAB — CBC WITH DIFFERENTIAL/PLATELET
BASOS ABS: 0.1 10*3/uL (ref 0.0–0.1)
Basophils Relative: 2 %
Eosinophils Absolute: 0.4 10*3/uL (ref 0.0–0.7)
Eosinophils Relative: 8 %
HEMATOCRIT: 36.1 % (ref 36.0–46.0)
Hemoglobin: 13.1 g/dL (ref 12.0–15.0)
LYMPHS PCT: 35 %
Lymphs Abs: 1.5 10*3/uL (ref 0.7–4.0)
MCH: 30.8 pg (ref 26.0–34.0)
MCHC: 36.3 g/dL — ABNORMAL HIGH (ref 30.0–36.0)
MCV: 84.7 fL (ref 78.0–100.0)
MONO ABS: 0.4 10*3/uL (ref 0.1–1.0)
MONOS PCT: 9 %
NEUTROS ABS: 2 10*3/uL (ref 1.7–7.7)
Neutrophils Relative %: 46 %
Platelets: 343 10*3/uL (ref 150–400)
RBC: 4.26 MIL/uL (ref 3.87–5.11)
RDW: 12.4 % (ref 11.5–15.5)
WBC: 4.4 10*3/uL (ref 4.0–10.5)

## 2016-04-27 LAB — BASIC METABOLIC PANEL
ANION GAP: 3 — AB (ref 5–15)
BUN: 10 mg/dL (ref 6–20)
CALCIUM: 8.7 mg/dL — AB (ref 8.9–10.3)
CO2: 26 mmol/L (ref 22–32)
Chloride: 107 mmol/L (ref 101–111)
Creatinine, Ser: 1.17 mg/dL — ABNORMAL HIGH (ref 0.44–1.00)
GFR calc Af Amer: >60 mL/min (ref >60)
GFR calc non Af Amer: 57 mL/min — ABNORMAL LOW (ref >60)
GLUCOSE: 96 mg/dL (ref 65–99)
Potassium: 3.5 mmol/L (ref 3.5–5.1)
Sodium: 136 mmol/L (ref 135–145)

## 2016-04-27 MED ORDER — METOCLOPRAMIDE HCL 5 MG/ML IJ SOLN
10.0000 mg | Freq: Once | INTRAMUSCULAR | Status: AC
  Administered 2016-04-27: 10 mg via INTRAVENOUS
  Filled 2016-04-27: qty 2

## 2016-04-27 MED ORDER — SODIUM CHLORIDE 0.9 % IV SOLN
INTRAVENOUS | Status: DC
  Administered 2016-04-27: 10 mL/h via INTRAVENOUS

## 2016-04-27 MED ORDER — DIPHENHYDRAMINE HCL 50 MG/ML IJ SOLN
25.0000 mg | Freq: Once | INTRAMUSCULAR | Status: AC
  Administered 2016-04-27: 25 mg via INTRAVENOUS
  Filled 2016-04-27: qty 1

## 2016-04-27 NOTE — ED Provider Notes (Signed)
[facility]-EMERGENCY DEPT Provider Note   CSN: [id number] Arrival date & time: 04/27/16  2142     History   Chief Complaint Chief Complaint  Patient presents with  . Hypertension  . Migraine    HPI [name] is a 40 y.o. female.  40 year old female presents with increased blood pressure at home. Has a history of hypertension but has been noncompliant with her medications. Did take a dose of her losartan prior to arrival here. Has had some photophobia but no phonophobia. Denies any neck pain. She's had nausea but no vomiting. Had some flashes of light in her right eye but denies any vision loss. No focal weakness. No ataxia or confusion. No other medications used for her headache.      Past Medical History:  Diagnosis Date  . Arthritis    right foot  . Congenital flat foot    has had bilateral foot surgery to correct  . Hypertension   . Irritable bowel syndrome (IBS)    no current med.  . Painful orthopaedic hardware 03/2013   right foot    Patient Active Problem List   Diagnosis Date Noted  . Atypical chest pain 02/22/2016  . Dysphagia, pharyngoesophageal phase 02/22/2016  . Physical exam 12/15/2014  . Left hip pain 09/29/2014  . Radicular low back pain 09/11/2014  . Bilateral hip bursitis 09/11/2014  . BPV (benign positional vertigo) 02/22/2014  . HTN (hypertension) 02/22/2014    Past Surgical History:  Procedure Laterality Date  . CALCANEAL OSTEOTOMY Right 12/23/2012   Procedure: RIGHT CALCANEAL OSTEOTOMY, RIGHT LATERAL COLUMN LENGTHENING, RIGHT COTTON OSTEOTOMY, RIGHT FLEX DIGITORIUM LONGUS TRANSFER, RIGHT KIDNER PROCEDURE ;  Surgeon: Toni Arthurs, MD;  Location: [facility];  Service: Orthopedics;  Laterality: Right;  . CESAREAN SECTION  07/13/2005; 06/03/2006  . DILATION AND EVACUATION  08/16/2008  . FOOT SURGERY Left 2002  . GASTROCNEMIUS RECESSION Right 12/23/2012   Procedure: RIGHT GASTRO RECESSION ;  Surgeon: Toni Arthurs, MD;  Location: [facility];  Service: Orthopedics;  Laterality: Right;  . HARDWARE REMOVAL Right 03/24/2013   Procedure: HARDWARE REMOVAL RIGHT HEEL;  Surgeon: Toni Arthurs, MD;  Location: [facility];  Service: Orthopedics;  Laterality: Right;  . WISDOM TOOTH EXTRACTION      OB History    No data available       Home Medications    Prior to Admission medications   Medication Sig Start Date End Date Taking? Authorizing Provider  losartan (COZAAR) 100 MG tablet Take 1 tablet (100 mg total) by mouth daily. 06/18/15   Sheliah Hatch, MD  meloxicam (MOBIC) 15 MG tablet Take 15 mg by mouth daily. Reported on 02/22/2016    Historical Provider, MD  pantoprazole (PROTONIX) 40 MG tablet Take 1 tablet (40 mg total) by mouth daily. 02/22/16   Sheliah Hatch, MD  predniSONE (DELTASONE) 10 MG tablet 3 tabs x3 days and then 2 tabs x3 days and then 1 tab x3 days.  Take w/ food. 03/20/16   Sheliah Hatch, MD    Family History Family History  Problem Relation Age of Onset  . Hypertension Mother   . Hypertension Father   . Diabetes Father     Social History Social History  Substance Use Topics  . Smoking status: Never Smoker  . Smokeless tobacco: Never Used  . Alcohol use No     Allergies   Sulfa antibiotics   Review of Systems Review of Systems  All other systems  reviewed and are negative.    Physical Exam Updated Vital Signs BP (!) 185/104 (BP Location: Left Arm)   Pulse 86   Temp 98.7 F (37.1 C) (Oral)   Resp 16   SpO2 100%   Physical Exam  Constitutional: She is oriented to person, place, and time. She appears well-developed and well-nourished.  Non-toxic appearance. No distress.  HENT:  Head: Normocephalic and atraumatic.  Eyes: Conjunctivae, EOM and lids are normal. Pupils are equal, round, and reactive to light.  Neck: Normal range of motion. Neck supple. No tracheal deviation present. No thyroid mass present.  Cardiovascular: Normal rate, regular  rhythm and normal heart sounds.  Exam reveals no gallop.   No murmur heard. Pulmonary/Chest: Effort normal and breath sounds normal. No stridor. No respiratory distress. She has no decreased breath sounds. She has no wheezes. She has no rhonchi. She has no rales.  Abdominal: Soft. Normal appearance and bowel sounds are normal. She exhibits no distension. There is no tenderness. There is no rebound and no CVA tenderness.  Musculoskeletal: Normal range of motion. She exhibits no edema or tenderness.  Neurological: She is alert and oriented to person, place, and time. She has normal strength. No cranial nerve deficit or sensory deficit. GCS eye subscore is 4. GCS verbal subscore is 5. GCS motor subscore is 6.  Skin: Skin is warm and dry. No abrasion and no rash noted.  Psychiatric: She has a normal mood and affect. Her speech is normal and behavior is normal.  Nursing note and vitals reviewed.    ED Treatments / Results  Labs (all labs ordered are listed, but only abnormal results are displayed) Labs Reviewed  CBC WITH DIFFERENTIAL/PLATELET  BASIC METABOLIC PANEL    EKG  EKG Interpretation None       Radiology No results found.  Procedures Procedures (including critical care time)  Medications Ordered in ED Medications  0.9 %  sodium chloride infusion (not administered)  metoCLOPramide (REGLAN) injection 10 mg (not administered)  diphenhydrAMINE (BENADRYL) injection 25 mg (not administered)     Initial Impression / Assessment and Plan / ED Course  I have reviewed the triage vital signs and the nursing notes.  Pertinent labs & imaging results that were available during my care of the patient were reviewed by me and considered in my medical decision making (see chart for details).  Clinical Course      Final Clinical Impressions(s) / ED Diagnoses   Final diagnoses:  None    New Prescriptions New Prescriptions   No medications on file  Patient medicated here for  headache and feels better. Head CT without acute findings. Blood pressure is also improved. Patient follow-up with her Dr.   Lorre NickAnthony Bion Todorov, MD 04/28/16 0030

## 2016-04-27 NOTE — ED Notes (Signed)
Resting in bed with eyes closed call light in reach no reaction to medication noted visitors at bedside.

## 2016-04-27 NOTE — ED Triage Notes (Signed)
Pt from home with complaints of hypertension and headache on her right side with pressure behind her eye. Pt states that this began today. Pt states that she does not regularly take her bloodpressure medicine. Pt states that she just forgets about. Pt states that she did, however take her bp meds today. Pt states she is seeing dark spots in her right eye. Pt has an unremarkable neuro exam and reactive pupils. Manual bp is 160/98 sitting in left arm

## 2016-04-29 ENCOUNTER — Ambulatory Visit (INDEPENDENT_AMBULATORY_CARE_PROVIDER_SITE_OTHER): Payer: 59 | Admitting: Family Medicine

## 2016-04-29 ENCOUNTER — Encounter: Payer: Self-pay | Admitting: Family Medicine

## 2016-04-29 VITALS — BP 166/99 | HR 83 | Temp 99.0°F | Resp 16 | Ht 65.0 in | Wt 180.1 lb

## 2016-04-29 DIAGNOSIS — F411 Generalized anxiety disorder: Secondary | ICD-10-CM | POA: Diagnosis not present

## 2016-04-29 DIAGNOSIS — I10 Essential (primary) hypertension: Secondary | ICD-10-CM

## 2016-04-29 MED ORDER — VALSARTAN-HYDROCHLOROTHIAZIDE 160-12.5 MG PO TABS: 1.0000 | ORAL_TABLET | Freq: Every day | ORAL | 3 refills | Status: DC

## 2016-04-29 MED ORDER — CITALOPRAM HYDROBROMIDE 20 MG PO TABS: 20.0000 mg | ORAL_TABLET | Freq: Every day | ORAL | 3 refills | Status: DC

## 2016-04-29 MED FILL — CITALOPRAM HBR 20 MG TABLET: 20 | 30 days supply | Qty: 30 | Fill #0

## 2016-04-29 MED FILL — VALSARTAN-HCTZ 160-12.5 MG: 160-12.5 | 30 days supply | Qty: 30 | Fill #0

## 2016-04-29 NOTE — Progress Notes (Signed)
Pre visit review using our clinic review tool, if applicable. No additional management support is needed unless otherwise documented below in the visit note. 

## 2016-04-29 NOTE — Patient Instructions (Addendum)
Follow up in 3-4 weeks to recheck mood and BP STOP the Losartan START the Valsartan HCTZ daily for blood pressure Starting Saturday, start the Celexa for the anxiety Make sure you have a stress outlet!!! Call with any questions or concerns Hang in there!!!

## 2016-04-29 NOTE — Progress Notes (Signed)
   Subjective:    Patient ID: [name], female    DOB: July 01, 1976, 40 y.o.   MRN: [id number]  HPI HTN- chronic problem, pt was seen in ER on 8/27 for very high BP.  Pt reports high levels of stress.  Working full time, currently in school, husband lost his job.  Teenage son has new baby and he, girlfriend and new baby are now living w/ her.  Taking Losartan daily since ER visit.  Pt is having severe headaches.  No CP.  Some SOB but pt feels this may be anxiety.  Some blurry vision over the weekend- normal head CT.  + LE edema.   Review of Systems For ROS see HPI     Objective:   Physical Exam  Constitutional: She is oriented to person, place, and time. She appears well-developed and well-nourished. No distress.  HENT:  Head: Normocephalic and atraumatic.  Eyes: Conjunctivae and EOM are normal. Pupils are equal, round, and reactive to light.  Neck: Normal range of motion. Neck supple. No thyromegaly present.  Cardiovascular: Normal rate, regular rhythm, normal heart sounds and intact distal pulses.   No murmur heard. Pulmonary/Chest: Effort normal and breath sounds normal. No respiratory distress.  Abdominal: Soft. She exhibits no distension. There is no tenderness.  Musculoskeletal: She exhibits no edema.  Lymphadenopathy:    She has no cervical adenopathy.  Neurological: She is alert and oriented to person, place, and time.  Skin: Skin is warm and dry.  Psychiatric: She has a normal mood and affect. Her behavior is normal.  Vitals reviewed.         Assessment & Plan:

## 2016-04-29 NOTE — Assessment & Plan Note (Signed)
Deteriorated.  Pt's BP is quite elevated today.  + HAs, edema.  Suspect that her elevated BP is due to the large amount stress she currently has.  Will switch from Losartan to Valsartan HCTZ daily and monitor for improvement.  Pt expressed understanding and is in agreement w/ plan.

## 2016-04-29 NOTE — Assessment & Plan Note (Signed)
New.  Pt has unbelievable amount of stress at this time.  Start Celexa short term to help her deal w/ all that's going on.  Reviewed supportive care and red flags that should prompt return.  Pt expressed understanding and is in agreement w/ plan.

## 2016-05-23 ENCOUNTER — Ambulatory Visit: Payer: 59 | Admitting: Family Medicine

## 2016-05-23 ENCOUNTER — Ambulatory Visit: Payer: Self-pay | Admitting: Family Medicine

## 2016-05-23 DIAGNOSIS — Z0289 Encounter for other administrative examinations: Secondary | ICD-10-CM

## 2016-05-26 ENCOUNTER — Encounter: Payer: Self-pay | Admitting: Family Medicine

## 2016-06-24 DIAGNOSIS — N760 Acute vaginitis: Secondary | ICD-10-CM | POA: Diagnosis not present

## 2016-06-24 DIAGNOSIS — N924 Excessive bleeding in the premenopausal period: Secondary | ICD-10-CM | POA: Diagnosis not present

## 2016-06-24 DIAGNOSIS — R233 Spontaneous ecchymoses: Secondary | ICD-10-CM | POA: Diagnosis not present

## 2016-06-24 DIAGNOSIS — N930 Postcoital and contact bleeding: Secondary | ICD-10-CM | POA: Diagnosis not present

## 2016-06-24 DIAGNOSIS — Z3200 Encounter for pregnancy test, result unknown: Secondary | ICD-10-CM | POA: Diagnosis not present

## 2016-06-25 MED FILL — metroNIDAZOLE 0.75 % GEL: 0.75 | 5 days supply | Qty: 70 | Fill #0

## 2016-07-09 DIAGNOSIS — H5213 Myopia, bilateral: Secondary | ICD-10-CM | POA: Diagnosis not present

## 2016-07-09 DIAGNOSIS — H524 Presbyopia: Secondary | ICD-10-CM | POA: Diagnosis not present

## 2016-07-09 DIAGNOSIS — H52223 Regular astigmatism, bilateral: Secondary | ICD-10-CM | POA: Diagnosis not present

## 2016-07-17 MED FILL — VALSARTAN-HCTZ 160-12.5 MG: 160-12.5 | 30 days supply | Qty: 30 | Fill #1

## 2016-07-23 ENCOUNTER — Encounter: Payer: Self-pay | Admitting: Family Medicine

## 2016-07-31 ENCOUNTER — Encounter: Payer: Self-pay | Admitting: Physician Assistant

## 2016-07-31 ENCOUNTER — Ambulatory Visit (INDEPENDENT_AMBULATORY_CARE_PROVIDER_SITE_OTHER): Payer: 59 | Admitting: Physician Assistant

## 2016-07-31 VITALS — BP 118/82 | HR 70 | Temp 98.1°F | Resp 16 | Ht 65.0 in | Wt 177.4 lb

## 2016-07-31 DIAGNOSIS — R238 Other skin changes: Secondary | ICD-10-CM | POA: Diagnosis not present

## 2016-07-31 DIAGNOSIS — R233 Spontaneous ecchymoses: Secondary | ICD-10-CM

## 2016-07-31 LAB — CBC
HCT: 41.3 % (ref 36.0–46.0)
Hemoglobin: 13.7 g/dL (ref 12.0–15.0)
MCHC: 33.3 g/dL (ref 30.0–36.0)
MCV: 87.7 fl (ref 78.0–100.0)
PLATELETS: 337 10*3/uL (ref 150.0–400.0)
RBC: 4.71 Mil/uL (ref 3.87–5.11)
RDW: 13.1 % (ref 11.5–15.5)
WBC: 3.9 10*3/uL — AB (ref 4.0–10.5)

## 2016-07-31 LAB — HEPATIC FUNCTION PANEL
ALK PHOS: 57 U/L (ref 39–117)
ALT: 14 U/L (ref 0–35)
AST: 13 U/L (ref 0–37)
Albumin: 4.5 g/dL (ref 3.5–5.2)
BILIRUBIN DIRECT: 0.1 mg/dL (ref 0.0–0.3)
Total Bilirubin: 0.5 mg/dL (ref 0.2–1.2)
Total Protein: 7.6 g/dL (ref 6.0–8.3)

## 2016-07-31 LAB — PROTIME-INR
INR: 1.1 ratio — AB (ref 0.8–1.0)
PROTHROMBIN TIME: 11.8 s (ref 9.6–13.1)

## 2016-07-31 NOTE — Patient Instructions (Signed)
Please continue current medication regimen.  Go to the lab for blood work. We will call you with your results and proceed with further assessment based on these results.  If you note any easy bleeding or severe, widespread bruising, please go to the ER.

## 2016-07-31 NOTE — Progress Notes (Signed)
Pre visit review using our clinic review tool, if applicable. No additional management support is needed unless otherwise documented below in the visit note. 

## 2016-07-31 NOTE — Progress Notes (Signed)
Patient presents to clinic today c/o easy bruising, most pronounced over the extremities over the past month. Denies any trauma or injury to account for bruising. Denies any note of easy bleeding. Denies gingival bleeding with dental hygiene. Denies lightheadednss, chest pain or SOB. Denies history of anemia or vitamin deficiency. Endorses good hydration and well-balanced diet. Denies personal history of easy bruising/bleeding or coagulation disorder. Denies family history of bleeding disorder. Denies recent changes to medication other than taking OTC probiotic.   Past Medical History:  Diagnosis Date  . Arthritis    right foot  . Congenital flat foot    has had bilateral foot surgery to correct  . Hypertension   . Irritable bowel syndrome (IBS)    no current med.  . Painful orthopaedic hardware [facility]) 03/2013   right foot    Current Outpatient Prescriptions on File Prior to Visit  Medication Sig Dispense Refill  . pantoprazole (PROTONIX) 40 MG tablet Take 40 mg by mouth daily as needed (for acid reflux).    . valsartan-hydrochlorothiazide (DIOVAN-HCT) 160-12.5 MG tablet Take 1 tablet by mouth daily. 30 tablet 3  . citalopram (CELEXA) 20 MG tablet Take 1 tablet (20 mg total) by mouth daily. (Patient not taking: Reported on 07/31/2016) 30 tablet 3   No current facility-administered medications on file prior to visit.     Allergies  Allergen Reactions  . Sulfa Antibiotics Other (See Comments)    Reaction:  Muscle pain     Family History  Problem Relation Age of Onset  . Hypertension Mother   . Hypertension Father   . Diabetes Father     Social History   Social History  . Marital status: Married    Spouse name: N/A  . Number of children: N/A  . Years of education: N/A   Social History Main Topics  . Smoking status: Never Smoker  . Smokeless tobacco: Never Used  . Alcohol use No  . Drug use: No  . Sexual activity: Yes    Birth control/ protection: IUD   Other Topics  Concern  . None   Social History Narrative  . None   Review of Systems - See HPI.  All other ROS are negative.  BP 118/82   Pulse 70   Temp 98.1 F (36.7 C) (Oral)   Resp 16   Ht 5\' 5"  (1.651 m)   Wt 177 lb 6 oz (80.5 kg)   SpO2 97%   BMI 29.52 kg/m   Physical Exam  Constitutional: She is oriented to person, place, and time and well-developed, well-nourished, and in no distress.  HENT:  Head: Normocephalic and atraumatic.  Mouth/Throat: Oropharynx is clear and moist.  Eyes: Pupils are equal, round, and reactive to light.  Neck: Neck supple.  Cardiovascular: Normal rate, regular rhythm, normal heart sounds and intact distal pulses.   Pulmonary/Chest: Effort normal and breath sounds normal. No respiratory distress. She has no wheezes. She has no rales. She exhibits no tenderness.  Neurological: She is alert and oriented to person, place, and time.  Skin: Skin is warm and dry. No petechiae and no rash noted.     Psychiatric: Affect normal.  Vitals reviewed.  Assessment/Plan: 1. Easy bruising No petechia noted. No evidence of gingival bleeding or petechia. Ecchymosis present are very small and with routine breakdown/resolution. Will check lab panel today to further assess. Will proceed with further workup/management based on findings.   - CBC - INR/PT - Hepatic function panel   Marcelline MatesMartin, Daiwik Buffalo  Einar Pheasant, PA-C

## 2016-08-04 ENCOUNTER — Ambulatory Visit: Payer: 59 | Admitting: Family Medicine

## 2016-09-02 MED FILL — VALSARTAN-HCTZ 160-12.5 MG: 160-12.5 | 30 days supply | Qty: 30 | Fill #2

## 2016-10-02 DIAGNOSIS — Z6831 Body mass index (BMI) 31.0-31.9, adult: Secondary | ICD-10-CM | POA: Diagnosis not present

## 2016-10-02 DIAGNOSIS — Z01419 Encounter for gynecological examination (general) (routine) without abnormal findings: Secondary | ICD-10-CM | POA: Diagnosis not present

## 2016-10-02 DIAGNOSIS — Z1231 Encounter for screening mammogram for malignant neoplasm of breast: Secondary | ICD-10-CM | POA: Diagnosis not present

## 2016-10-20 MED FILL — TRIAMCINOLONE 0.1% CREAM: 0.1 | 15 days supply | Qty: 30 | Fill #0

## 2016-10-21 MED FILL — VALSARTAN-HCTZ 160-12.5 MG: 160-12.5 | 30 days supply | Qty: 30 | Fill #3

## 2016-11-11 ENCOUNTER — Encounter: Payer: Self-pay | Admitting: Family Medicine

## 2016-11-11 NOTE — Telephone Encounter (Signed)
LM requesting call back to discuss symptoms.  

## 2016-11-12 NOTE — Telephone Encounter (Signed)
[facility]  I advised patient [name] is currently unavailable.  Pt requesting call back at [phone].

## 2016-11-12 NOTE — Telephone Encounter (Signed)
Spoke with patient regarding symptoms. Patient reports occasional chest discomfort that radiates to her back, with shortness of breath x 4 days. Patient relates to reflux but has not experienced these symptoms in the past and is concerned. Patient advised to go to Emergency Department/Urgent Care for evaluation. Patient verbalizes understanding and agrees with plan.

## 2016-11-12 NOTE — Telephone Encounter (Signed)
Returned call to patient, LM requesting Cb.

## 2016-12-08 ENCOUNTER — Other Ambulatory Visit: Payer: Self-pay | Admitting: Family Medicine

## 2016-12-08 MED FILL — VALSARTAN-HCTZ 160-12.5 MG: 160-12.5 | 30 days supply | Qty: 30 | Fill #0

## 2016-12-17 MED FILL — TERCONAZOLE 0.8% VAGINAL CR: 0.8 | 7 days supply | Qty: 20 | Fill #0

## 2017-02-03 DIAGNOSIS — N390 Urinary tract infection, site not specified: Secondary | ICD-10-CM | POA: Diagnosis not present

## 2017-02-03 DIAGNOSIS — R8299 Other abnormal findings in urine: Secondary | ICD-10-CM | POA: Diagnosis not present

## 2017-02-03 MED FILL — NITROFURANTOIN MONO-MCR 100: 100 | 7 days supply | Qty: 14 | Fill #0

## 2017-02-10 ENCOUNTER — Ambulatory Visit (HOSPITAL_COMMUNITY)
Admission: EM | Admit: 2017-02-10 | Discharge: 2017-02-10 | Disposition: A | Discharge status: Home / Self Care | Payer: 59 | Attending: Internal Medicine | Admitting: Internal Medicine

## 2017-02-10 ENCOUNTER — Encounter (HOSPITAL_COMMUNITY): Payer: Self-pay | Admitting: *Deleted

## 2017-02-10 DIAGNOSIS — M5489 Other dorsalgia: Secondary | ICD-10-CM | POA: Diagnosis not present

## 2017-02-10 DIAGNOSIS — M791 Myalgia: Secondary | ICD-10-CM

## 2017-02-10 DIAGNOSIS — M7918 Myalgia, other site: Secondary | ICD-10-CM

## 2017-02-10 MED ORDER — METHOCARBAMOL 500 MG PO TABS: 500.0000 mg | ORAL_TABLET | Freq: Two times a day (BID) | ORAL | 0 refills | Status: DC

## 2017-02-10 MED ORDER — DICLOFENAC SODIUM 75 MG PO TBEC: 75.0000 mg | DELAYED_RELEASE_TABLET | Freq: Two times a day (BID) | ORAL | 0 refills | Status: DC

## 2017-02-10 MED FILL — DICLOFENAC SOD 75 MG TAB EC: 75 | 10 days supply | Qty: 20 | Fill #0

## 2017-02-10 MED FILL — METHOCARBAMOL 500 MG TABLET: 500 | 10 days supply | Qty: 20 | Fill #0
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
## 2017-02-15 ENCOUNTER — Encounter: Payer: Self-pay | Admitting: Family Medicine

## 2017-02-16 ENCOUNTER — Ambulatory Visit (INDEPENDENT_AMBULATORY_CARE_PROVIDER_SITE_OTHER): Payer: 59 | Admitting: Family Medicine

## 2017-02-16 ENCOUNTER — Encounter: Payer: Self-pay | Admitting: Family Medicine

## 2017-02-16 VITALS — BP 120/80 | HR 64 | Temp 98.0°F | Resp 16 | Ht 65.0 in | Wt 176.5 lb

## 2017-02-16 DIAGNOSIS — L259 Unspecified contact dermatitis, unspecified cause: Secondary | ICD-10-CM

## 2017-02-16 MED ORDER — TRIAMCINOLONE ACETONIDE 0.1 % EX OINT: 1.0000 "application " | TOPICAL_OINTMENT | Freq: Two times a day (BID) | CUTANEOUS | 1 refills | Status: DC

## 2017-02-16 NOTE — Progress Notes (Signed)
Pre visit review using our clinic review tool, if applicable. No additional management support is needed unless otherwise documented below in the visit note. 

## 2017-02-16 NOTE — Progress Notes (Signed)
   Subjective:    Patient ID: Rockwell Alexandriaamika Mcelroy, female    DOB: 1975/09/18, 41 y.o.   MRN: [id number]  HPI Rash- first noticed on R arm ~2.5 days ago.  Itchy, some burning.  A few bumps on abdomen.  Not doing any recent yard work but has been outside.  No pain prior to rash eruption.  Son w/ shingles.     Review of Systems For ROS see HPI     Objective:   Physical Exam  Constitutional: She is oriented to person, place, and time. She appears well-developed and well-nourished. No distress.  HENT:  Head: Normocephalic and atraumatic.  Neurological: She is alert and oriented to person, place, and time.  Skin: Skin is warm and dry. Rash (vesicular, erythematous itchy rash in linear pattern on R arm at flexor crease and on abdomen) noted.  Psychiatric: She has a normal mood and affect. Her behavior is normal. Thought content normal.  Vitals reviewed.         Assessment & Plan:  Contact dermatitis- new.  Suspect this is plant exposure due to recent time outside.  Pt is not having any pain and rash is in at least 2 different dermatomes so shingles is very unlikely.  Start topical steroid ointment twice daily.  Reviewed supportive care and red flags that should prompt return.  Pt expressed understanding and is in agreement w/ plan.

## 2017-02-16 NOTE — Patient Instructions (Signed)
Follow up as needed/scheduled This appears to be something you came in contact w/ and NOT shingles!  (thank goodness!) Apply the Triamcinolone ointment twice daily as needed for itching Wash clothes, sheets, towels as the rash can spread if you come back into contact w/ the plant oil Use Claritin/Zyrtec/Benadryl as needed for itching Call with any questions or concerns- particularly if things aren't improving Hang in there!!! Have a great summer!

## 2017-03-24 ENCOUNTER — Encounter: Payer: Self-pay | Admitting: Family Medicine

## 2017-04-08 MED FILL — CARISOPRODOL 350 MG TABLET: 350 | 5 days supply | Qty: 20 | Fill #0

## 2017-04-15 ENCOUNTER — Encounter: Payer: Self-pay | Admitting: Family Medicine

## 2017-04-15 NOTE — Telephone Encounter (Signed)
LM for pt to call and schedule an appt.

## 2017-04-20 ENCOUNTER — Encounter: Payer: Self-pay | Admitting: Family Medicine

## 2017-04-22 ENCOUNTER — Encounter: Payer: Self-pay | Admitting: Family Medicine

## 2017-04-22 ENCOUNTER — Ambulatory Visit (INDEPENDENT_AMBULATORY_CARE_PROVIDER_SITE_OTHER): Payer: 59 | Admitting: Family Medicine

## 2017-04-22 VITALS — BP 128/88 | HR 85 | Temp 98.6°F | Resp 17 | Ht 65.0 in | Wt 174.2 lb

## 2017-04-22 DIAGNOSIS — H6983 Other specified disorders of Eustachian tube, bilateral: Secondary | ICD-10-CM

## 2017-04-22 MED ORDER — CETIRIZINE HCL 10 MG PO TABS: 10.0000 mg | ORAL_TABLET | Freq: Every day | ORAL | 11 refills | Status: DC

## 2017-04-22 MED ORDER — FLUTICASONE PROPIONATE 50 MCG/ACT NA SUSP: 2.0000 | Freq: Every day | NASAL | 6 refills | Status: DC

## 2017-04-22 MED FILL — FLUTICASONE PROP 50 MCG SPR: 50 | 30 days supply | Qty: 16 | Fill #0

## 2017-04-22 NOTE — Progress Notes (Signed)
Pre visit review using our clinic review tool, if applicable. No additional management support is needed unless otherwise documented below in the visit note. 

## 2017-04-22 NOTE — Patient Instructions (Signed)
Follow up as needed/scheduled Start daily Zyrtec daily and Flonase- 2 sprays each nostril daily Drink plenty of fluids Add OTC Sudafed for the next 3-5 days to speed relief of ear pain Call with any questions or concerns Hang in there!!!

## 2017-04-22 NOTE — Progress Notes (Signed)
   Subjective:    Patient ID: [name], female    DOB: [birth year], 41 y.o.   MRN: [id number]  HPI Ear pain- sxs started ~3 weeks ago.  Bilateral.  Also having pain in jaw.  Saw Dentist recommended mouthpiece for possible teeth grinding but that didn't help.  No relief w/ Meloxicam or Soma.  Normal dental xrays on panoram.  Pain is constant.  Worse w/ chewing.  Pt is now eating soft or liquid diet.   Review of Systems For ROS see HPI     Objective:   Physical Exam  Constitutional: She appears well-developed and well-nourished. No distress.  HENT:  Head: Normocephalic and atraumatic.  Right Ear: Tympanic membrane is retracted.  Left Ear: Tympanic membrane is retracted.  Nose: Mucosal edema and rhinorrhea present. Right sinus exhibits no maxillary sinus tenderness and no frontal sinus tenderness. Left sinus exhibits no maxillary sinus tenderness and no frontal sinus tenderness.  Mouth/Throat: Mucous membranes are normal. Posterior oropharyngeal erythema (w/ PND) present.  Eyes: Pupils are equal, round, and reactive to light. Conjunctivae and EOM are normal.  Neck: Normal range of motion. Neck supple.  Cardiovascular: Normal rate, regular rhythm and normal heart sounds.   Pulmonary/Chest: Effort normal and breath sounds normal. No respiratory distress. She has no wheezes. She has no rales.  Lymphadenopathy:    She has no cervical adenopathy.  Vitals reviewed.         Assessment & Plan:  Eustachian tube dysfxn- new.  Pt's sxs and PE consistent w/ eustachian tube dysfxn.  Start Flonase, Zyrtec, and sudafed short term.  Reviewed dx and supportive care.  Pt expressed understanding and is in agreement w/ plan.

## 2017-04-23 ENCOUNTER — Encounter: Payer: Self-pay | Admitting: Family Medicine

## 2017-04-23 MED ORDER — LOSARTAN POTASSIUM-HCTZ 100-12.5 MG PO TABS: 1.0000 | ORAL_TABLET | Freq: Every day | ORAL | 3 refills | Status: DC

## 2017-05-05 ENCOUNTER — Encounter: Payer: Self-pay | Admitting: Family Medicine

## 2017-05-06 ENCOUNTER — Other Ambulatory Visit: Payer: Self-pay | Admitting: *Deleted

## 2017-05-06 DIAGNOSIS — H6983 Other specified disorders of Eustachian tube, bilateral: Secondary | ICD-10-CM

## 2017-05-06 DIAGNOSIS — H9203 Otalgia, bilateral: Secondary | ICD-10-CM

## 2017-05-06 DIAGNOSIS — R6884 Jaw pain: Secondary | ICD-10-CM

## 2017-05-06 DIAGNOSIS — H9209 Otalgia, unspecified ear: Secondary | ICD-10-CM

## 2017-05-12 DIAGNOSIS — H6903 Patulous Eustachian tube, bilateral: Secondary | ICD-10-CM | POA: Diagnosis not present

## 2017-05-12 MED FILL — LOSARTAN-HCTZ 100-12.5 MG T: 100-12.5 | 30 days supply | Qty: 30 | Fill #0

## 2017-06-01 DIAGNOSIS — H6903 Patulous Eustachian tube, bilateral: Secondary | ICD-10-CM | POA: Diagnosis not present

## 2017-06-07 ENCOUNTER — Encounter: Payer: Self-pay | Admitting: Family Medicine

## 2017-06-11 ENCOUNTER — Ambulatory Visit (INDEPENDENT_AMBULATORY_CARE_PROVIDER_SITE_OTHER): Payer: 59 | Admitting: Family Medicine

## 2017-06-11 ENCOUNTER — Encounter: Payer: Self-pay | Admitting: Family Medicine

## 2017-06-11 VITALS — BP 140/96 | HR 88 | Temp 98.7°F | Ht 65.0 in | Wt 179.0 lb

## 2017-06-11 DIAGNOSIS — H6983 Other specified disorders of Eustachian tube, bilateral: Secondary | ICD-10-CM

## 2017-06-11 DIAGNOSIS — I10 Essential (primary) hypertension: Secondary | ICD-10-CM | POA: Diagnosis not present

## 2017-06-11 DIAGNOSIS — H65191 Other acute nonsuppurative otitis media, right ear: Secondary | ICD-10-CM | POA: Diagnosis not present

## 2017-06-11 DIAGNOSIS — J0100 Acute maxillary sinusitis, unspecified: Secondary | ICD-10-CM

## 2017-06-11 DIAGNOSIS — H6993 Unspecified Eustachian tube disorder, bilateral: Secondary | ICD-10-CM

## 2017-06-11 MED ORDER — LOSARTAN POTASSIUM-HCTZ 100-25 MG PO TABS
1.0000 | ORAL_TABLET | Freq: Every day | ORAL | 3 refills | Status: DC
Start: 2017-06-11 — End: 2018-07-14

## 2017-06-11 MED ORDER — AMOXICILLIN-POT CLAVULANATE 875-125 MG PO TABS
1.0000 | ORAL_TABLET | Freq: Two times a day (BID) | ORAL | 0 refills | Status: DC
Start: 2017-06-11 — End: 2017-08-07

## 2017-06-11 MED FILL — AMOX-CLAV 875-125 MG TABLET: 875-125 | 10 days supply | Qty: 20 | Fill #0

## 2017-06-11 MED FILL — LOSARTAN-HCTZ 100-25 MG TAB: 100-25 | 90 days supply | Qty: 90 | Fill #0 | Status: TO

## 2017-06-11 NOTE — Progress Notes (Signed)
HPI:  Acute visit for R ear pain and maxillary sinus pain -started: ear issues on and off for months, seeing ENT and recently started flonase and zyrtec -symptoms:this week nasal congestion, sore throat, R maxillary sinus pain -denies:fever, SOB, NVD, rash, body aches -has tried: lots of different over the counter medications -sick contacts/travel/risks: sick family members with virus/cold -Hx of: allergies, HTN - up today and reports is often up  ROS: See pertinent positives and negatives per HPI.  Past Medical History:  Diagnosis Date  . Arthritis    right foot  . Congenital flat foot    has had bilateral foot surgery to correct  . Hypertension   . Irritable bowel syndrome (IBS)    no current med.  . Painful orthopaedic hardware [facility] 03/2013   right foot    Past Surgical History:  Procedure Laterality Date  . CALCANEAL OSTEOTOMY Right 12/23/2012   Procedure: RIGHT CALCANEAL OSTEOTOMY, RIGHT LATERAL COLUMN LENGTHENING, RIGHT COTTON OSTEOTOMY, RIGHT FLEX DIGITORIUM LONGUS TRANSFER, RIGHT KIDNER PROCEDURE ;  Surgeon: Toni Arthurs, MD;  Location: [facility];  Service: Orthopedics;  Laterality: Right;  . CESAREAN SECTION  07/13/2005; 06/03/2006  . DILATION AND EVACUATION  08/16/2008  . FOOT SURGERY Left 2002  . GASTROCNEMIUS RECESSION Right 12/23/2012   Procedure: RIGHT GASTRO RECESSION ;  Surgeon: Toni Arthurs, MD;  Location: [facility];  Service: Orthopedics;  Laterality: Right;  . HARDWARE REMOVAL Right 03/24/2013   Procedure: HARDWARE REMOVAL RIGHT HEEL;  Surgeon: Toni Arthurs, MD;  Location: [facility];  Service: Orthopedics;  Laterality: Right;  . WISDOM TOOTH EXTRACTION      Family History  Problem Relation Age of Onset  . Hypertension Mother   . Hypertension Father   . Diabetes Father     Social History   Social History  . Marital status: Married    Spouse name: N/A  . Number of children: N/A  . Years of education: N/A    Social History Main Topics  . Smoking status: Never Smoker  . Smokeless tobacco: Never Used  . Alcohol use No  . Drug use: No  . Sexual activity: Yes    Birth control/ protection: IUD   Other Topics Concern  . None   Social History Narrative  . None     Current Outpatient Prescriptions:  .  amoxicillin-clavulanate (AUGMENTIN) 875-125 MG tablet, Take 1 tablet by mouth 2 (two) times daily., Disp: 20 tablet, Rfl: 0 .  losartan-hydrochlorothiazide (HYZAAR) 100-25 MG tablet, Take 1 tablet by mouth daily., Disp: 90 tablet, Rfl: 3  EXAM:  Vitals:   06/11/17 1527  BP: (!) 140/96  Pulse: 88  Temp: 98.7 F (37.1 C)  SpO2: 98%    Body mass index is 29.79 kg/m.  GENERAL: vitals reviewed and listed above, alert, oriented, appears well hydrated and in no acute distress  HEENT: atraumatic, conjunttiva clear, no obvious abnormalities on inspection of external nose and ears, normal appearance of ear canals and TMs except for R TM bulging with discolored effusion and mild erythema TM, thick nasal congestion, mild post oropharyngeal erythema with PND, no tonsillar edema or exudate, R maxillary sinus TTP  NECK: no obvious masses on inspection  LUNGS: clear to auscultation bilaterally, no wheezes, rales or rhonchi, good air movement  CV: HRRR, no peripheral edema  MS: moves all extremities without noticeable abnormality  PSYCH/NEURO: pleasant and cooperative, no obvious depression or anxiety, CN II-XII grossly intact, finger to nose normal  ASSESSMENT  AND PLAN:  Discussed the following assessment and plan:  Dysfunction of both eustachian tubes  Acute MEE (middle ear effusion), right  Acute non-recurrent maxillary sinusitis  Essential hypertension  - We discussed potential etiologies, with sinusitis, developing R OM, ETD likely. We discussed treatment side effects, likely course, antibiotic misuse developing a serious illness. Opted to treat with augmentin. BP up and she  reports this is not unusual and is interested in increasing BP med. New Rx sent. Advised follow up with PCP in 10-14 days and will need BMP. Also imaging if pain persists. Advised against nsaids and other OTC medications that elevate BP. -of course, we advised to return or notify a doctor immediately if symptoms worsen or persist or new concerns arise.    Patient Instructions  BEFORE YOU LEAVE: -follow up: with your doctor in about 10-14 days  Start the Augmentin for the sinus infection.  Continue the flonase and the zyrtec and add the massage for the ear fluid issues.  I sent a new dose of your blood pressure medication.  I hope you are feeling better soon! Seek care immediately if worsening, new concerns or you are not improving with treatment.        Kriste Basque R., DO

## 2017-06-11 NOTE — Patient Instructions (Addendum)
BEFORE YOU LEAVE: -follow up: with your doctor in about 10-14 days  Start the Augmentin for the sinus infection.  Continue the flonase and the zyrtec and add the massage for the ear fluid issues.  I sent a new dose of your blood pressure medication.  I hope you are feeling better soon! Seek care immediately if worsening, new concerns or you are not improving with treatment.

## 2017-06-12 ENCOUNTER — Telehealth: Payer: Self-pay | Admitting: Family Medicine

## 2017-06-12 MED ORDER — AZITHROMYCIN 250 MG PO TABS: ORAL_TABLET | ORAL | 0 refills | Status: DC

## 2017-06-12 NOTE — Telephone Encounter (Signed)
Pt is calling stating that the antibiotic that was given to her has upset her stomach she has been nauseated and diarrhea and would like to see if a different antibiotic can be called in.  [facility]

## 2017-06-12 NOTE — Telephone Encounter (Signed)
If pulse is normal sitting and standing not sure what to make of that. She certainly should seek emergency care if concerns about her heart, chest pain, dehydration, worsening, etc. Otherwise should call her regular office for appt if she feels needs to be evaluated today. Thank you.

## 2017-06-12 NOTE — Telephone Encounter (Signed)
I called the pt and informed her of the message below and she agreed to call her PCP's office.

## 2017-06-12 NOTE — Telephone Encounter (Signed)
Could try zpack instead if she wishes?Almost all antibiotic can upset the stomach unfortunately.

## 2017-06-12 NOTE — Telephone Encounter (Signed)
I called the pt and informed her of the message below.  She is aware a Z-pak was sent to her pharmacy.  She also wanted to let Dr Selena Batten know her heart rate is in the 120s when she lies down since this AM and she questioned what to do?  Message sent to Dr Selena Batten.

## 2017-06-15 ENCOUNTER — Telehealth: Payer: Self-pay | Admitting: Family Medicine

## 2017-06-15 ENCOUNTER — Ambulatory Visit: Payer: 59 | Admitting: Family Medicine

## 2017-06-15 NOTE — Telephone Encounter (Signed)
Noted.  As we did not hear from patient and did not get the call until after the appt, will leave pt on the schedule

## 2017-06-15 NOTE — Telephone Encounter (Signed)
Received call from pt father at 10:45 stating that pt would not be able to make appt today at 9:30 due to starting a new job. I told father that we require a 24 hour notice for all cancellations, he stated that he just got the message to call us.

## 2017-06-19 MED FILL — AZITHROMYCIN 250 MG TAB: 250 | 5 days supply | Qty: 6 | Fill #0

## 2017-06-25 ENCOUNTER — Encounter: Payer: Self-pay | Admitting: Family Medicine

## 2017-06-25 MED ORDER — PREDNISONE 10 MG PO TABS: ORAL_TABLET | ORAL | 0 refills | Status: DC

## 2017-06-25 MED ORDER — ALBUTEROL SULFATE HFA 108 (90 BASE) MCG/ACT IN AERS: 2.0000 | INHALATION_SPRAY | Freq: Four times a day (QID) | RESPIRATORY_TRACT | 2 refills | Status: DC | PRN

## 2017-06-25 MED FILL — VENTOLIN HFA 90 MCG INHALER: 108 (90 BAS | 25 days supply | Qty: 18 | Fill #0

## 2017-06-25 MED FILL — predniSONE 10 MG TABS: 10 | 9 days supply | Qty: 18 | Fill #0

## 2017-07-22 ENCOUNTER — Encounter: Payer: Self-pay | Admitting: Family Medicine

## 2017-07-22 NOTE — Telephone Encounter (Signed)
Spoke with patient regarding symptoms. Patient reports persistent non-productive cough with occasional DOE, afebrile. Has used Prednisone and inhalers as prescribed by PCP.  Offered appointment for evaluation, patient declines d/t work hours. Patient states she will go to Urgent Care on Friday, 07/24/17 since she is off work that day.  Encouraged to use OTC cough suppressant, humidifier and increase fluid intake.

## 2017-07-27 DIAGNOSIS — J019 Acute sinusitis, unspecified: Secondary | ICD-10-CM | POA: Diagnosis not present

## 2017-07-27 DIAGNOSIS — R05 Cough: Secondary | ICD-10-CM | POA: Diagnosis not present

## 2017-07-28 ENCOUNTER — Other Ambulatory Visit: Payer: Self-pay | Admitting: *Deleted

## 2017-07-28 NOTE — Patient Outreach (Signed)
Closing case to the Hypertension [facility] To Wellness program since all hypertension disease self  management assistance will be provided by [employer] for [facility] plan members in 2019. Bary RichardJanet S. Hauser RN,CCM,CDE [facility] Care Management Coordinator [facility] and [facility] Office Phone [phone] Office Fax [phone].

## 2017-08-07 ENCOUNTER — Ambulatory Visit (INDEPENDENT_AMBULATORY_CARE_PROVIDER_SITE_OTHER): Payer: 59

## 2017-08-07 ENCOUNTER — Ambulatory Visit (INDEPENDENT_AMBULATORY_CARE_PROVIDER_SITE_OTHER): Payer: 59 | Admitting: Family Medicine

## 2017-08-07 ENCOUNTER — Encounter: Payer: Self-pay | Admitting: Family Medicine

## 2017-08-07 ENCOUNTER — Other Ambulatory Visit: Payer: Self-pay

## 2017-08-07 VITALS — BP 121/62 | HR 78 | Temp 98.6°F | Resp 16 | Ht 65.0 in | Wt 176.4 lb

## 2017-08-07 DIAGNOSIS — J984 Other disorders of lung: Secondary | ICD-10-CM | POA: Diagnosis not present

## 2017-08-07 DIAGNOSIS — R05 Cough: Secondary | ICD-10-CM

## 2017-08-07 DIAGNOSIS — R059 Cough, unspecified: Secondary | ICD-10-CM

## 2017-08-07 MED ORDER — IPRATROPIUM-ALBUTEROL 0.5-2.5 (3) MG/3ML IN SOLN
3.0000 mL | Freq: Once | RESPIRATORY_TRACT | Status: AC
  Administered 2017-08-07: 3 mL via RESPIRATORY_TRACT

## 2017-08-07 MED ORDER — AZITHROMYCIN 250 MG PO TABS: ORAL_TABLET | ORAL | 0 refills | Status: DC

## 2017-08-07 MED ORDER — BECLOMETHASONE DIPROP HFA 80 MCG/ACT IN AERB: 1.0000 | INHALATION_SPRAY | Freq: Two times a day (BID) | RESPIRATORY_TRACT | 0 refills | Status: DC

## 2017-08-07 MED ORDER — PROMETHAZINE-DM 6.25-15 MG/5ML PO SYRP: 5.0000 mL | ORAL_SOLUTION | Freq: Four times a day (QID) | ORAL | 0 refills | Status: DC | PRN

## 2017-08-07 MED FILL — AZITHROMYCIN 250 MG TAB: 250 | 5 days supply | Qty: 6 | Fill #0

## 2017-08-07 MED FILL — QVAR REDIHALER 80 MCG/ACT A: 80 | 30 days supply | Qty: 11 | Fill #0

## 2017-08-07 MED FILL — PROMETHAZINE-DM SYRUP: 6.25-15 | 12 days supply | Qty: 240 | Fill #0

## 2017-08-07 NOTE — Patient Instructions (Signed)
Go to [facility] and get your chest xray We'll notify you of your results START the Zpack as directed Use the Qvar- 1 puff twice daily- until feeling better Use the Albuterol as needed for cough, shortness of breath Mucinex DM for daytime cough Promethazine cough syrup for nights and weekends- may cause drowsiness Call with any questions or concerns Hang in there! Merry Christmas!!!

## 2017-08-07 NOTE — Progress Notes (Signed)
   Subjective:    Patient ID: [name], female    DOB: 1976/04/16, 41 y.o.   MRN: [id number]  HPI Cough- pt reports chest congestion and cough for 'over a month'.  Now having burning in her chest w/ a deep breath.  + SOB even with speaking complete sentences.  No wheezing.  No fevers.  Cough is dry but at times feels as if it should be productive.  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     Review of Systems For ROS see HPI     Objective:   Physical Exam  Constitutional: She is oriented to person, place, and time. She appears well-developed and well-nourished. No distress.  HENT:  Head: Normocephalic and atraumatic.  TMs normal bilaterally Mild nasal congestion Throat w/out erythema, edema, or exudate  Eyes: Conjunctivae and EOM are normal. Pupils are equal, round, and reactive to light.  Neck: Normal range of motion. Neck supple.  Cardiovascular: Normal rate, regular rhythm, normal heart sounds and intact distal pulses.  No murmur heard. Pulmonary/Chest: Effort normal. No respiratory distress. She has no wheezes.  + hacking cough- improved s/p neb tx Very poor air movement throughout- improved s/p neb tx  Lymphadenopathy:    She has no cervical adenopathy.  Neurological: She is alert and oriented to person, place, and time.  Vitals reviewed.         Assessment & Plan:  Cough- new to provider, ongoing for >1 month.  Concern for atypical infxn like mycoplasma pna.  Cough and air movement improved s/p neb tx.  Get CXR.  Start Zpack.  Qvar twice daily.  Albuterol prn.  Cough meds prn.  After CXR available for review may consider oral steroids.  Reviewed supportive care and red flags that should prompt return.  Pt expressed understanding and is in agreement w/ plan.

## 2017-08-07 NOTE — Addendum Note (Signed)
Addended by: Geannie RisenBRODMERKEL, Faron Whitelock L on: 08/07/2017 10:13 AM   Modules accepted: Orders

## 2017-10-19 ENCOUNTER — Other Ambulatory Visit: Payer: Self-pay

## 2017-10-19 ENCOUNTER — Encounter: Payer: Self-pay | Admitting: Family Medicine

## 2017-10-19 ENCOUNTER — Ambulatory Visit (INDEPENDENT_AMBULATORY_CARE_PROVIDER_SITE_OTHER): Payer: No Typology Code available for payment source | Admitting: Family Medicine

## 2017-10-19 ENCOUNTER — Ambulatory Visit (INDEPENDENT_AMBULATORY_CARE_PROVIDER_SITE_OTHER): Payer: No Typology Code available for payment source

## 2017-10-19 VITALS — BP 142/88 | HR 63 | Temp 98.5°F | Ht 65.0 in | Wt 168.2 lb

## 2017-10-19 DIAGNOSIS — M79672 Pain in left foot: Secondary | ICD-10-CM

## 2017-10-19 DIAGNOSIS — S93402A Sprain of unspecified ligament of left ankle, initial encounter: Secondary | ICD-10-CM

## 2017-10-19 MED ORDER — DICLOFENAC SODIUM 75 MG PO TBEC: 75.0000 mg | DELAYED_RELEASE_TABLET | Freq: Two times a day (BID) | ORAL | 0 refills | Status: DC

## 2017-10-19 MED FILL — DICLOFENAC SODIUM 75 MG TAB: 75 | 15 days supply | Qty: 30 | Fill #0

## 2017-10-19 NOTE — Progress Notes (Signed)
Subjective  CC:  Chief Complaint  Patient presents with  . Foot Pain    Left Sided Foot Pain x 4-5 days     HPI: [name] is a 42 y.o. female who presents to the office today to address the problems listed above in the chief complaint.  42 yo female with h/o ped planus s/p surgical repair years ago presents with lateral foot and ankle pain. She started back dancing (praise dancing) about 4-5 weeks ago. About a week ago, she may have twisted her ankle; she noted AFTER the practice acute pain associated with swelling and caused her to limp. Since she has had persistent pain, worse after dancing. She is taking ibuprofen with some relief. No redness or warmth. No knee pain or calf pain. DOI 10/12/2017   I reviewed the patients updated PMH, FH, and SocHx.    Patient Active Problem List   Diagnosis Date Noted  . Anxiety state 04/29/2016  . Atypical chest pain 02/22/2016  . Dysphagia, pharyngoesophageal phase 02/22/2016  . Physical exam 12/15/2014  . Left hip pain 09/29/2014  . Radicular low back pain 09/11/2014  . Bilateral hip bursitis 09/11/2014  . BPV (benign positional vertigo) 02/22/2014  . HTN (hypertension) 02/22/2014   Current Meds  Medication Sig  . beclomethasone (QVAR REDIHALER) 80 MCG/ACT inhaler Inhale 1 puff into the lungs 2 (two) times daily.  Marland Kitchen losartan-hydrochlorothiazide (HYZAAR) 100-25 MG tablet Take 1 tablet by mouth daily.    Allergies: Patient is allergic to augmentin [amoxicillin-pot clavulanate] and sulfa antibiotics. Family History: Patient family history includes Diabetes in her father; Hypertension in her father and mother. Social History:  Patient  reports that  has never smoked. she has never used smokeless tobacco. She reports that she does not drink alcohol or use drugs.  Review of Systems: Constitutional: Negative for fever malaise or anorexia Cardiovascular: negative for chest pain Respiratory: negative for SOB or persistent  cough Gastrointestinal: negative for abdominal pain  Objective  Vitals: BP (!) 142/88   Pulse 63   Temp 98.5 F (36.9 C) (Oral)   Ht 5\' 5"  (1.651 m)   Wt 168 lb 3.2 oz (76.3 kg)   LMP 10/18/2017   BMI 27.99 kg/m  General: no acute distress , A&Ox3 Physical Exam  Musculoskeletal:       Left ankle: She exhibits swelling. She exhibits normal range of motion, no ecchymosis, no deformity, no laceration and normal pulse. Tenderness. AITFL, CF ligament and posterior TFL tenderness found. No lateral malleolus, no medial malleolus and no proximal fibula tenderness found. Achilles tendon exhibits no pain.       Left foot: There is tenderness (over dorsal lateral tarsal tunnel). There is normal range of motion, no bony tenderness, no swelling, normal capillary refill, no crepitus and no deformity.    Assessment  1. Left foot pain   2. Sprain of left ankle, unspecified ligament, initial encounter      Plan   Ankle sprain +/- extensor tendonitis:  RICE; figure of 8 splint. Education on care/rest/spint x 1-2 weeks and no dancing. recheckin 2 weeks and start rehab exercises if pain free. Discussed rom exercises now. voltaren for nsaid ordered.  Xray due to still limping.   Follow up: Return in about 2 weeks (around 11/02/2017) for recheck.    Commons side effects, risks, benefits, and alternatives for medications and treatment plan prescribed today were discussed, and the patient expressed understanding of the given instructions. Patient is instructed to call or message  via MyChart if he/she has any questions or concerns regarding our treatment plan. No barriers to understanding were identified. We discussed Red Flag symptoms and signs in detail. Patient expressed understanding regarding what to do in case of urgent or emergency type symptoms.   Medication list was reconciled, printed and provided to the patient in AVS. Patient instructions and summary information was reviewed with the patient as  documented in the AVS. This note was prepared with assistance of Dragon voice recognition software. Occasional wrong-word or sound-a-like substitutions may have occurred due to the inherent limitations of voice recognition software  Orders Placed This Encounter  Procedures  . DG Foot Complete Left   Meds ordered this encounter  Medications  . diclofenac (VOLTAREN) 75 MG EC tablet    Sig: Take 1 tablet (75 mg total) by mouth 2 (two) times daily.    Dispense:  30 tablet    Refill:  0

## 2017-10-19 NOTE — Patient Instructions (Addendum)
Please return in 2-3 weeks with PCP for follow up on ankle pain  Wear the ankle support for the next 1-2 weeks and no dancing until pain free.   If you have any questions or concerns, please don't hesitate to send me a message via MyChart or call the office at [phone]. Thank you for visiting with us today! It's our pleasure caring for you.    Ankle Sprain An ankle sprain is a stretch or tear in one of the tough, fiber-like tissues (ligaments) in the ankle. The ligaments in your ankle help to hold the bones of the ankle together. What are the causes? This condition is often caused by stepping on or falling on the outer edge of the foot. What increases the risk? This condition is more likely to develop in people who play sports. What are the signs or symptoms? Symptoms of this condition include:  Pain in your ankle.  Swelling.  Bruising. Bruising may develop right after you sprain your ankle or 1-2 days later.  Trouble standing or walking, especially when you turn or change directions.  How is this diagnosed? This condition is diagnosed with a physical exam. During the exam, your health care provider will press on certain parts of your foot and ankle and try to move them in certain ways. X-rays may be taken to see how severe the sprain is and to check for broken bones. How is this treated? This condition may be treated with:  A brace. This is used to keep the ankle from moving until it heals.  An elastic bandage. This is used to support the ankle.  Crutches.  Pain medicine.  Surgery. This may be needed if the sprain is severe.  Physical therapy. This may help to improve the range of motion in the ankle.  Follow these instructions at home:  Rest your ankle.  Take over-the-counter and prescription medicines only as told by your health care provider.  For 2-3 days, keep your ankle raised (elevated) above the level of your heart as much as possible.  If directed,  apply ice to the area: ? Put ice in a plastic bag. ? Place a towel between your skin and the bag. ? Leave the ice on for 20 minutes, 2-3 times a day.  If you were given a brace: ? Wear it as directed. ? Remove it to shower or bathe. ? Try not to move your ankle much, but wiggle your toes from time to time. This helps to prevent swelling.  If you were given an elastic bandage (dressing): ? Remove it to shower or bathe. ? Try not to move your ankle much, but wiggle your toes from time to time. This helps to prevent swelling. ? Adjust the dressing to make it more comfortable if it feels too tight. ? Loosen the dressing if you have numbness or tingling in your foot, or if your foot becomes cold and blue.  If you have crutches, use them as told by your health care provider. Continue to use them until you can walk without feeling pain in your ankle. Contact a health care provider if:  You have rapidly increasing bruising or swelling.  Your pain is not relieved with medicine. Get help right away if:  Your toes or foot becomes numb or blue.  You have severe pain that gets worse. This information is not intended to replace advice given to you by your health care provider. Make sure you discuss any questions you have with your  health care provider. Document Released: 08/18/2005 Document Revised: 09/25/2016 Document Reviewed: 03/20/2015 Elsevier Interactive Patient Education  [employer].

## 2017-11-02 ENCOUNTER — Encounter: Payer: Self-pay | Admitting: Family Medicine

## 2017-11-02 ENCOUNTER — Ambulatory Visit (INDEPENDENT_AMBULATORY_CARE_PROVIDER_SITE_OTHER): Payer: No Typology Code available for payment source | Admitting: Family Medicine

## 2017-11-02 ENCOUNTER — Ambulatory Visit (INDEPENDENT_AMBULATORY_CARE_PROVIDER_SITE_OTHER): Payer: No Typology Code available for payment source

## 2017-11-02 ENCOUNTER — Other Ambulatory Visit: Payer: Self-pay

## 2017-11-02 VITALS — BP 130/80 | HR 76 | Temp 99.1°F | Resp 16 | Ht 65.0 in | Wt 169.4 lb

## 2017-11-02 DIAGNOSIS — M25572 Pain in left ankle and joints of left foot: Secondary | ICD-10-CM

## 2017-11-02 NOTE — Progress Notes (Signed)
   Subjective:    Patient ID: [name], female    DOB: Jul 09, 1976, 42 y.o.   MRN: [id number]  HPI L ankle pain- pt was seen 2/18 and had negative xray at that time.  Pt is complaining of pain down the front of the ankle, extending onto dorsum of foot and up into lower leg.  Pain also radiates around to lateral malleolus.  Able to stand w/ discomfort and walking is painful.  Ankle will swell as day goes on.  Pt is wearing brace while up and about.  Minimal relief w/ diclofenac and tylenol.  Pt has had foot surgery w/ Dr Victorino DikeHewitt- appt upcoming 3/25.   Review of Systems For ROS see HPI     Objective:   Physical Exam  Constitutional: She is oriented to person, place, and time. She appears well-developed and well-nourished. No distress.  Cardiovascular: Intact distal pulses.  Musculoskeletal: She exhibits tenderness (TTP over L ankle mortise, L lateral malleolus and bursa.  Pain w/ dorsiflexion > plantar flexion).  Neurological: She is alert and oriented to person, place, and time.  Skin: Skin is warm and dry. No rash noted. No erythema.  Vitals reviewed.         Assessment & Plan:  L ankle pain- no improvement in 2 weeks.  Reviewed pt's L foot xrays but pt's pain is localized to ankle mortis and she needs repeat imaging.  Rather than ordering the imaging and then referring, will refer to Ortho as pt has established relationship w/ Dr Victorino DikeHewitt.  Will refer and call office to try and get her scheduled.  Pt expressed understanding and is in agreement w/ plan.

## 2017-11-02 NOTE — Patient Instructions (Signed)
Follow up as needed or as scheduled Continue tylenol and ibuprofen as needed for pain Continue to wear your brace ICE! Call with any questions or concerns Hang in there!!!

## 2017-11-02 NOTE — Addendum Note (Signed)
Addended by: Geannie RisenBRODMERKEL, JESSICA L on: 11/02/2017 11:17 AM   Modules accepted: Orders

## 2017-11-17 ENCOUNTER — Encounter: Payer: Self-pay | Admitting: Family Medicine

## 2017-11-17 MED ORDER — DICLOFENAC SODIUM 1 % TD GEL: 4.0000 g | Freq: Four times a day (QID) | TRANSDERMAL | 3 refills | Status: DC

## 2017-11-17 MED FILL — DICLOFENAC SODIUM 1% GEL: 1 | 7 days supply | Qty: 100 | Fill #0

## 2017-12-07 ENCOUNTER — Encounter: Payer: Self-pay | Admitting: Family Medicine

## 2017-12-08 ENCOUNTER — Other Ambulatory Visit: Payer: Self-pay | Admitting: *Deleted

## 2017-12-08 DIAGNOSIS — M76829 Posterior tibial tendinitis, unspecified leg: Secondary | ICD-10-CM

## 2017-12-08 DIAGNOSIS — M19079 Primary osteoarthritis, unspecified ankle and foot: Secondary | ICD-10-CM

## 2017-12-09 ENCOUNTER — Encounter: Payer: Self-pay | Admitting: Family Medicine

## 2018-01-04 ENCOUNTER — Other Ambulatory Visit: Payer: Self-pay | Admitting: Family Medicine

## 2018-01-04 MED FILL — LOSARTAN-HCTZ 100-25 MG TAB: 100-25 | 90 days supply | Qty: 90 | Fill #0

## 2018-01-04 MED FILL — DICLOFENAC SOD EC 75 MG TAB: 75 | 15 days supply | Qty: 30 | Fill #0

## 2018-01-04 NOTE — Telephone Encounter (Signed)
Last OV 11/02/17, No future OV  Last filled 10/19/17, # 30 with 0 refills.  Still okay to refill or is Dr. Victorino Dike taking over?  Please advise.

## 2018-01-08 ENCOUNTER — Encounter: Payer: Self-pay | Admitting: Family Medicine

## 2018-01-08 MED ORDER — FLUCONAZOLE 150 MG PO TABS: 150.0000 mg | ORAL_TABLET | Freq: Once | ORAL | 0 refills | Status: AC

## 2018-01-08 MED FILL — FLUCONAZOLE 150 MG TABS: 150 | 1 days supply | Qty: 1 | Fill #0

## 2018-01-18 ENCOUNTER — Ambulatory Visit (HOSPITAL_COMMUNITY)
Admission: EM | Admit: 2018-01-18 | Discharge: 2018-01-18 | Disposition: A | Discharge status: Home / Self Care | Payer: No Typology Code available for payment source | Attending: Internal Medicine | Admitting: Internal Medicine

## 2018-01-18 ENCOUNTER — Encounter: Payer: Self-pay | Admitting: Family Medicine

## 2018-01-18 ENCOUNTER — Encounter (HOSPITAL_COMMUNITY): Payer: Self-pay | Admitting: Emergency Medicine

## 2018-01-18 DIAGNOSIS — I10 Essential (primary) hypertension: Secondary | ICD-10-CM | POA: Insufficient documentation

## 2018-01-18 DIAGNOSIS — Z833 Family history of diabetes mellitus: Secondary | ICD-10-CM | POA: Diagnosis not present

## 2018-01-18 DIAGNOSIS — Z882 Allergy status to sulfonamides status: Secondary | ICD-10-CM | POA: Diagnosis not present

## 2018-01-18 DIAGNOSIS — K589 Irritable bowel syndrome without diarrhea: Secondary | ICD-10-CM | POA: Diagnosis not present

## 2018-01-18 DIAGNOSIS — M19071 Primary osteoarthritis, right ankle and foot: Secondary | ICD-10-CM | POA: Insufficient documentation

## 2018-01-18 DIAGNOSIS — Z8249 Family history of ischemic heart disease and other diseases of the circulatory system: Secondary | ICD-10-CM | POA: Diagnosis not present

## 2018-01-18 DIAGNOSIS — M7989 Other specified soft tissue disorders: Secondary | ICD-10-CM

## 2018-01-18 DIAGNOSIS — Z79899 Other long term (current) drug therapy: Secondary | ICD-10-CM | POA: Insufficient documentation

## 2018-01-18 DIAGNOSIS — R030 Elevated blood-pressure reading, without diagnosis of hypertension: Secondary | ICD-10-CM | POA: Diagnosis not present

## 2018-01-18 DIAGNOSIS — Z7951 Long term (current) use of inhaled steroids: Secondary | ICD-10-CM | POA: Diagnosis not present

## 2018-01-18 DIAGNOSIS — Z880 Allergy status to penicillin: Secondary | ICD-10-CM | POA: Insufficient documentation

## 2018-01-18 LAB — CBC WITH DIFFERENTIAL/PLATELET
ABS IMMATURE GRANULOCYTES: 0 10*3/uL (ref 0.0–0.1)
Basophils Absolute: 0.1 10*3/uL (ref 0.0–0.1)
Basophils Relative: 2 %
EOS ABS: 0.2 10*3/uL (ref 0.0–0.7)
Eosinophils Relative: 3 %
HEMATOCRIT: 35.3 % — AB (ref 36.0–46.0)
Hemoglobin: 11.7 g/dL — ABNORMAL LOW (ref 12.0–15.0)
IMMATURE GRANULOCYTES: 0 %
LYMPHS ABS: 1.5 10*3/uL (ref 0.7–4.0)
Lymphocytes Relative: 31 %
MCH: 29.6 pg (ref 26.0–34.0)
MCHC: 33.1 g/dL (ref 30.0–36.0)
MCV: 89.4 fL (ref 78.0–100.0)
MONO ABS: 0.4 10*3/uL (ref 0.1–1.0)
MONOS PCT: 9 %
NEUTROS ABS: 2.7 10*3/uL (ref 1.7–7.7)
Neutrophils Relative %: 55 %
Platelets: 316 10*3/uL (ref 150–400)
RBC: 3.95 MIL/uL (ref 3.87–5.11)
RDW: 12.5 % (ref 11.5–15.5)
WBC: 4.9 10*3/uL (ref 4.0–10.5)

## 2018-01-18 LAB — COMPREHENSIVE METABOLIC PANEL
ALT: 32 U/L (ref 14–54)
AST: 20 U/L (ref 15–41)
Albumin: 3.6 g/dL (ref 3.5–5.0)
Alkaline Phosphatase: 45 U/L (ref 38–126)
Anion gap: 8 (ref 5–15)
BILIRUBIN TOTAL: 0.4 mg/dL (ref 0.3–1.2)
CALCIUM: 8.6 mg/dL — AB (ref 8.9–10.3)
CO2: 27 mmol/L (ref 22–32)
CREATININE: 0.73 mg/dL (ref 0.44–1.00)
Chloride: 105 mmol/L (ref 101–111)
GFR calc non Af Amer: >60 mL/min (ref >60)
Glucose, Bld: 92 mg/dL (ref 65–99)
Potassium: 3.5 mmol/L (ref 3.5–5.1)
Sodium: 140 mmol/L (ref 135–145)
Total Protein: 6.5 g/dL (ref 6.5–8.1)

## 2018-01-18 MED ORDER — CLONIDINE HCL 0.1 MG PO TABS
0.1000 mg | ORAL_TABLET | Freq: Once | ORAL | Status: AC
  Administered 2018-01-18: 0.1 mg via ORAL

## 2018-01-18 MED ORDER — CLONIDINE HCL 0.1 MG PO TABS
ORAL_TABLET | ORAL | Status: AC
  Filled 2018-01-18: qty 1

## 2018-01-18 MED ORDER — CLONIDINE HCL 0.1 MG PO TABS: 0.1000 mg | ORAL_TABLET | Freq: Two times a day (BID) | ORAL | 0 refills | Status: DC | PRN

## 2018-01-18 NOTE — ED Triage Notes (Signed)
PT reports bilateral foot swelling since Saturday. Swelling started moving up lower legs today. PT checked her BP at work and it was 181/110. PT takes losartan- HCTZ for BP and has not missed any meds. PT reports increased work of breathing today walking up the stairs.

## 2018-01-18 NOTE — Discharge Instructions (Addendum)
Labs including blood count and chemistries/liver/kidney tests are pending.  The urgent care will contact you if these need urgent reassessment.  Clonidine 0.1 mg x 1 dose was given at the urgent care tonight with good improvement in blood pressure.  A prescription for this was sent to the pharmacy, to take if leg swelling increases and bp >160/110.  Followup with Dr Beverely Low to discuss next steps.

## 2018-01-18 NOTE — ED Provider Notes (Signed)
[facility]    CSN: [id number] Arrival date & time: 01/18/18  1740     History   Chief Complaint Chief Complaint  Patient presents with  . Leg Swelling    HPI [name] is a 42 y.o. female.   She presents today with swelling of her feet and distal legs bilaterally over the last 2 or 3 days, intermittently pitting/tight edema.  Blood pressure has been more elevated than usual, 160s over 100-110s.  No chest pain, not short of breath although she does have maybe a little more breathlessness than usual when she is up and around.  No headache.  Has been in contact with her PCP, who recommended some labs and evaluation.  Patient is a Engineer, civil (consulting), does not want to call out for personal dr visit if possible.  HPI  Past Medical History:  Diagnosis Date  . Arthritis    right foot  . Congenital flat foot    has had bilateral foot surgery to correct  . Hypertension   . Irritable bowel syndrome (IBS)    no current med.  . Painful orthopaedic hardware [facility]) 03/2013   right foot    Patient Active Problem List   Diagnosis Date Noted  . Anxiety state 04/29/2016  . Atypical chest pain 02/22/2016  . Dysphagia, pharyngoesophageal phase 02/22/2016  . Physical exam 12/15/2014  . Left hip pain 09/29/2014  . Radicular low back pain 09/11/2014  . Bilateral hip bursitis 09/11/2014  . BPV (benign positional vertigo) 02/22/2014  . HTN (hypertension) 02/22/2014    Past Surgical History:  Procedure Laterality Date  . CALCANEAL OSTEOTOMY Right 12/23/2012   Procedure: RIGHT CALCANEAL OSTEOTOMY, RIGHT LATERAL COLUMN LENGTHENING, RIGHT COTTON OSTEOTOMY, RIGHT FLEX DIGITORIUM LONGUS TRANSFER, RIGHT KIDNER PROCEDURE ;  Surgeon: Toni Arthurs, MD;  Location: [facility];  Service: Orthopedics;  Laterality: Right;  . CESAREAN SECTION  07/13/2005; 06/03/2006  . DILATION AND EVACUATION  08/16/2008  . FOOT SURGERY Left 2002  . GASTROCNEMIUS RECESSION Right 12/23/2012   Procedure: RIGHT  GASTRO RECESSION ;  Surgeon: Toni Arthurs, MD;  Location: [facility];  Service: Orthopedics;  Laterality: Right;  . HARDWARE REMOVAL Right 03/24/2013   Procedure: HARDWARE REMOVAL RIGHT HEEL;  Surgeon: Toni Arthurs, MD;  Location: [facility];  Service: Orthopedics;  Laterality: Right;  . WISDOM TOOTH EXTRACTION       Home Medications    Prior to Admission medications   Medication Sig Start Date End Date Taking? Authorizing Provider  diclofenac (VOLTAREN) 75 MG EC tablet TAKE 1 TABLET BY MOUTH 2 TIMES DAILY. 01/04/18  Yes Sheliah Hatch, MD  diclofenac sodium (VOLTAREN) 1 % GEL Apply 4 g topically 4 (four) times daily. 11/17/17  Yes Sheliah Hatch, MD  losartan-hydrochlorothiazide (HYZAAR) 100-25 MG tablet Take 1 tablet by mouth daily. 06/11/17  Yes Kriste Basque R, DO  albuterol (PROVENTIL HFA;VENTOLIN HFA) 108 (90 Base) MCG/ACT inhaler Inhale 2 puffs into the lungs every 6 (six) hours as needed for wheezing or shortness of breath. Patient not taking: Reported on 10/19/2017 06/25/17   Sheliah Hatch, MD  beclomethasone (QVAR REDIHALER) 80 MCG/ACT inhaler Inhale 1 puff into the lungs 2 (two) times daily. 08/07/17   Sheliah Hatch, MD  cloNIDine (CATAPRES) 0.1 MG tablet Take 1 tablet (0.1 mg total) by mouth 2 (two) times daily as needed. For increased leg swelling and bp > 160/110 01/18/18   Isa Rankin, MD    Family History Family  History  Problem Relation Age of Onset  . Hypertension Mother   . Hypertension Father   . Diabetes Father     Social History Social History   Tobacco Use  . Smoking status: Never Smoker  . Smokeless tobacco: Never Used  Substance Use Topics  . Alcohol use: No  . Drug use: No     Allergies   Augmentin [amoxicillin-pot clavulanate]; Biotin; and Sulfa antibiotics   Review of Systems Review of Systems  All other systems reviewed and are negative.    Physical Exam Triage Vital Signs ED Triage  Vitals  Enc Vitals Group     BP 01/18/18 1915 (!) 169/116     Pulse Rate 01/18/18 1915 73     Resp 01/18/18 1915 16     Temp 01/18/18 1915 98.7 F (37.1 C)     Temp Source 01/18/18 1915 Oral     SpO2 01/18/18 1915 100 %     Weight 01/18/18 1912 168 lb (76.2 kg)     Height --      Pain Score 01/18/18 1912 0     Pain Loc --    Updated Vital Signs BP (!) 143/96   Pulse 73   Temp 98.7 F (37.1 C) (Oral)   Resp 16   Wt 168 lb (76.2 kg)   LMP 01/11/2018   SpO2 100%   BMI 27.96 kg/m  Physical Exam  Constitutional: She is oriented to person, place, and time. No distress.  HENT:  Head: Atraumatic.  Eyes:  Conjugate gaze observed, no eye redness/discharge  Neck: Neck supple.  Cardiovascular: Normal rate and regular rhythm.  Pulmonary/Chest: No respiratory distress. She has no wheezes. She has no rales.  Lungs clear, symmetric breath sounds   Abdominal: She exhibits no distension.  Musculoskeletal: Normal range of motion.  Symmetric pitting edema 1+ and about halfway up both shins.  No warmth, no redness.  Neurological: She is alert and oriented to person, place, and time.  Skin: Skin is warm and dry.  Nursing note and vitals reviewed.    UC Treatments / Results  Labs Results for orders placed or performed during the hospital encounter of 01/18/18  CBC with Differential  Result Value Ref Range   WBC 4.9 4.0 - 10.5 K/uL   RBC 3.95 3.87 - 5.11 MIL/uL   Hemoglobin 11.7 (L) 12.0 - 15.0 g/dL   HCT 40.9 (L) 81.1 - 91.4 %   MCV 89.4 78.0 - 100.0 fL   MCH 29.6 26.0 - 34.0 pg   MCHC 33.1 30.0 - 36.0 g/dL   RDW 78.2 95.6 - 21.3 %   Platelets 316 150 - 400 K/uL   Neutrophils Relative % 55 %   Neutro Abs 2.7 1.7 - 7.7 K/uL   Lymphocytes Relative 31 %   Lymphs Abs 1.5 0.7 - 4.0 K/uL   Monocytes Relative 9 %   Monocytes Absolute 0.4 0.1 - 1.0 K/uL   Eosinophils Relative 3 %   Eosinophils Absolute 0.2 0.0 - 0.7 K/uL   Basophils Relative 2 %   Basophils Absolute 0.1 0.0 - 0.1  K/uL   Immature Granulocytes 0 %   Abs Immature Granulocytes 0.0 0.0 - 0.1 K/uL  Comprehensive metabolic panel  Result Value Ref Range   Sodium 140 135 - 145 mmol/L   Potassium 3.5 3.5 - 5.1 mmol/L   Chloride 105 101 - 111 mmol/L   CO2 27 22 - 32 mmol/L   Glucose, Bld 92 65 - 99 mg/dL  BUN <5 (L) 6 - 20 mg/dL   Creatinine, Ser 8.11 0.44 - 1.00 mg/dL   Calcium 8.6 (L) 8.9 - 10.3 mg/dL   Total Protein 6.5 6.5 - 8.1 g/dL   Albumin 3.6 3.5 - 5.0 g/dL   AST 20 15 - 41 U/L   ALT 32 14 - 54 U/L   Alkaline Phosphatase 45 38 - 126 U/L   Total Bilirubin 0.4 0.3 - 1.2 mg/dL   GFR calc non Af Amer >60 >60 mL/min   GFR calc Af Amer >60 >60 mL/min   Anion gap 8 5 - 15    EKG None  Radiology No results found.  Procedures Procedures (including critical care time)  Medications Ordered in UC Medications  cloNIDine (CATAPRES) tablet 0.1 mg (0.1 mg Oral Given 01/18/18 1926)  improvement in bp to 143/96 after clonidine dose.   Final Clinical Impressions(s) / UC Diagnoses   Final diagnoses:  Elevated blood pressure reading  Leg swelling     Discharge Instructions     Labs including blood count and chemistries/liver/kidney tests are pending.  The urgent care will contact you if these need urgent reassessment.  Clonidine 0.1 mg x 1 dose was given at the urgent care tonight with good improvement in blood pressure.  A prescription for this was sent to the pharmacy, to take if leg swelling increases and bp >160/110.  Followup with Dr Beverely Low to discuss next steps.     Late note: Labs nondiagnositic (CBC, CMP), consider possibility of contribution to leg swelling/fluid retention from NSAIDs? (diclofenac rx dated 01/04/18--) vs occult increase in dietary sodium/fluids due to warm weather vs fluid retention 2ndary to hypertension/diastolic dysfunction.  ED Prescriptions    Medication Sig Dispense Auth. Provider   cloNIDine (CATAPRES) 0.1 MG tablet Take 1 tablet (0.1 mg total) by mouth 2 (two)  times daily as needed. For increased leg swelling and bp > 160/110 30 tablet Isa Rankin, MD        Isa Rankin, MD 01/19/18 2027

## 2018-02-03 ENCOUNTER — Encounter: Payer: Self-pay | Admitting: Family Medicine

## 2018-02-04 ENCOUNTER — Encounter: Payer: Self-pay | Admitting: Family Medicine

## 2018-02-04 ENCOUNTER — Other Ambulatory Visit: Payer: Self-pay

## 2018-02-04 ENCOUNTER — Ambulatory Visit (INDEPENDENT_AMBULATORY_CARE_PROVIDER_SITE_OTHER): Payer: No Typology Code available for payment source | Admitting: Family Medicine

## 2018-02-04 VITALS — BP 118/80 | HR 65 | Temp 98.8°F | Resp 16 | Ht 65.0 in | Wt 173.4 lb

## 2018-02-04 DIAGNOSIS — R519 Headache, unspecified: Secondary | ICD-10-CM

## 2018-02-04 DIAGNOSIS — M542 Cervicalgia: Secondary | ICD-10-CM

## 2018-02-04 DIAGNOSIS — R51 Headache: Secondary | ICD-10-CM

## 2018-02-04 MED ORDER — CYCLOBENZAPRINE HCL 10 MG PO TABS: 10.0000 mg | ORAL_TABLET | Freq: Every evening | ORAL | 0 refills | Status: DC | PRN

## 2018-02-04 MED ORDER — CETIRIZINE HCL 10 MG PO TABS: 10.0000 mg | ORAL_TABLET | Freq: Every day | ORAL | 11 refills | Status: DC

## 2018-02-04 MED ORDER — KETOROLAC TROMETHAMINE 60 MG/2ML IM SOLN
60.0000 mg | Freq: Once | INTRAMUSCULAR | Status: AC
  Administered 2018-02-04: 60 mg via INTRAMUSCULAR

## 2018-02-04 MED FILL — CYCLOBENZAPRINE 10 MG TAB: 10 | 30 days supply | Qty: 30 | Fill #0

## 2018-02-04 MED FILL — CETIRIZINE HCL 10 MG TABLET: 10 | 90 days supply | Qty: 90 | Fill #0

## 2018-02-04 NOTE — Progress Notes (Signed)
   Subjective:    Patient ID: [name], female    DOB: 08/29/1976, 42 y.o.   MRN: [id number]  HPI HA- pt reports HA and neck pain starting ~4 days ago.  No changes in sleeping habits, no new pillows.  No new activities.  Pain radiates from occipital protuberance down into neck.  HA is frontal.  No N/V.  No sensitivity to light or sound.  Some blurry vision.  Will wake w/ HA and it will remain constant throughout the day.  Has tried Sudafed, Goody's, Diclofenac, turmeric w/o success.   Review of Systems For ROS see HPI     Objective:   Physical Exam  Constitutional: She is oriented to person, place, and time. She appears well-developed and well-nourished. No distress.  HENT:  Head: Normocephalic and atraumatic.  Right Ear: Tympanic membrane normal.  Left Ear: Tympanic membrane normal.  Nose: Mucosal edema and rhinorrhea present. Right sinus exhibits no maxillary sinus tenderness and no frontal sinus tenderness. Left sinus exhibits no maxillary sinus tenderness and no frontal sinus tenderness.  Mouth/Throat: Mucous membranes are normal. Posterior oropharyngeal erythema (w/ PND) present.  Eyes: Pupils are equal, round, and reactive to light. Conjunctivae and EOM are normal.  Neck: Normal range of motion. Neck supple.  TTP over upper traps bilaterally  Cardiovascular: Normal rate, regular rhythm and normal heart sounds.  Pulmonary/Chest: Effort normal and breath sounds normal. No respiratory distress. She has no wheezes. She has no rales.  Lymphadenopathy:    She has no cervical adenopathy.  Neurological: She is alert and oriented to person, place, and time.  Vitals reviewed.         Assessment & Plan:  HAs- new.  Pt has frontal HAs that could be multifactorial- tension HA from neck spasm, sinus HA, migraine variant.  As pt has HA today in office, Toradol injxn given.  Start Flexeril nightly.  Add daily antihistamine.  Reviewed supportive care and red flags that should prompt  return.  Pt expressed understanding and is in agreement w/ plan.   Neck pain- new.  Pt's pain is consistent w/ trap spasm/tenderness.  This could be causing tension headache for pt.  Start Flexeril nightly as needed.  Heat.  Toradol injxn given today in office.  Reviewed supportive care and red flags that should prompt return.  Pt expressed understanding and is in agreement w/ plan.

## 2018-02-04 NOTE — Patient Instructions (Signed)
Follow up by phone or MyChart if no improvement USE the Flexeril nightly for spasm- can also use during the day if not working HEAT! START the Zyrtec (Cetirizine) daily to improve allergy congestion Assess your pillows and determine if it's time for new ones! Drink plenty of fluids- dehydration worsens headaches Call with any questions or concerns Hang in there!!!

## 2018-02-26 LAB — HM PAP SMEAR

## 2018-03-08 ENCOUNTER — Encounter: Payer: Self-pay | Admitting: Family Medicine

## 2018-03-16 ENCOUNTER — Encounter: Payer: Self-pay | Admitting: Gastroenterology

## 2018-03-19 MED FILL — ESCITALOPRAM 10 MG TABLET: 10 | 30 days supply | Qty: 30 | Fill #0

## 2018-03-26 ENCOUNTER — Encounter: Payer: Self-pay | Admitting: Family Medicine

## 2018-03-26 ENCOUNTER — Ambulatory Visit: Payer: No Typology Code available for payment source | Admitting: Sports Medicine

## 2018-03-26 DIAGNOSIS — M542 Cervicalgia: Secondary | ICD-10-CM

## 2018-03-26 NOTE — Telephone Encounter (Signed)
Patient has been scheduled with Dr. Berline Choughigby on 04/01/18 at 9:20.  Pt has been notified of appt.

## 2018-03-29 ENCOUNTER — Encounter: Payer: Self-pay | Admitting: Family Medicine

## 2018-03-31 ENCOUNTER — Encounter: Payer: Self-pay | Admitting: Physician Assistant

## 2018-03-31 ENCOUNTER — Ambulatory Visit (INDEPENDENT_AMBULATORY_CARE_PROVIDER_SITE_OTHER): Payer: No Typology Code available for payment source | Admitting: Physician Assistant

## 2018-03-31 ENCOUNTER — Other Ambulatory Visit: Payer: Self-pay

## 2018-03-31 VITALS — BP 130/78 | HR 70 | Temp 99.1°F | Resp 16 | Ht 65.0 in | Wt 174.5 lb

## 2018-03-31 DIAGNOSIS — L600 Ingrowing nail: Secondary | ICD-10-CM

## 2018-03-31 DIAGNOSIS — B351 Tinea unguium: Secondary | ICD-10-CM

## 2018-03-31 NOTE — Progress Notes (Signed)
Patient presents to clinic today c/o pain in bilateral great toes with R > L. R toe pain has been present for 1 month. Is only present when pressure is applied to her nail. Most pain is around nail bed. Denies drainage. Denies fevers, chills. L toe last week or so. Denies history of [place] toenails.   Past Medical History:  Diagnosis Date  . Arthritis    right foot  . Asthma   . Congenital flat foot    has had bilateral foot surgery to correct  . Frequent headaches   . Hypertension   . Irritable bowel syndrome (IBS)    no current med.  . Painful orthopaedic hardware [facility]) 03/2013   right foot    Current Outpatient Medications on File Prior to Visit  Medication Sig Dispense Refill  . acetaminophen (TYLENOL) 500 MG tablet acetaminophen 500 mg tablet   1000 mg as needed by oral route.    . cetirizine (ZYRTEC) 10 MG tablet Take 1 tablet (10 mg total) by mouth daily. 30 tablet 11  . cloNIDine (CATAPRES) 0.1 MG tablet Take 1 tablet (0.1 mg total) by mouth 2 (two) times daily as needed. For increased leg swelling and bp > 160/110 30 tablet 0  . diclofenac (VOLTAREN) 75 MG EC tablet TAKE 1 TABLET BY MOUTH 2 TIMES DAILY. 30 tablet 0  . diclofenac sodium (VOLTAREN) 1 % GEL Apply 4 g topically 4 (four) times daily. 100 g 3  . Glucosamine-Chondroitin 1500-1200 MG/30ML LIQD Glucosamine Chondroitin    . losartan-hydrochlorothiazide (HYZAAR) 100-25 MG tablet Take 1 tablet by mouth daily. 90 tablet 3  . Probiotic Product (PROBIOTIC-10 PO) Take 1 capsule by mouth daily.    . Turmeric 1053 MG TABS Take 1 tablet by mouth 2 (two) times daily.    Marland Kitchen albuterol (PROVENTIL HFA;VENTOLIN HFA) 108 (90 Base) MCG/ACT inhaler Inhale 2 puffs into the lungs every 6 (six) hours as needed for wheezing or shortness of breath. (Patient not taking: Reported on 10/19/2017) 1 Inhaler 2   No current facility-administered medications on file prior to visit.     Allergies  Allergen Reactions  . Augmentin [Amoxicillin-Pot  Clavulanate] Nausea And Vomiting  . Biotin Other (See Comments)    Severe yeast infection  . Sulfa Antibiotics Other (See Comments)    Reaction:  Muscle pain     Family History  Problem Relation Age of Onset  . Hypertension Mother   . Hypertension Father   . Diabetes Father     Social History   Socioeconomic History  . Marital status: Married    Spouse name: Not on file  . Number of children: Not on file  . Years of education: Not on file  . Highest education level: Not on file  Occupational History  . Not on file  Social Needs  . Financial resource strain: Not on file  . Food insecurity:    Worry: Not on file    Inability: Not on file  . Transportation needs:    Medical: Not on file    Non-medical: Not on file  Tobacco Use  . Smoking status: Never Smoker  . Smokeless tobacco: Never Used  Substance and Sexual Activity  . Alcohol use: No  . Drug use: No  . Sexual activity: Yes    Birth control/protection: IUD  Lifestyle  . Physical activity:    Days per week: Not on file    Minutes per session: Not on file  . Stress: Not on file  Relationships  . Social connections:    Talks on phone: Not on file    Gets together: Not on file    Attends religious service: Not on file    Active member of club or organization: Not on file    Attends meetings of clubs or organizations: Not on file    Relationship status: Not on file  Other Topics Concern  . Not on file  Social History Narrative  . Not on file   Review of Systems - See HPI.  All other ROS are negative.  BP 130/78   Pulse 70   Temp 99.1 F (37.3 C) (Oral)   Resp 16   Ht '5\' 5"'  (1.651 m)   Wt 174 lb 8 oz (79.2 kg)   SpO2 98%   BMI 29.04 kg/m   Physical Exam  Constitutional: She appears well-developed and well-nourished.  HENT:  Head: Normocephalic and atraumatic.  Cardiovascular: Normal rate, regular rhythm, normal heart sounds and intact distal pulses.  Musculoskeletal:       Feet:  Vitals  reviewed.   Recent Results (from the past 2160 hour(s))  CBC with Differential     Status: Abnormal   Collection Time: 01/18/18  7:37 PM  Result Value Ref Range   WBC 4.9 4.0 - 10.5 K/uL   RBC 3.95 3.87 - 5.11 MIL/uL   Hemoglobin 11.7 (L) 12.0 - 15.0 g/dL   HCT 35.3 (L) 36.0 - 46.0 %   MCV 89.4 78.0 - 100.0 fL   MCH 29.6 26.0 - 34.0 pg   MCHC 33.1 30.0 - 36.0 g/dL   RDW 12.5 11.5 - 15.5 %   Platelets 316 150 - 400 K/uL   Neutrophils Relative % 55 %   Neutro Abs 2.7 1.7 - 7.7 K/uL   Lymphocytes Relative 31 %   Lymphs Abs 1.5 0.7 - 4.0 K/uL   Monocytes Relative 9 %   Monocytes Absolute 0.4 0.1 - 1.0 K/uL   Eosinophils Relative 3 %   Eosinophils Absolute 0.2 0.0 - 0.7 K/uL   Basophils Relative 2 %   Basophils Absolute 0.1 0.0 - 0.1 K/uL   Immature Granulocytes 0 %   Abs Immature Granulocytes 0.0 0.0 - 0.1 K/uL    Comment: Performed at [facility], [address] [place], [place] [address]  Comprehensive metabolic panel     Status: Abnormal   Collection Time: 01/18/18  7:37 PM  Result Value Ref Range   Sodium 140 135 - 145 mmol/L   Potassium 3.5 3.5 - 5.1 mmol/L   Chloride 105 101 - 111 mmol/L   CO2 27 22 - 32 mmol/L   Glucose, Bld 92 65 - 99 mg/dL   BUN <5 (L) 6 - 20 mg/dL   Creatinine, Ser 0.73 0.44 - 1.00 mg/dL   Calcium 8.6 (L) 8.9 - 10.3 mg/dL   Total Protein 6.5 6.5 - 8.1 g/dL   Albumin 3.6 3.5 - 5.0 g/dL   AST 20 15 - 41 U/L   ALT 32 14 - 54 U/L   Alkaline Phosphatase 45 38 - 126 U/L   Total Bilirubin 0.4 0.3 - 1.2 mg/dL   GFR calc non Af Amer >60 >60 mL/min   GFR calc Af Amer >60 >60 mL/min    Comment: (NOTE) The eGFR has been calculated using the CKD EPI equation. This calculation has not been validated in all clinical situations. eGFR's persistently <60 mL/min signify possible Chronic Kidney Disease.    Anion gap 8 5 -  15    Comment: Performed at [facility], [place] [address] [place], [place] [address]  HM PAP SMEAR     Status: None    Collection Time: 02/26/18 12:00 AM  Result Value Ref Range   HM Pap smear completed     Assessment/Plan: 1. Ingrown toenail Bilateral great toes with R>L. Supportive measures reviewed with patient. Start soaks. Referral to Podiatry placed. - Ambulatory referral to Podiatry  2. Fungal nail infection Atypical. Referral to podiatry placed for further management. - Ambulatory referral to Lilly, PA-C

## 2018-03-31 NOTE — Patient Instructions (Signed)
Please do the peroxide soaks as directed.  Pat dry. Wear wide-toed shoes when possible.  I am setting you up with Podiatry for further management. You should be contacted by them within 1 week.

## 2018-04-01 ENCOUNTER — Encounter: Payer: Self-pay | Admitting: Sports Medicine

## 2018-04-01 ENCOUNTER — Ambulatory Visit: Payer: No Typology Code available for payment source | Admitting: Sports Medicine

## 2018-04-01 VITALS — BP 152/100 | HR 80 | Ht 65.0 in | Wt 176.8 lb

## 2018-04-01 DIAGNOSIS — M542 Cervicalgia: Secondary | ICD-10-CM | POA: Diagnosis not present

## 2018-04-01 DIAGNOSIS — M9908 Segmental and somatic dysfunction of rib cage: Secondary | ICD-10-CM

## 2018-04-01 DIAGNOSIS — M9902 Segmental and somatic dysfunction of thoracic region: Secondary | ICD-10-CM

## 2018-04-01 DIAGNOSIS — M9901 Segmental and somatic dysfunction of cervical region: Secondary | ICD-10-CM

## 2018-04-01 MED ORDER — CYCLOBENZAPRINE HCL 10 MG PO TABS: 10.0000 mg | ORAL_TABLET | Freq: Three times a day (TID) | ORAL | 1 refills | Status: DC | PRN

## 2018-04-01 MED FILL — CYCLOBENZAPRINE HCL 10 MG T: 10 | 10 days supply | Qty: 30 | Fill #0

## 2018-04-01 NOTE — Progress Notes (Signed)
PROCEDURE NOTE: THERAPEUTIC EXERCISES (97110) 15 minutes spent for Therapeutic exercises as below and as referenced in the AVS.  This included exercises focusing on stretching, strengthening, with significant focus on eccentric aspects.   Proper technique shown and discussed handout in great detail with ATC.  All questions were discussed and answered.   Long term goals include an improvement in range of motion, strength, endurance as well as avoiding reinjury. Frequency of visits is one time as determined during today's  office visit. Frequency of exercises to be performed is as per handout.  EXERCISES REVIEWED:  [name] Exercises  Towel stretching

## 2018-04-01 NOTE — Progress Notes (Signed)
Veverly FellsMichael D. Delorise Shinerigby, DO  [facility] [phone]  Tammy Alexandriaamika Prigmore - 42 y.o. female MRN [id number]  Date of birth: October 28, 1975  Visit Date: 04/01/2018  PCP: Sheliah Hatchabori, Katherine E, MD   Referred by: Sheliah Hatchabori, Katherine E, MD  Scribe(s) for today's visit: Stevenson ClinchBrandy Coleman, CMA  SUBJECTIVE:  [name] is here for Initial Assessment (neck pain) .  Referred by: Dr. Neena RhymesKatherine Tabori  Her neck pain symptoms INITIALLY: Began about 2 months ago and MOI is unknown. She denies past injury to the neck.  Described as moderate aching/pinching, radiating to both shoulder (L>R).  Worsened with movement, side-to-side, looking up and down.  Improved with rest. Additional associated symptoms include: She reports HA d/t neck pain. She denies visual changes. She feels that ROM in her neck has decreased d/t pain.     At this time symptoms are worsening compared to onset, more frequent and more severe. Pain wakes her at night when she rolls to her side.  She was taking Flexeril which helped at night but it didn't seem to help during the day. She has been sleeping on a heating pas nightly with some relief. She takes Diclofenac for arthritis. She takes Tylenol 1000 mg with minimal relief. She also takes Glucosamine and Turmeric. She takes Goody's powder for her HA. She has also tried using [employer] Balm with minimal relief.  She received Toradol inj 02/04/18 and got short term relief.   No recent XR of neck.    REVIEW OF SYSTEMS: Reports night time disturbances. Denies fevers, chills, or night sweats. Denies unexplained weight loss. Denies personal history of cancer. Reports changes in bowel - blood in stool - referred to GI. Denies recent unreported falls. Denies new or worsening dyspnea or wheezing. Reports headaches.  Reports numbness, tingling or weakness  In the extremities.  Denies dizziness or presyncopal episodes Reports lower extremity edema - ankles,  takes Clonidine.    HISTORY & PERTINENT PRIOR DATA:  Prior History reviewed and updated per electronic medical record.  Significant/pertinent history, findings, studies include:  reports that she has never smoked. She has never used smokeless tobacco. No results for input(s): HGBA1C, LABURIC, CREATINE in the last 8760 hours. No specialty comments available. No problems updated.  OBJECTIVE:  VS:  HT:5\' 5"  (165.1 cm)   WT:176 lb 12.8 oz (80.2 kg)  BMI:29.42    BP:(Abnormal) 152/100  HR:80bpm  TEMP: ( )  RESP:99 %   PHYSICAL EXAM: Constitutional: WDWN, Non-toxic appearing. Psychiatric: Alert & appropriately interactive.  Not depressed or anxious appearing. Respiratory: No increased work of breathing.  Trachea Midline Eyes: Pupils are equal.  EOM intact without nystagmus.  No scleral icterus  Vascular Exam: warm to touch no edema  upper extremity neuro exam: unremarkable normal strength normal sensation normal reflexes  MSK Exam: Spurling's compression test is normal without radicular symptoms but does cause some pain localized to the left cervicothoracic junction.  Negative Lhermitte's compression test.  Cervical rotation to the left is slightly restricted.  Upper extremity strength reflexes and sensation are otherwise normal.  Negative Hoffmann's.    ASSESSMENT & PLAN:   1. Somatic dysfunction of cervical region   2. Musculoskeletal neck pain   3. Somatic dysfunction of thoracic region   4. Somatic dysfunction of rib cage region     PLAN: Discussed the foundation of treatment for this condition is physical therapy and/or daily (5-6 days/week) therapeutic exercises, focusing on core strengthening, coordination, neuromuscular control/reeducation.  Therapeutic  exercises prescribed per procedure note.  Osteopathic manipulation was performed today based on physical exam findings.  Please see procedure note for further information including Osteopathic Exam  findings  Symptoms are consistent musculoskeletal neck pain.  We will have her refill the Flexeril to take at night given this is been helpful.  Links to [employer] provided today per Patient Instructions.  These exercises were developed by Myles Lipps, DC with a strong emphasis on core neuromuscular reducation and postural realignment through body-weight exercises.  Follow-up: Return in about 2 weeks (around 04/15/2018) for consideration of repeat osteopathic manipulation.      Please see additional documentation for Objective, Assessment and Plan sections. Pertinent additional documentation may be included in corresponding procedure notes, imaging studies, problem based documentation and patient instructions. Please see these sections of the encounter for additional information regarding this visit.  CMA/ATC served as Neurosurgeon during this visit. History, Physical, and Plan performed by medical provider. Documentation and orders reviewed and attested to.      Andrena Mews, DO    [facility] Physician

## 2018-04-01 NOTE — Progress Notes (Signed)
PROCEDURE NOTE : OSTEOPATHIC MANIPULATION The decision today to treat with Osteopathic Manipulative Therapy (OMT) was based on physical exam findings. Verbal consent was obtained following a discussion with the patient regarding the of risks, benefits and potential side effects, including an acute pain flare,post manipulation soreness and need for repeat treatments. Additionally, we specifically discussed the minimal risk of  injury to neurovascular structures associated with Cervical manipulation.   Contraindications to OMT: NONE  Manipulation was performed as below: Regions treated: Cervical spine, Ribs and Thoracic spine OMT Techniques Used: HVLA, muscle energy and myofascial release  The patient tolerated the treatment well and reported Improved symptoms following treatment today. Patient was given medications, exercises, stretches and lifestyle modifications per AVS and verbally.   OSTEOPATHIC/STRUCTURAL EXAM:   OA rotated right C2 through C4 FRS left C6 FRS left T2 - T5 neutral side bent right, rotated left Ribs 6 posterior right

## 2018-04-01 NOTE — Patient Instructions (Signed)
Please perform the exercise program that we have prepared for you and gone over in detail on a daily basis.  In addition to the handout you were provided you can access your program through: [email or web address]   Your unique program code is: [email or web address]    Also check out [employer]"Foundation Training" which is a program developed by Dr. Myles LippsEric Goodman.   There are links to a couple of his YouTube Videos below and I would like to see performing one of his videos 5-6 days per week.    A good intro video is: "Independence from Pain 7-minute Video" - [email or web address]   Exercises that focus more on the neck are as below: Dr. Derrill KayGoodman with [employer] [facility] [employer] teaching neck and shoulder details Part 1 - https://[email or web address] Part 2 Dr. Derrill KayGoodman with [facility] quick routine to practice daily - https://[email or web address].[email or web address]  Do not try to attempt the entire video when first beginning.    Try breaking of each exercise that he goes into shorter segments.  Otherwise if they perform an exercise for 45 seconds, start with 15 seconds and rest and then resume when they begin the new activity.  If you work your way up to being able to do these videos without having to stop, I expect you will see significant improvements in your pain.  If you enjoy his videos and would like to find out more you can look on his website: [email or web address].  He has a workout streaming option as well as a DVD set available for purchase.  Amazon has the best price for his DVDs.

## 2018-04-05 ENCOUNTER — Encounter

## 2018-04-05 ENCOUNTER — Encounter: Payer: Self-pay | Admitting: Gastroenterology

## 2018-04-05 ENCOUNTER — Ambulatory Visit: Payer: No Typology Code available for payment source | Admitting: Gastroenterology

## 2018-04-05 VITALS — BP 134/90 | HR 80 | Ht 64.5 in | Wt 178.5 lb

## 2018-04-05 DIAGNOSIS — K529 Noninfective gastroenteritis and colitis, unspecified: Secondary | ICD-10-CM

## 2018-04-05 DIAGNOSIS — R195 Other fecal abnormalities: Secondary | ICD-10-CM

## 2018-04-05 DIAGNOSIS — R1030 Lower abdominal pain, unspecified: Secondary | ICD-10-CM | POA: Diagnosis not present

## 2018-04-05 DIAGNOSIS — R198 Other specified symptoms and signs involving the digestive system and abdomen: Secondary | ICD-10-CM | POA: Diagnosis not present

## 2018-04-05 MED ORDER — HYOSCYAMINE SULFATE 0.125 MG SL SUBL: 0.1250 mg | SUBLINGUAL_TABLET | Freq: Four times a day (QID) | SUBLINGUAL | 1 refills | Status: DC | PRN

## 2018-04-05 MED ORDER — PEG-KCL-NACL-NASULF-NA ASC-C 140 G PO SOLR: 140.0000 g | ORAL | 0 refills | Status: DC

## 2018-04-05 NOTE — Patient Instructions (Addendum)
If you are age 43 or older, your body mass index should be between 23-30. Your Body mass index is 30.17 kg/m. If this is out of the aforementioned range listed, please consider follow up with your Primary Care Provider.  If you are age 70 or younger, your body mass index should be between 19-25. Your Body mass index is 30.17 kg/m. If this is out of the aformentioned range listed, please consider follow up with your Primary Care Provider.   You have been scheduled for a colonoscopy. Please follow written instructions given to you at your visit today.  Please pick up your prep supplies at the pharmacy within the next 1-3 days. If you use inhalers (even only as needed), please bring them with you on the day of your procedure. Your physician has requested that you go to www.[email or web address].com and enter the access code given to you at your visit today. This web site gives a general overview about your procedure. However, you should still follow specific instructions given to you by our office regarding your preparation for the procedure.  Your provider has requested that you go to the basement level for lab work before leaving today. Press "B" on the elevator. The lab is located at the first door on the left as you exit the elevator.   Food Guidelines for a sensitive stomach  Many people have difficulty digesting certain foods, causing a variety of distressing and embarrassing symptoms such as abdominal pain, bloating and gas.  These foods may need to be avoided or consumed in small amounts.  Here are some tips that might be helpful for you.  1.   Lactose intolerance is the difficulty or complete inability to digest lactose, the natural sugar in milk and anything made from milk.  This condition is harmless, common, and can begin any time during life.  Some people can digest a modest amount of lactose while others cannot tolerate any.  Also, not all dairy products contain equal amounts of lactose.  For  example, hard cheeses such as parmesan have less lactose than soft cheeses such as cheddar.  Yogurt has less lactose than milk or cheese.  Many packaged foods (even many brands of bread) have milk, so read ingredient lists carefully.  It is difficult to test for lactose intolerance, so just try avoiding lactose as much as possible for a week and see what happens with your symptoms.  If you seem to be lactose intolerant, the best plan is to avoid it (but make sure you get calcium from another source).  The next best thing is to use lactase enzyme supplements, available over the counter everywhere.  Just know that many lactose intolerant people need to take several tablets with each serving of dairy to avoid symptoms.  Lastly, a lot of restaurant food is made with milk or butter.  Many are things you might not suspect, such as mashed potatoes, rice and pasta (cooked with butter) and "grilled" items.  If you are lactose intolerant, it never hurts to ask your server what has milk or butter.  2.   Fiber is an important part of your diet, but not all fiber is well-tolerated.  Insoluble fiber such as bran is often consumed by normal gut bacteria and converted into gas.  Soluble fiber such as oats, squash, carrots and green beans are typically tolerated better.  3.   Some types of carbohydrates can be poorly digested.  Examples include: fructose (apples, cherries, pears, raisins and other dried  fruits), fructans (onions, zucchini, large amounts of wheat), sorbitol/mannitol/xylitol and sucralose/Splenda (common artificial sweeteners), and raffinose (lentils, broccoli, cabbage, asparagus, brussel sprouts, many types of beans).  Do a Programmer, multimediaweb search for National CityFODMAP diet and you will find helpful information. Beano, a dietary supplement, will often help with raffinose-containing foods.  As with lactase tablets, you may need several per serving.  4.   Whenever possible, avoid processed food&meats and chemical additives.  High  fructose corn syrup, a common sweetener, may be difficult to digest.  Eggs and soy (comes from the soybean, and added to many foods now) are other common bloating/gassy foods.  - Dr. Sherlynn CarbonHenry Danis [facility]

## 2018-04-05 NOTE — Progress Notes (Signed)
[facility] Consult Note:  History: [name] 04/05/2018  Referring physician: Marcelle Overlie, MD ([facility])  Reason for consult/chief complaint: heme positive stool test; Diarrhea (and sometimes explosive); and fecal urgency (if don't go then have feeling of near syncope)   Subjective  HPI:  This is a very pleasant 42 year old woman referred by the gynecology practice for routine stool test positive for occult blood.  This was done at a annual visit.  She reports years of crampy lower abdominal pain with urgency and soft to loose stool, worse with stress.  She reports a previous primary care evaluation that labeled with IBS.  She has not previously seen a GI physician nor had any medicines for this.  She denies grossly bloody stool.  She denies facial swelling, rash or swollen joints with the symptoms.  She takes diclofenac occasionally, but less so recently after an injection in the left foot.  She will also take Goody powders regularly.[name] denies heartburn, dysphagia, nausea, vomiting, early satiety or weight loss.   ROS:  Review of Systems  Constitutional: Negative for appetite change and unexpected weight change.  HENT: Negative for mouth sores and voice change.   Eyes: Negative for pain and redness.  Respiratory: Negative for cough and shortness of breath.   Cardiovascular: Negative for chest pain and palpitations.  Genitourinary: Negative for dysuria and hematuria.  Musculoskeletal: Positive for arthralgias and back pain. Negative for myalgias.  Skin: Negative for pallor and rash.  Neurological: Positive for headaches. Negative for weakness.  Hematological: Negative for adenopathy.     Past Medical History: Past Medical History:  Diagnosis Date  . Arthritis    right foot  . Asthma   . Congenital flat foot    has had bilateral foot surgery to correct  . Frequent headaches   . GERD (gastroesophageal reflux disease)   .  Hypertension   . Irritable bowel syndrome (IBS)    no current med.  . Painful orthopaedic hardware [facility]) 03/2013   right foot     Past Surgical History: Past Surgical History:  Procedure Laterality Date  . CALCANEAL OSTEOTOMY Right 12/23/2012   Procedure: RIGHT CALCANEAL OSTEOTOMY, RIGHT LATERAL COLUMN LENGTHENING, RIGHT COTTON OSTEOTOMY, RIGHT FLEX DIGITORIUM LONGUS TRANSFER, RIGHT KIDNER PROCEDURE ;  Surgeon: Toni Arthurs, MD;  Location: [facility];  Service: Orthopedics;  Laterality: Right;  . CESAREAN SECTION  07/13/2005; 06/03/2006  . DILATION AND EVACUATION  08/16/2008  . FOOT SURGERY Left 2002  . FOOT SURGERY Right   . GASTROCNEMIUS RECESSION Right 12/23/2012   Procedure: RIGHT GASTRO RECESSION ;  Surgeon: Toni Arthurs, MD;  Location: [facility];  Service: Orthopedics;  Laterality: Right;  . HARDWARE REMOVAL Right 03/24/2013   Procedure: HARDWARE REMOVAL RIGHT HEEL;  Surgeon: Toni Arthurs, MD;  Location: [facility];  Service: Orthopedics;  Laterality: Right;  . INTRAUTERINE DEVICE INSERTION    . WISDOM TOOTH EXTRACTION       Family History: Family History  Problem Relation Age of Onset  . Hypertension Mother   . Hypertension Father   . Diabetes Father   . Irritable bowel syndrome Father   . Breast cancer Paternal Grandmother   . Colon cancer Cousin        maternal  No history of Colon cancer in parents or siblings.  Social History: Social History   Socioeconomic History  . Marital status: Married    Spouse name: Not on file  . Number of children: 3  .  Years of education: Not on file  . Highest education level: Not on file  Occupational History  . Occupation: [employer] fundraiser[employer]  Social Needs  . Financial resource strain: Not on file  . Food insecurity:    Worry: Not on file    Inability: Not on file  . Transportation needs:    Medical: Not on file    Non-medical: Not on file  Tobacco Use  . Smoking status: Never Smoker  . Smokeless  tobacco: Never Used  Substance and Sexual Activity  . Alcohol use: No  . Drug use: No  . Sexual activity: Yes    Birth control/protection: IUD  Lifestyle  . Physical activity:    Days per week: Not on file    Minutes per session: Not on file  . Stress: Not on file  Relationships  . Social connections:    Talks on phone: Not on file    Gets together: Not on file    Attends religious service: Not on file    Active member of club or organization: Not on file    Attends meetings of clubs or organizations: Not on file    Relationship status: Not on file  Other Topics Concern  . Not on file  Social History Narrative  . 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 Augmentin [Amoxicillin-Pot Clavulanate] Nausea And Vomiting  . Biotin Other (See Comments)    Severe yeast infection  . Sulfa Antibiotics Other (See Comments)    Reaction:  Muscle pain     Outpatient Meds: Current Outpatient Medications  Medication Sig Dispense Refill  . acetaminophen (TYLENOL) 500 MG tablet acetaminophen 500 mg tablet   1000 mg as needed by oral route.    Marland Kitchen. albuterol (PROVENTIL HFA;VENTOLIN HFA) 108 (90 Base) MCG/ACT inhaler Inhale 2 puffs into the lungs every 6 (six) hours as needed for wheezing or shortness of breath. 1 Inhaler 2  . cetirizine (ZYRTEC) 10 MG tablet Take 1 tablet (10 mg total) by mouth daily. 30 tablet 11  . cloNIDine (CATAPRES) 0.1 MG tablet Take 1 tablet (0.1 mg total) by mouth 2 (two) times daily as needed. For increased leg swelling and bp > 160/110 30 tablet 0  . cyclobenzaprine (FLEXERIL) 10 MG tablet Take 1 tablet (10 mg total) by mouth 3 (three) times daily as needed for muscle spasms. 30 tablet 1  . diclofenac (VOLTAREN) 75 MG EC tablet TAKE 1 TABLET BY MOUTH 2 TIMES DAILY. 30 tablet 0  . diclofenac sodium (VOLTAREN) 1 % GEL Apply 4 g topically 4 (four) times daily. 100 g 3  . Glucosamine-Chondroitin  1500-1200 MG/30ML LIQD Glucosamine Chondroitin    . losartan-hydrochlorothiazide (HYZAAR) 100-25 MG tablet Take 1 tablet by mouth daily. 90 tablet 3  . Probiotic Product (PROBIOTIC-10 PO) Take 1 capsule by mouth daily.    . Turmeric 1053 MG TABS Take 1 tablet by mouth 2 (two) times daily.    . hyoscyamine (LEVSIN SL) 0.125 MG SL tablet Place 1 tablet (0.125 mg total) under the tongue every 6 (six) hours as needed. 45 tablet 1   No current facility-administered medications for this visit.       ___________________________________________________________________ Objective   Exam:  BP 134/90 (BP Location: Left Arm, Patient Position: Sitting, Cuff Size: Normal)   Pulse 80   Ht 5' 4.5" (1.638 m) Comment: height measured without shoes  Wt 178 lb 8 oz (81 kg)   LMP  03/22/2018   BMI 30.17 kg/m  Mother present for entire visit  General: this is a(n) well-appearing woman  Eyes: sclera anicteric, no redness  ENT: oral mucosa moist without lesions, no cervical or supraclavicular lymphadenopathy, good dentition  CV: RRR without murmur, S1/S2, no JVD, no peripheral edema  Resp: clear to auscultation bilaterally, normal RR and effort noted  GI: soft, no tenderness, with active bowel sounds. No guarding or palpable organomegaly noted.  Skin; warm and dry, no rash or jaundice noted  Neuro: awake, alert and oriented x 3. Normal gross motor function and fluent speech  Labs:  CBC Latest Ref Rng & Units 01/18/2018 07/31/2016 04/27/2016  WBC 4.0 - 10.5 K/uL 4.9 3.9(L) 4.4  Hemoglobin 12.0 - 15.0 g/dL 11.7(L) 13.7 13.1  Hematocrit 36.0 - 46.0 % 35.3(L) 41.3 36.1  Platelets 150 - 400 K/uL 316 337.0 343  MCV 89    Assessment: Encounter Diagnoses  Name Primary?  . Heme positive stool Yes  . Chronic diarrhea   . Tenesmus   . Lower abdominal pain     Incidental heme positive stool on routine gynecologic visit.  It was from a stool card rather than rectal exam. The patient has  long-standing symptoms that sound most like IBS, of unclear relation to the heme positive stool.  They are less likely IBD or celiac sprue.  Plan:  Written dietary advice given regarding the cramps and diarrhea, suspected IBS Trial of hyoscyamine TTG antibody and total IgA level Colonoscopy for heme positive stool.  She is agreeable after discussion of procedure and risks.  The benefits and risks of the planned procedure were described in detail with the patient or (when appropriate) their health care proxy.  Risks were outlined as including, but not limited to, bleeding, infection, perforation, adverse medication reaction leading to cardiac or pulmonary decompensation, or pancreatitis (if ERCP).  The limitation of incomplete mucosal visualization was also discussed.  No guarantees or warranties were given.   Thank you for the courtesy of this consult.  Please call me with any questions or concerns.  Charlie Pitter III  CC: Sheliah Hatch, MD Marcelle Overlie, MD

## 2018-04-14 ENCOUNTER — Ambulatory Visit: Payer: No Typology Code available for payment source | Admitting: Sports Medicine

## 2018-04-20 ENCOUNTER — Ambulatory Visit (INDEPENDENT_AMBULATORY_CARE_PROVIDER_SITE_OTHER): Payer: No Typology Code available for payment source

## 2018-04-20 ENCOUNTER — Ambulatory Visit: Payer: No Typology Code available for payment source | Admitting: Sports Medicine

## 2018-04-20 ENCOUNTER — Encounter: Payer: Self-pay | Admitting: Sports Medicine

## 2018-04-20 VITALS — BP 130/92 | HR 77 | Ht 64.5 in | Wt 180.0 lb

## 2018-04-20 DIAGNOSIS — M9908 Segmental and somatic dysfunction of rib cage: Secondary | ICD-10-CM | POA: Diagnosis not present

## 2018-04-20 DIAGNOSIS — M9901 Segmental and somatic dysfunction of cervical region: Secondary | ICD-10-CM | POA: Diagnosis not present

## 2018-04-20 DIAGNOSIS — M479 Spondylosis, unspecified: Secondary | ICD-10-CM

## 2018-04-20 DIAGNOSIS — M542 Cervicalgia: Secondary | ICD-10-CM

## 2018-04-20 DIAGNOSIS — M9902 Segmental and somatic dysfunction of thoracic region: Secondary | ICD-10-CM

## 2018-04-20 MED ORDER — CELECOXIB 200 MG PO CAPS: 200.0000 mg | ORAL_CAPSULE | Freq: Every day | ORAL | 2 refills | Status: DC

## 2018-04-20 MED FILL — CELECOXIB 200 MG CAP: 200 | 30 days supply | Qty: 30 | Fill #0

## 2018-04-20 MED FILL — LOSARTAN-HCTZ 100-25 MG TAB: 100-25 | 90 days supply | Qty: 90 | Fill #1

## 2018-04-20 NOTE — Progress Notes (Signed)
PROCEDURE NOTE: THERAPEUTIC EXERCISES (97110) 15 minutes spent for Therapeutic exercises as below and as referenced in the AVS.  This included exercises focusing on stretching, strengthening, with significant focus on eccentric aspects.   Proper technique shown and discussed handout in great detail with ATC.  All questions were discussed and answered.   Long term goals include an improvement in range of motion, strength, endurance as well as avoiding reinjury. Frequency of visits is one time as determined during today's  office visit. Frequency of exercises to be performed is as per handout.  EXERCISES REVIEWED:  Scapular Stabilization  Thoracic Mobility 

## 2018-04-20 NOTE — Progress Notes (Signed)
PROCEDURE NOTE : OSTEOPATHIC MANIPULATION The decision today to treat with Osteopathic Manipulative Therapy (OMT) was based on physical exam findings. Verbal consent was obtained following a discussion with the patient regarding the of risks, benefits and potential side effects, including an acute pain flare,post manipulation soreness and need for repeat treatments. Additionally, we specifically discussed the minimal risk of  injury to neurovascular structures associated with Cervical manipulation.   Contraindications to OMT: NONE  Manipulation was performed as below: Regions treated: Cervical spine, Ribs and Thoracic spine OMT Techniques Used: HVLA, muscle energy and myofascial release  The patient tolerated the treatment well and reported Improved symptoms following treatment today. Patient was given medications, exercises, stretches and lifestyle modifications per AVS and verbally.   OSTEOPATHIC/STRUCTURAL EXAM:   C2 FRS right (Flexed, Rotated & Sidebent) C6 ERS left (Extended, Rotated & Sidebent) T2 FRS right (Flexed, Rotated & Sidebent) T8 - T10 Neutral, Rotated left, Sidebent right Rib 7 Right  Posterior

## 2018-04-20 NOTE — Patient Instructions (Signed)
Please perform the exercise program that we have prepared for you and gone over in detail on a daily basis.  In addition to the handout you were provided you can access your program through: [email or web address]   Your unique program code is: [email or web address]

## 2018-04-20 NOTE — Progress Notes (Signed)
Veverly FellsMichael D. Delorise Shinerigby, DO  [facility] [phone]  Tammy Alexandriaamika Nowak - 42 y.o. female MRN [id number]  Date of birth: 21-Oct-1975  Visit Date: 04/20/2018  PCP: Sheliah Hatchabori, Katherine E, MD   Referred by: Sheliah Hatchabori, Katherine E, MD  Scribe(s) for today's visit: Stevenson ClinchBrandy Coleman, CMA  SUBJECTIVE:  [name] is here for Follow-up (neck pain)   04/01/2018: Her neck pain symptoms INITIALLY: Began about 2 months ago and MOI is unknown. She denies past injury to the neck.  Described as moderate aching/pinching, radiating to both shoulder (L>R).  Worsened with movement, side-to-side, looking up and down.  Improved with rest. Additional associated symptoms include: She reports HA d/t neck pain. She denies visual changes. She feels that ROM in her neck has decreased d/t pain.    At this time symptoms are worsening compared to onset, more frequent and more severe. Pain wakes her at night when she rolls to her side.  She was taking Flexeril which helped at night but it didn't seem to help during the day. She has been sleeping on a heating pas nightly with some relief. She takes Diclofenac for arthritis. She takes Tylenol 1000 mg with minimal relief. She also takes Glucosamine and Turmeric. She takes Goody's powder for her HA. She has also tried using [employer] Balm with minimal relief.  She received Toradol inj 02/04/18 and got short term relief.  No recent XR of neck.   04/20/2018:  Compared to the last office visit, her previously described symptoms are improving. She has occasional sharp pain when looking side-to-side or looking up.  She reports that at times her L hand will start shaking, this is new since her last visit. She has occasional radiation of pain into the L shoulder. Shaking normally occurs when pain is radiating. Pain doesn't extend beyond the deltoid. HA have lessened in frequency.  Current symptoms are moderate & are radiating to the left arm/upper  back.  There have been no changes to medication regimen to help with pain. She has been going HEP and [place] with little difficulty. She still has slight pain when doing towel stretches.    REVIEW OF SYSTEMS: Reports night time disturbances. Denies fevers, chills, or night sweats. Denies unexplained weight loss. Denies personal history of cancer. Reports changes in bowel or bladder habits - has colonoscopy scheduled for 05/21/2018. Denies recent unreported falls. Denies new or worsening dyspnea or wheezing. Reports headaches or dizziness.  Denies numbness, tingling or weakness  In the extremities.  Denies dizziness or presyncopal episodes Denies lower extremity edema     HISTORY & PERTINENT PRIOR DATA:  Significant/pertinent history, findings, studies include:  reports that she has never smoked. She has never used smokeless tobacco. No results for input(s): HGBA1C, LABURIC, CREATINE in the last 8760 hours. No specialty comments available. No problems updated.  Otherwise prior history reviewed and updated per electronic medical record.   OBJECTIVE:  VS:  HT:5' 4.5" (163.8 cm)   WT:180 lb (81.6 kg)  BMI:30.43    BP:(Abnormal) 130/92  HR:77bpm  TEMP: ( )  RESP:99 %   PHYSICAL EXAM: CONSTITUTIONAL: Well-developed, Well-nourished and In no acute distress Alert & appropriately interactive. and Not depressed or anxious appearing. RESPIRATORY: No increased work of breathing and Trachea Midline EYES: Pupils are equal., EOM intact without nystagmus. and No scleral icterus.  Upper extremities: Warm and well perfused   MSK Exam: SHOULDER Findings: No stiffness, no weakness, no crepitus noted  Neck:  Well aligned, no significant torticollis  Midline Bony TTP: none   Paraspinal Muscle Spasm: No  CERVICAL ROM: normal range of motion  NEURAL TENSION SIGNS Right Left  Brachial Plexus Squeeze: (Optional): normal, no pain (Optional): normal, no pain  Arm Squeeze Test:  (Optional): normal, no pain (Optional): normal, no pain  Spurling's Compression Test: (Optional): normal, no pain (Optional): normal, no pain  Lhermitte's Compression test: Negative, no radiating pain   REFLEXES Right Left  DTR - C5 -Biceps  2+ 2+  DTR - C6 - Brachiorad 2+ 2+  DTR - C7 - Triceps 2+ 2+   MOTOR TESTING: Intact in all UE myotomes    PROCEDURES & DATA REVIEWED:  OMT performed per procedure note X-Rays obtained today and reviewed with the patient that showed: 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  ASSESSMENT   1. Neck pain   2. Musculoskeletal neck pain   3. Somatic dysfunction of cervical region   4. Somatic dysfunction of thoracic region   5. Somatic dysfunction of rib cage region   6. Spondylosis     PLAN:    prescription for NSAID given Continue your home exercise program    Today for Celebrex provided.  She should discontinue her old diclofenac and ibuprofen at this time. Osteopathic manipulation was performed today based on physical exam findings.  Please see procedure note for further information including Osteopathic Exam findings Discussed the foundation of treatment for this condition is physical therapy and/or daily (5-6 days/week) therapeutic exercises, focusing on core strengthening, coordination, neuromuscular control/reeducation.  Therapeutic exercises prescribed per procedure note.   Follow-up: Return in about 3 weeks (around 05/11/2018) for consideration of repeat Osteopathic Manipulation.      Please see additional documentation for Objective, Assessment and Plan sections. Pertinent additional documentation may be included in corresponding procedure notes, imaging studies, problem based documentation and patient instructions. Please see these sections of the encounter for additional information regarding this visit.  CMA/ATC served as Neurosurgeonscribe during this visit. History,  Physical, and Plan performed by medical provider. Documentation and orders reviewed and attested to.      Andrena MewsMichael D Tyronn Golda, DO    [facility]

## 2018-05-07 ENCOUNTER — Telehealth: Payer: Self-pay | Admitting: Family Medicine

## 2018-05-07 DIAGNOSIS — M542 Cervicalgia: Secondary | ICD-10-CM

## 2018-05-07 NOTE — Telephone Encounter (Signed)
Copied from CRM [id number]. Topic: Quick Communication - See Telephone Encounter >> May 07, 2018 10:29 AM Jens Som A wrote: CRM for notification. See Telephone encounter for: 05/07/18.Patient is now interested in getting a MRI done for her neck as advised by Dr Berline Chough.  Dennie Bible is requesting a referral please advise patients call back number is [phone]

## 2018-05-07 NOTE — Telephone Encounter (Signed)
Reviewed OV notes, OK to order MR c-spine?

## 2018-05-07 NOTE — Addendum Note (Signed)
Addended by: Dierdre Searles on: 05/07/2018 01:36 PM   Modules accepted: Orders

## 2018-05-07 NOTE — Telephone Encounter (Signed)
Called pt and left VM to call the office.  

## 2018-05-07 NOTE — Addendum Note (Signed)
Addended by: Dierdre Searles on: 05/07/2018 04:56 PM   Modules accepted: Orders

## 2018-05-10 MED FILL — PLENVU 140 GM SOLR: 140 | 1 days supply | Qty: 3 | Fill #0

## 2018-05-10 MED FILL — CYCLOBENZAPRINE HCL 10 MG T: 10 | 10 days supply | Qty: 30 | Fill #1

## 2018-05-13 NOTE — Telephone Encounter (Signed)
Called pt and left VM. Unable to reach pt by phone. Looks like MR c-spine was also ordered by PCP and has been scheduled.

## 2018-05-14 ENCOUNTER — Encounter: Payer: Self-pay | Admitting: Podiatry

## 2018-05-14 ENCOUNTER — Ambulatory Visit: Payer: No Typology Code available for payment source | Admitting: Podiatry

## 2018-05-14 ENCOUNTER — Ambulatory Visit (INDEPENDENT_AMBULATORY_CARE_PROVIDER_SITE_OTHER): Payer: No Typology Code available for payment source | Admitting: Sports Medicine

## 2018-05-14 ENCOUNTER — Encounter: Payer: Self-pay | Admitting: Sports Medicine

## 2018-05-14 ENCOUNTER — Ambulatory Visit (INDEPENDENT_AMBULATORY_CARE_PROVIDER_SITE_OTHER): Payer: No Typology Code available for payment source

## 2018-05-14 VITALS — BP 114/86 | HR 87 | Ht 64.5 in | Wt 175.4 lb

## 2018-05-14 VITALS — BP 110/74 | HR 86

## 2018-05-14 DIAGNOSIS — R52 Pain, unspecified: Secondary | ICD-10-CM | POA: Diagnosis not present

## 2018-05-14 DIAGNOSIS — M779 Enthesopathy, unspecified: Secondary | ICD-10-CM | POA: Diagnosis not present

## 2018-05-14 DIAGNOSIS — M542 Cervicalgia: Secondary | ICD-10-CM

## 2018-05-14 DIAGNOSIS — L600 Ingrowing nail: Secondary | ICD-10-CM | POA: Diagnosis not present

## 2018-05-14 MED ORDER — DIAZEPAM 5 MG PO TABS: 5.0000 mg | ORAL_TABLET | Freq: Three times a day (TID) | ORAL | 1 refills | Status: DC | PRN

## 2018-05-14 MED FILL — diazePAM 5 MG TABS: 5 | 10 days supply | Qty: 30 | Fill #0

## 2018-05-14 NOTE — Progress Notes (Signed)
Veverly FellsMichael D. Delorise Shinerigby, DO  [facility] [place] [phone]  Tammy Alexandriaamika Altadonna - 42 y.o. female MRN [id number]  Date of birth: October 31, 1975  Visit Date: 05/14/2018  PCP: Sheliah Hatchabori, Katherine E, MD   Referred by: Sheliah Hatchabori, Katherine E, MD  Scribe(s) for today's visit: Christoper FabianMolly Weber, LAT, ATC  SUBJECTIVE:  [name] is here for Follow-up (Neck pain)  04/01/2018: Her neck pain symptoms INITIALLY: Began about 2 months ago and MOI is unknown. She denies past injury to the neck.  Described as moderate aching/pinching, radiating to both shoulder (L>R).  Worsened with movement, side-to-side, looking up and down.  Improved with rest. Additional associated symptoms include: She reports HA d/t neck pain. She denies visual changes. She feels that ROM in her neck has decreased d/t pain.    At this time symptoms are worsening compared to onset, more frequent and more severe. Pain wakes her at night when she rolls to her side.  She was taking Flexeril which helped at night but it didn't seem to help during the day. She has been sleeping on a heating pas nightly with some relief. She takes Diclofenac for arthritis. She takes Tylenol 1000 mg with minimal relief. She also takes Glucosamine and Turmeric. She takes Goody's powder for her HA. She has also tried using [employer] Balm with minimal relief.  She received Toradol inj 02/04/18 and got short term relief.  No recent XR of neck.   04/20/2018:  Compared to the last office visit, her previously described symptoms are improving. She has occasional sharp pain when looking side-to-side or looking up.  She reports that at times her L hand will start shaking, this is new since her last visit. She has occasional radiation of pain into the L shoulder. Shaking normally occurs when pain is radiating. Pain doesn't extend beyond the deltoid. HA have lessened in frequency.  Current symptoms are moderate & are radiating to the left arm/upper  back.  There have been no changes to medication regimen to help with pain. She has been going HEP and [place] with little difficulty. She still has slight pain when doing towel stretches.   05/14/2018: Compared to the last office visit, her previously described neck pain symptoms are worse.  She states that the majority of her neck pain is along the cervical erector spinae w/ radiating pain into her B shoulders.  She states that her headaches have worsened and is now experiencing some random dizziness too. Current symptoms are moderate & are radiating to B shoulders. She has been taking Celebrex.  She has been going HEP 2x/week with little difficulty. She still has slight pain when doing towel stretches.   REVIEW OF SYSTEMS: Reports night time disturbances. Denies fevers, chills, or night sweats. Denies unexplained weight loss. Denies personal history of cancer. Reports changes in bowel or bladder habits - has colonoscopy scheduled for 05/21/2018. Denies recent unreported falls. Denies new or worsening dyspnea or wheezing. Reports headaches or dizziness.  Denies numbness, tingling or weakness  In the extremities.  Denies dizziness or presyncopal episodes Denies lower extremity edema    HISTORY:  Prior history reviewed and updated per electronic medical record.  Social History   Occupational History  . Occupation: [employer]  Tobacco Use  . Smoking status: Never Smoker  . Smokeless tobacco: Never Used  Substance and Sexual Activity  . Alcohol use: No  . Drug use: No  . Sexual activity: Yes    Birth control/protection: IUD  Social History   Social History Narrative  . Not on file    DATA OBTAINED & REVIEWED:  No results for input(s): HGBA1C, LABURIC, CREATINE in the last 8760 hours. 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   OBJECTIVE:  VS:  HT:5' 4.5" (163.8 cm)    WT:175 lb 6.4 oz (79.6 kg)  BMI:29.65    BP:114/86  HR:87bpm  TEMP: ( )  RESP:98 %   PHYSICAL EXAM: CONSTITUTIONAL: Well-developed, Well-nourished and In no acute distress PSYCHIATRIC: Alert & appropriately interactive. and Not depressed or anxious appearing. RESPIRATORY: No increased work of breathing and Trachea Midline EYES: Pupils are equal., EOM intact without nystagmus. and No scleral icterus.  Upper and Lower Extremities VASCULAR EXAM: Warm and well perfused NEURO: unremarkable  MSK Exam: Cervical spine  Well aligned, no significant deformity. No overlying skin changes. No focal bony tenderness TTP over Bilateral paraspinal muscle   RANGE OF MOTION & STRENGTH  Limited side bending and rotation.   SPECIALITY TESTING:  SHOULDER Findings: No stiffness, no weakness, no crepitus noted  NEURAL TENSION SIGNS Right Left  Brachial Plexus Squeeze: normal, no pain normal, no pain  Arm Squeeze Test: normal, no pain normal, no pain  Spurling's Compression Test: positive, moderate pain positive, moderate pain  Lhermitte's Compression test: Pain but no radiation   REFLEXES Right Left  DTR - C5 -Biceps  2+ 2+  DTR - C6 - Brachiorad 2+ 2+  DTR - C7 - Triceps 2+ 2+   UMN - Hoffman's negative Negative/Normal       MOTOR TESTING: Intact in all UE myotomes     ASSESSMENT   1. Cervicalgia   2. Neck pain   3. Musculoskeletal neck pain     PLAN:  Pertinent additional documentation may be included in corresponding procedure notes, imaging studies, problem based documentation and patient instructions.  Procedures:  . None  Medications:  Meds ordered this encounter  Medications  . diazepam (VALIUM) 5 MG tablet    Sig: Take 1 tablet (5 mg total) by mouth every 8 (eight) hours as needed for anxiety or muscle spasms.    Dispense:  30 tablet    Refill:  1    Discussion/Instructions: . Continue previously prescribed home exercise program.  . Further Testing ordered:  MRI has been ordered.  We will plan to follow-up with her to review these results myself. . Discussed red flag symptoms that warrant earlier emergent evaluation and patient voices understanding. . Activity modifications and the importance of avoiding exacerbating activities (limiting pain to no more than a 4 / 10 during or following activity) recommended and discussed.  Follow-up:  . Return in about 2 weeks (around 05/28/2018).  .      CMA/ATC served as Neurosurgeon during this visit. History, Physical, and Plan performed by medical provider. Documentation and orders reviewed and attested to.      Andrena Mews, DO    [facility]

## 2018-05-14 NOTE — Progress Notes (Signed)
Subjective:   Patient ID: [name], female   DOB: 42 y.o.   MRN: [id number]   HPI Patient is concerned because she lost her big toenail right and is developing discoloration of the big toenail left.  Is also had extensive foot reconstructive surgery and still developed pain in her feet and has some discomfort in her hips and neck.  Patient does have flatfoot deformity and did have correction done with one foot being done 5 years ago and the other a number of years ago.  Patient does not smoke and likes to be active   Review of Systems  All other systems reviewed and are negative.       Objective:  Physical Exam  Constitutional: She appears well-developed and well-nourished.  Cardiovascular: Intact distal pulses.  Pulmonary/Chest: Effort normal.  Musculoskeletal: Normal range of motion.  Neurological: She is alert.  Skin: Skin is warm.  Nursing note and vitals reviewed.   Neurovascular status intact with muscle strength adequate range of motion within normal limits.  Patient's found to have discoloration of the left hallux nail and loss of the left hallux nail.  Patient is also noted to have scars on both feet with extensive surgery right over left foot and does have good digital perfusion well oriented x3 and has pain in her feet in general     Assessment:  H&P condition reviewed and at this point I did x-ray both feet.  I do not recommend treatment for the nails I think it is due to the position of the big toes and I did apply cushions but I do not think it is fungus and I think it is trauma.  I have recommended long-term orthotics and she would do well with a functional type orthotic and I am sending to ped orthotist for this to be done explaining the type of orthotic will make for this patient.  To be made of a good supportive material then with cushion top cover.  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

## 2018-05-15 ENCOUNTER — Encounter: Payer: Self-pay | Admitting: Sports Medicine

## 2018-05-20 ENCOUNTER — Encounter: Payer: No Typology Code available for payment source | Admitting: Gastroenterology

## 2018-05-21 ENCOUNTER — Other Ambulatory Visit: Payer: Self-pay

## 2018-05-21 ENCOUNTER — Other Ambulatory Visit (INDEPENDENT_AMBULATORY_CARE_PROVIDER_SITE_OTHER): Payer: No Typology Code available for payment source

## 2018-05-21 ENCOUNTER — Ambulatory Visit (AMBULATORY_SURGERY_CENTER): Payer: No Typology Code available for payment source | Admitting: Gastroenterology

## 2018-05-21 ENCOUNTER — Encounter: Payer: Self-pay | Admitting: Gastroenterology

## 2018-05-21 VITALS — BP 147/97 | HR 76 | Temp 98.2°F | Resp 18 | Ht 64.5 in | Wt 175.0 lb

## 2018-05-21 DIAGNOSIS — K529 Noninfective gastroenteritis and colitis, unspecified: Secondary | ICD-10-CM

## 2018-05-21 DIAGNOSIS — K921 Melena: Secondary | ICD-10-CM | POA: Diagnosis not present

## 2018-05-21 DIAGNOSIS — R197 Diarrhea, unspecified: Secondary | ICD-10-CM

## 2018-05-21 DIAGNOSIS — R1030 Lower abdominal pain, unspecified: Secondary | ICD-10-CM

## 2018-05-21 DIAGNOSIS — R195 Other fecal abnormalities: Secondary | ICD-10-CM

## 2018-05-21 DIAGNOSIS — K52832 Lymphocytic colitis: Secondary | ICD-10-CM | POA: Insufficient documentation

## 2018-05-21 LAB — CBC WITH DIFFERENTIAL/PLATELET
BASOS ABS: 0.1 10*3/uL (ref 0.0–0.1)
Basophils Relative: 1.3 % (ref 0.0–3.0)
EOS ABS: 0.1 10*3/uL (ref 0.0–0.7)
Eosinophils Relative: 2.8 % (ref 0.0–5.0)
HCT: 39.6 % (ref 36.0–46.0)
Hemoglobin: 13.4 g/dL (ref 12.0–15.0)
Lymphocytes Relative: 22.3 % (ref 12.0–46.0)
Lymphs Abs: 1.1 10*3/uL (ref 0.7–4.0)
MCHC: 33.9 g/dL (ref 30.0–36.0)
MCV: 86.4 fl (ref 78.0–100.0)
MONOS PCT: 7.3 % (ref 3.0–12.0)
Monocytes Absolute: 0.4 10*3/uL (ref 0.1–1.0)
NEUTROS PCT: 66.3 % (ref 43.0–77.0)
Neutro Abs: 3.2 10*3/uL (ref 1.4–7.7)
Platelets: 327 10*3/uL (ref 150.0–400.0)
RBC: 4.58 Mil/uL (ref 3.87–5.11)
RDW: 12.5 % (ref 11.5–15.5)
WBC: 4.9 10*3/uL (ref 4.0–10.5)

## 2018-05-21 LAB — IGA: IgA: 175 mg/dL (ref 68–378)

## 2018-05-21 MED ORDER — SODIUM CHLORIDE 0.9 % IV SOLN: 500.0000 mL | Freq: Once | INTRAVENOUS | Status: DC

## 2018-05-21 NOTE — Progress Notes (Signed)
Report to PACU, RN, vss, BBS= Clear.  

## 2018-05-21 NOTE — Op Note (Signed)
[facility] Patient Name: [name] Procedure Date: 05/21/2018 2:33 PM MRN: [id number] Endoscopist: Sherilyn Cooter L. Myrtie Neither , MD Age: 42 Referring MD:  Date of Birth: [birth year] Gender: Female Account #: [id number] Procedure:                Colonoscopy Indications:              Lower abdominal pain, Chronic diarrhea, Heme                            positive stool Medicines:                Monitored Anesthesia Care Procedure:                Pre-Anesthesia Assessment:                           - Prior to the procedure, a History and Physical                            was performed, and patient medications and                            allergies were reviewed. The patient's tolerance of                            previous anesthesia was also reviewed. The risks                            and benefits of the procedure and the sedation                            options and risks were discussed with the patient.                            All questions were answered, and informed consent                            was obtained. Prior Anticoagulants: The patient has                            taken no previous anticoagulant or antiplatelet                            agents. ASA Grade Assessment: II - A patient with                            mild systemic disease. After reviewing the risks                            and benefits, the patient was deemed in                            satisfactory condition to undergo the procedure.  After obtaining informed consent, the colonoscope                            was passed under direct vision. Throughout the                            procedure, the patient's blood pressure, pulse, and                            oxygen saturations were monitored continuously. The                            Colonoscope was introduced through the anus and                            advanced to the the terminal ileum, with                   identification of the appendiceal orifice and IC                            valve. The colonoscopy was performed without                            difficulty. The patient tolerated the procedure                            well. The quality of the bowel preparation was                            excellent. The terminal ileum, ileocecal valve,                            appendiceal orifice, and rectum were photographed.                            The quality of the bowel preparation was evaluated                            using the BBPS [facility] Bowel Preparation Scale)                            with scores of: Right Colon = 3, Transverse Colon =                            3 and Left Colon = 3 (entire mucosa seen well with                            no residual staining, small fragments of stool or                            opaque liquid). The total BBPS score equals 9. The  bowel preparation used was Plenvu. Scope In: 2:40:49 PM Scope Out: 2:53:21 PM Scope Withdrawal Time: 0 hours 8 minutes 8 seconds  Total Procedure Duration: 0 hours 12 minutes 32 seconds  Findings:                 The perianal and digital rectal examinations were                            normal.                           The terminal ileum appeared normal.                           The colon (entire examined portion) appeared                            normal. Biopsies for histology were taken with a                            cold forceps from the right colon and left colon                            for evaluation of microscopic colitis.                           The exam was otherwise without abnormality on                            direct and retroflexion views. Complications:            No immediate complications. Estimated Blood Loss:     Estimated blood loss was minimal. Impression:               - The examined portion of the ileum was normal.                            - The entire examined colon is normal. Biopsied.                           - The examination was otherwise normal on direct                            and retroflexion views.                           No cause for heme positive stool was seen -                            suspected false positive test. Recommendation:           - Patient has a contact number available for                            emergencies. The signs and symptoms of potential  delayed complications were discussed with the                            patient. Return to normal activities tomorrow.                            Written discharge instructions were provided to the                            patient.                           - Resume previous diet.                           - Continue present medications.                           - Await pathology results.                           - Labs today: CBC, Tissue Transglutaminase (IgA)                            and total IgA level Lyndol Vanderheiden L. Myrtie Neither, MD 05/21/2018 2:59:02 PM This report has been signed electronically.

## 2018-05-21 NOTE — Progress Notes (Signed)
Called to room to assist during endoscopic procedure.  Patient ID and intended procedure confirmed with present staff. Received instructions for my participation in the procedure from the performing physician.  

## 2018-05-21 NOTE — Patient Instructions (Signed)
*   labs ordered*  YOU HAD AN ENDOSCOPIC PROCEDURE TODAY AT THE [facility]:   Refer to the procedure report that was given to you for any specific questions about what was found during the examination.  If the procedure report does not answer your questions, please call your gastroenterologist to clarify.  If you requested that your care partner not be given the details of your procedure findings, then the procedure report has been included in a sealed envelope for you to review at your convenience later.  YOU SHOULD EXPECT: Some feelings of bloating in the abdomen. Passage of more gas than usual.  Walking can help get rid of the air that was put into your GI tract during the procedure and reduce the bloating. If you had a lower endoscopy (such as a colonoscopy or flexible sigmoidoscopy) you may notice spotting of blood in your stool or on the toilet paper. If you underwent a bowel prep for your procedure, you may not have a normal bowel movement for a few days.  Please Note:  You might notice some irritation and congestion in your nose or some drainage.  This is from the oxygen used during your procedure.  There is no need for concern and it should clear up in a day or so.  SYMPTOMS TO REPORT IMMEDIATELY:   Following lower endoscopy (colonoscopy or flexible sigmoidoscopy):  Excessive amounts of blood in the stool  Significant tenderness or worsening of abdominal pains  Swelling of the abdomen that is new, acute  Fever of 100F or higher   For urgent or emergent issues, a gastroenterologist can be reached at any hour by calling [phone].   DIET:  We do recommend a small meal at first, but then you may proceed to your regular diet.  Drink plenty of fluids but you should avoid alcoholic beverages for 24 hours.  ACTIVITY:  You should plan to take it easy for the rest of today and you should NOT DRIVE or use heavy machinery until tomorrow (because of the sedation medicines used  during the test).    FOLLOW UP: Our staff will call the number listed on your records the next business day following your procedure to check on you and address any questions or concerns that you may have regarding the information given to you following your procedure. If we do not reach you, we will leave a message.  However, if you are feeling well and you are not experiencing any problems, there is no need to return our call.  We will assume that you have returned to your regular daily activities without incident.  If any biopsies were taken you will be contacted by phone or by letter within the next 1-3 weeks.  Please call us at [phone] if you have not heard about the biopsies in 3 weeks.    SIGNATURES/CONFIDENTIALITY: You and/or your care partner have signed paperwork which will be entered into your electronic medical record.  These signatures attest to the fact that that the information above on your After Visit Summary has been reviewed and is understood.  Full responsibility of the confidentiality of this discharge information lies with you and/or your care-partner.

## 2018-05-24 ENCOUNTER — Telehealth: Payer: Self-pay

## 2018-05-24 LAB — TISSUE TRANSGLUTAMINASE, IGA: (tTG) Ab, IgA: 3 U/mL

## 2018-05-24 MED FILL — OSCIMIN SL 0.125 MG TABLET: 0.125 | 11 days supply | Qty: 45 | Fill #0

## 2018-05-24 NOTE — Telephone Encounter (Signed)
Attempted to reach pt. With follow-up call following endoscopic procedure 05/21/2018.  LM on pt. Voice mail to call if she has any questions or concerns. 

## 2018-05-24 NOTE — Telephone Encounter (Signed)
  Follow up Call-  Call back number 05/21/2018  Post procedure Call Back phone  # [phone]  Permission to leave phone message Yes  Some recent data might be hidden     Left message

## 2018-05-26 ENCOUNTER — Ambulatory Visit (INDEPENDENT_AMBULATORY_CARE_PROVIDER_SITE_OTHER): Payer: No Typology Code available for payment source | Admitting: Orthotics

## 2018-05-26 ENCOUNTER — Ambulatory Visit
Admission: RE | Admit: 2018-05-26 | Discharge: 2018-05-26 | Disposition: A | Discharge status: Home / Self Care | Payer: No Typology Code available for payment source | Source: Ambulatory Visit | Attending: Sports Medicine | Admitting: Sports Medicine

## 2018-05-26 DIAGNOSIS — M779 Enthesopathy, unspecified: Secondary | ICD-10-CM

## 2018-05-26 DIAGNOSIS — M542 Cervicalgia: Secondary | ICD-10-CM

## 2018-05-26 DIAGNOSIS — R52 Pain, unspecified: Secondary | ICD-10-CM

## 2018-05-26 NOTE — Progress Notes (Signed)
Patient seen today for f/o upon recommendation Dr. Charlsie Merles.  Patient presents with b/l foot pain alternating in severity and discomfort.  Patient points to navicular as a source of discomfort also sinus tarsi.   Patietn is pes planus.  Plan on supportivie device with navicular cut out/pad and 4* varus posting.    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  Plan on 1/8" b/l lift.

## 2018-05-28 ENCOUNTER — Encounter: Payer: Self-pay | Admitting: Sports Medicine

## 2018-05-28 ENCOUNTER — Ambulatory Visit (INDEPENDENT_AMBULATORY_CARE_PROVIDER_SITE_OTHER): Payer: No Typology Code available for payment source | Admitting: Sports Medicine

## 2018-05-28 ENCOUNTER — Other Ambulatory Visit: Payer: Self-pay

## 2018-05-28 VITALS — BP 134/96 | HR 80 | Ht 64.5 in | Wt 176.2 lb

## 2018-05-28 DIAGNOSIS — M47812 Spondylosis without myelopathy or radiculopathy, cervical region: Secondary | ICD-10-CM

## 2018-05-28 DIAGNOSIS — M542 Cervicalgia: Secondary | ICD-10-CM | POA: Diagnosis not present

## 2018-05-28 MED ORDER — BUDESONIDE 3 MG PO CPEP: 9.0000 mg | ORAL_CAPSULE | Freq: Every day | ORAL | 2 refills | Status: DC

## 2018-05-28 MED FILL — BUDESONIDE 3 MG CPEP: 3 | 30 days supply | Qty: 90 | Fill #0

## 2018-05-28 NOTE — Progress Notes (Signed)
[place]. [facility] at [facility] [phone]  [name] - 42 y.o. female MRN [id number]  Date of birth: August 16, 1976  Visit Date: 05/28/2018  PCP: Sheliah Hatch, MD   Referred by: Sheliah Hatch, MD  Scribe(s) for today's visit: Stevenson Clinch, CMA  SUBJECTIVE:  [name] is here for Follow-up (neck pain)  04/01/2018: Her neck pain symptoms INITIALLY: Began about 2 months ago and MOI is unknown. She denies past injury to the neck.  Described as moderate aching/pinching, radiating to both shoulder (L>R).  Worsened with movement, side-to-side, looking up and down.  Improved with rest. Additional associated symptoms include: She reports HA d/t neck pain. She denies visual changes. She feels that ROM in her neck has decreased d/t pain.    At this time symptoms are worsening compared to onset, more frequent and more severe. Pain wakes her at night when she rolls to her side.  She was taking Flexeril which helped at night but it didn't seem to help during the day. She has been sleeping on a heating pas nightly with some relief. She takes Diclofenac for arthritis. She takes Tylenol 1000 mg with minimal relief. She also takes Glucosamine and Turmeric. She takes Goody's powder for her HA. She has also tried using [employer] with minimal relief.  She received Toradol inj 02/04/18 and got short term relief.  No recent XR of neck.   04/20/2018:  Compared to the last office visit, her previously described symptoms are improving. She has occasional sharp pain when looking side-to-side or looking up.  She reports that at times her L hand will start shaking, this is new since her last visit. She has occasional radiation of pain into the L shoulder. Shaking normally occurs when pain is radiating. Pain doesn't extend beyond the deltoid. HA have lessened in frequency.  Current symptoms are moderate & are radiating to the left arm/upper  back.  There have been no changes to medication regimen to help with pain. She has been going HEP and [place] with little difficulty. She still has slight pain when doing towel stretches.   05/14/2018: Compared to the last office visit, her previously described neck pain symptoms are worse.  She states that the majority of her neck pain is along the cervical erector spinae w/ radiating pain into her B shoulders.  She states that her headaches have worsened and is now experiencing some random dizziness too. Current symptoms are moderate & are radiating to B shoulders. She has been taking Celebrex.  She has been going HEP 2x/week with little difficulty. She still has slight pain when doing towel stretches.   05/28/2018: Compared to the last office visit, her previously described symptoms show no change.  Current symptoms are moderate & are radiating to both shoulders.  She has been taking Celebrex prn with some relief. She takes Valium at bedtime which helps with sleep. She uses tiger balm, Tylenol, tumeric, glucosamine chondroitin. She has been using ice and heat, ice seems to work better. She has not been doing HEP.   Review results of MRI.    REVIEW OF SYSTEMS: Reports night time disturbances, she is able to sleep without disturbance when taking Valium. Denies fevers, chills, or night sweats. Denies unexplained weight loss. Denies personal history of cancer. Reports changes in bowel or bladder habits - has colonoscopy scheduled for 05/21/2018, lymphocytic colitis. Denies recent unreported falls. Denies new or worsening dyspnea or wheezing. Reports headaches  or dizziness.  Denies numbness, tingling or weakness  In the extremities.  Denies dizziness or presyncopal episodes Denies lower extremity edema    HISTORY:  Prior history reviewed and updated per electronic medical record.  Social History   Occupational History  . Occupation: Charity fundraiser  Tobacco Use  . Smoking status: Never Smoker    . Smokeless tobacco: Never Used  Substance and Sexual Activity  . Alcohol use: No  . Drug use: No  . Sexual activity: Yes    Birth control/protection: IUD   Social History   Social History Narrative  . Not on file    DATA OBTAINED & REVIEWED:  No results for input(s): HGBA1C, LABURIC, CREATINE in the last 8760 hours. 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   OBJECTIVE:  VS:  HT:    WT:   BMI:     BP:   HR: bpm  TEMP: ( )  RESP:    PHYSICAL EXAM: CONSTITUTIONAL: Well-developed, Well-nourished and In no acute distress PSYCHIATRIC: Alert & appropriately interactive. and Not depressed or anxious appearing. RESPIRATORY: No increased work of breathing and Trachea Midline EYES: Pupils are equal., EOM intact without nystagmus. and No scleral icterus.  Upper and Lower Extremities VASCULAR EXAM: Warm and well perfused NEURO: unremarkable  MSK Exam: Cervical spine  Well aligned, no significant deformity. No overlying skin changes. No focal bony tenderness TTP over Bilateral paraspinal muscle   RANGE OF MOTION & STRENGTH  Limited side bending and rotation.   SPECIALITY TESTING:  SHOULDER Findings: No stiffness, no weakness, no crepitus noted  NEURAL TENSION SIGNS Right Left  Brachial Plexus Squeeze: normal, no pain normal, no pain  Arm Squeeze Test: normal, no pain normal, no pain  Spurling's Compression Test: positive, moderate pain positive, moderate pain  Lhermitte's Compression test: Pain but no radiation   REFLEXES Right Left  DTR - C5 -Biceps  2+ 2+  DTR - C6 - Brachiorad 2+ 2+  DTR - C7 - Triceps 2+ 2+   UMN - Hoffman's negative Negative/Normal       MOTOR TESTING: Intact in all UE myotomes     ASSESSMENT   1. Cervicalgia   2. Cervical spondylosis     PLAN:  Pertinent additional documentation may be included in corresponding  procedure notes, imaging studies, problem based documentation and patient instructions.  Procedures:  . None  Medications:  No orders of the defined types were placed in this encounter.  Discussion/Instructions: Cervical spondylosis Referral for epidural steroid injection placed today.    >50% of this 25 minute visit spent in direct patient counseling and/or coordination of care.  Discussion was focused on education regarding the in discussing the pathoetiology and anticipated clinical course of the above condition.  . Referral placed To Dr. Alvester Morin . Discussed red flag symptoms that warrant earlier emergent evaluation and patient voices understanding. . Activity modifications and the importance of avoiding exacerbating activities (limiting pain to no more than a 4 / 10 during or following activity) recommended and discussed.  Follow-up:  . Return in about 8 weeks (around 07/23/2018).  . If any lack of improvement consider: Referral for orthospine     CMA/ATC served as scribe during this visit. History, Physical, and Plan performed by medical provider. Documentation and orders reviewed and attested to.      Andrena Mews, DO    [facility]

## 2018-05-31 ENCOUNTER — Other Ambulatory Visit: Payer: Self-pay | Admitting: Podiatry

## 2018-05-31 DIAGNOSIS — M779 Enthesopathy, unspecified: Secondary | ICD-10-CM

## 2018-06-02 ENCOUNTER — Encounter: Payer: Self-pay | Admitting: Family Medicine

## 2018-06-02 MED ORDER — PROMETHAZINE-DM 6.25-15 MG/5ML PO SYRP: 5.0000 mL | ORAL_SOLUTION | Freq: Four times a day (QID) | ORAL | 0 refills | Status: DC | PRN

## 2018-06-02 MED FILL — PROMETHAZINE W/DM SYRUP: 6.25-15 | 12 days supply | Qty: 240 | Fill #0

## 2018-06-08 ENCOUNTER — Telehealth (INDEPENDENT_AMBULATORY_CARE_PROVIDER_SITE_OTHER): Payer: Self-pay | Admitting: *Deleted

## 2018-06-15 ENCOUNTER — Encounter: Payer: Self-pay | Admitting: Family Medicine

## 2018-06-16 ENCOUNTER — Ambulatory Visit: Payer: No Typology Code available for payment source | Admitting: Family Medicine

## 2018-06-16 MED ORDER — PROMETHAZINE-DM 6.25-15 MG/5ML PO SYRP: 5.0000 mL | ORAL_SOLUTION | Freq: Four times a day (QID) | ORAL | 0 refills | Status: DC | PRN

## 2018-06-16 MED FILL — diazePAM 5 MG TABS: 5 | 10 days supply | Qty: 30 | Fill #1

## 2018-06-16 MED FILL — CELECOXIB 200 MG CAPSULE: 200 | 30 days supply | Qty: 30 | Fill #1

## 2018-06-16 MED FILL — PROMETHAZINE W/DM SYRUP: 6.25-15 | 12 days supply | Qty: 240 | Fill #0

## 2018-06-18 ENCOUNTER — Other Ambulatory Visit (INDEPENDENT_AMBULATORY_CARE_PROVIDER_SITE_OTHER): Payer: Self-pay | Admitting: Physical Medicine and Rehabilitation

## 2018-06-18 ENCOUNTER — Encounter: Payer: Self-pay | Admitting: Family Medicine

## 2018-06-18 ENCOUNTER — Ambulatory Visit (INDEPENDENT_AMBULATORY_CARE_PROVIDER_SITE_OTHER): Payer: No Typology Code available for payment source | Admitting: Family Medicine

## 2018-06-18 ENCOUNTER — Other Ambulatory Visit: Payer: Self-pay

## 2018-06-18 VITALS — BP 122/82 | HR 80 | Temp 98.3°F | Resp 16 | Ht 65.0 in | Wt 180.2 lb

## 2018-06-18 DIAGNOSIS — R05 Cough: Secondary | ICD-10-CM | POA: Diagnosis not present

## 2018-06-18 DIAGNOSIS — B9689 Other specified bacterial agents as the cause of diseases classified elsewhere: Secondary | ICD-10-CM

## 2018-06-18 DIAGNOSIS — J329 Chronic sinusitis, unspecified: Secondary | ICD-10-CM

## 2018-06-18 DIAGNOSIS — F411 Generalized anxiety disorder: Secondary | ICD-10-CM

## 2018-06-18 DIAGNOSIS — R059 Cough, unspecified: Secondary | ICD-10-CM

## 2018-06-18 MED ORDER — PROMETHAZINE-DM 6.25-15 MG/5ML PO SYRP: 5.0000 mL | ORAL_SOLUTION | Freq: Four times a day (QID) | ORAL | 0 refills | Status: DC | PRN

## 2018-06-18 MED ORDER — ALBUTEROL SULFATE 108 (90 BASE) MCG/ACT IN AEPB: 2.0000 | INHALATION_SPRAY | RESPIRATORY_TRACT | 1 refills | Status: DC | PRN

## 2018-06-18 MED ORDER — DIAZEPAM 5 MG PO TABS: ORAL_TABLET | ORAL | 0 refills | Status: DC

## 2018-06-18 MED ORDER — ALBUTEROL SULFATE (2.5 MG/3ML) 0.083% IN NEBU
2.5000 mg | INHALATION_SOLUTION | Freq: Once | RESPIRATORY_TRACT | Status: AC
  Administered 2018-06-18: 2.5 mg via RESPIRATORY_TRACT

## 2018-06-18 MED ORDER — DOXYCYCLINE HYCLATE 100 MG PO TABS: 100.0000 mg | ORAL_TABLET | Freq: Two times a day (BID) | ORAL | 0 refills | Status: DC

## 2018-06-18 MED FILL — PROAIR RESPICLICK INHAL PWD: 108 (90 BAS | 16 days supply | Qty: 1 | Fill #0

## 2018-06-18 MED FILL — DOXYCYCLINE HYCLATE 100 MG: 100 | 10 days supply | Qty: 20 | Fill #0

## 2018-06-18 NOTE — Patient Instructions (Signed)
Follow up as needed or as scheduled START the Doxycycline twice daily- take w/ food Drink LOTS of fluids Use the Albuterol as needed for coughing fits Use Mucinex DM for daytime cough and the cough syrup for nights/weekends (may cause drowsiness) Make sure you are taking daily Claritin or Zyrtec Call with any questions or concerns Hang in there!!!

## 2018-06-18 NOTE — Progress Notes (Signed)
   Subjective:    Patient ID: [name], female    DOB: [birth year], 42 y.o.   MRN: [id number]  HPI URI- sxs have progressively worsened since 9/23.  Now w/ facial pain, nasal congestion, dry hacking cough.  No fevers.  Bilateral ear fullness.  No tooth pain but + maxillary sinus pain.  + HAs.   Review of Systems For ROS see HPI     Objective:   Physical Exam  Constitutional: She appears well-developed and well-nourished. No distress.  HENT:  Head: Normocephalic and atraumatic.  Right Ear: Tympanic membrane normal.  Left Ear: Tympanic membrane normal.  Nose: Mucosal edema and rhinorrhea present. Right sinus exhibits maxillary sinus tenderness and frontal sinus tenderness. Left sinus exhibits maxillary sinus tenderness and frontal sinus tenderness.  Mouth/Throat: Uvula is midline and mucous membranes are normal. Posterior oropharyngeal erythema present. No oropharyngeal exudate.  Eyes: Pupils are equal, round, and reactive to light. Conjunctivae and EOM are normal.  Neck: Normal range of motion. Neck supple.  Cardiovascular: Normal rate, regular rhythm and normal heart sounds.  Pulmonary/Chest: Effort normal and breath sounds normal. No respiratory distress. She has no wheezes.  Near continuous dry cough- improved s/p neb tx  Lymphadenopathy:    She has no cervical adenopathy.          Assessment & Plan:  Bacterial sinusitis- pt's sinuses are exquisitely TTP both frontal and maxillary and she has been sick for 4 weeks.  Start abx.  Reviewed supportive care and red flags that should prompt return.  Pt expressed understanding and is in agreement w/ plan.   Cough- dry w/ bronchospasm component.  Improved s/p neb tx.  Albuterol HFA prn, cough meds.  Reviewed supportive care and red flags that should prompt return.  Pt expressed understanding and is in agreement w/ plan.

## 2018-06-18 NOTE — Progress Notes (Signed)
Pre-procedure diazepam ordered for pre-operative anxiety.  

## 2018-06-23 ENCOUNTER — Ambulatory Visit: Payer: No Typology Code available for payment source | Admitting: Orthotics

## 2018-06-23 DIAGNOSIS — R0789 Other chest pain: Secondary | ICD-10-CM

## 2018-06-23 DIAGNOSIS — M779 Enthesopathy, unspecified: Secondary | ICD-10-CM

## 2018-06-23 DIAGNOSIS — R52 Pain, unspecified: Secondary | ICD-10-CM

## 2018-06-23 NOTE — Progress Notes (Signed)
Patient came in today to pick up custom made foot orthotics.  The goals were accomplished and the patient reported no dissatisfaction with said orthotics.  Patient was advised of breakin period and how to report any issues. 

## 2018-06-25 MED FILL — diazePAM 5 MG TABS: 5 | 1 days supply | Qty: 2 | Fill #0

## 2018-06-28 ENCOUNTER — Ambulatory Visit (INDEPENDENT_AMBULATORY_CARE_PROVIDER_SITE_OTHER): Payer: Self-pay

## 2018-06-28 ENCOUNTER — Ambulatory Visit (INDEPENDENT_AMBULATORY_CARE_PROVIDER_SITE_OTHER): Payer: No Typology Code available for payment source | Admitting: Physical Medicine and Rehabilitation

## 2018-06-28 VITALS — BP 127/90 | HR 83 | Temp 98.3°F

## 2018-06-28 DIAGNOSIS — M5412 Radiculopathy, cervical region: Secondary | ICD-10-CM | POA: Diagnosis not present

## 2018-06-28 DIAGNOSIS — M5010 Cervical disc disorder with radiculopathy, unspecified cervical region: Secondary | ICD-10-CM | POA: Diagnosis not present

## 2018-06-28 DIAGNOSIS — M7918 Myalgia, other site: Secondary | ICD-10-CM

## 2018-06-28 DIAGNOSIS — M47812 Spondylosis without myelopathy or radiculopathy, cervical region: Secondary | ICD-10-CM

## 2018-06-28 MED ORDER — METHYLPREDNISOLONE ACETATE 80 MG/ML IJ SUSP
80.0000 mg | Freq: Once | INTRAMUSCULAR | Status: AC
  Administered 2018-06-28: 80 mg

## 2018-06-28 NOTE — Patient Instructions (Signed)

## 2018-06-28 NOTE — Progress Notes (Signed)
C7-T1 [name] Berline Chough patient   Numeric Pain Rating Scale and Functional Assessment Average Pain 6-7 most days, 4-5 right now (post Valium)  In the last MONTH (on 0-10 scale) has pain interfered with the following?  1. General activity like being  able to carry out your everyday physical activities such as walking, climbing stairs, carrying groceries, or moving a chair?  Rating 5-6   +Driver, -BT, -Dye Allergies.

## 2018-07-01 ENCOUNTER — Ambulatory Visit: Payer: No Typology Code available for payment source | Admitting: Gastroenterology

## 2018-07-01 ENCOUNTER — Encounter: Payer: Self-pay | Admitting: Gastroenterology

## 2018-07-01 VITALS — BP 120/70 | HR 72 | Ht 65.0 in | Wt 184.0 lb

## 2018-07-01 DIAGNOSIS — K52832 Lymphocytic colitis: Secondary | ICD-10-CM

## 2018-07-01 DIAGNOSIS — K529 Noninfective gastroenteritis and colitis, unspecified: Secondary | ICD-10-CM | POA: Diagnosis not present

## 2018-07-01 NOTE — Patient Instructions (Signed)
If you are age 43 or older, your body mass index should be between 23-30. Your Body mass index is 30.62 kg/m. If this is out of the aforementioned range listed, please consider follow up with your Primary Care Provider.  If you are age 22 or younger, your body mass index should be between 19-25. Your Body mass index is 30.62 kg/m. If this is out of the aformentioned range listed, please consider follow up with your Primary Care Provider.   Send [place] a my chart message in a few weeks with an update.   It was a pleasure to see you today!  Dr. Myrtie Neither

## 2018-07-01 NOTE — Progress Notes (Signed)
[name]  Chief Complaint: Lymphocytic colitis  Subjective  History:  [name] follows up for her chronic diarrhea.  Symptoms seem to most consistent with IBS, however random colon biopsies returned showing lymphocytic colitis.  I had initially prescribed hyoscyamine, then asked her to switch to budesonide.  Celiac sprue lab testing was negative.  She had not yet started the budesonide because she was concerned about the fact that it is a steroid, and she has had some weight gain.  It is remained about the same as before, with a loose stool most mornings, followed by 1 or 2 more later in the day, more so if she has anxiety.  She denies rectal bleeding, appetite has been good and weight stable.  ROS: Cardiovascular:  no chest pain Respiratory: no dyspnea  The patient's Past Medical, Family and Social History were reviewed and are on file in the EMR.  Objective:  Med list reviewed  Current Outpatient Medications:  .  acetaminophen (TYLENOL) 500 MG tablet, acetaminophen 500 mg tablet   1000 mg as needed by oral route., Disp: , Rfl:  .  Albuterol Sulfate (PROAIR RESPICLICK) 108 (90 Base) MCG/ACT AEPB, Inhale 2 puffs into the lungs every 4 (four) hours as needed., Disp: 1 each, Rfl: 1 .  budesonide (ENTOCORT EC) 3 MG 24 hr capsule, Take 3 capsules (9 mg total) by mouth daily. (Patient not taking: Reported on 06/28/2018), Disp: 90 capsule, Rfl: 2 .  celecoxib (CELEBREX) 200 MG capsule, Take 1 capsule (200 mg total) by mouth daily., Disp: 30 capsule, Rfl: 2 .  cetirizine (ZYRTEC) 10 MG tablet, Take 1 tablet (10 mg total) by mouth daily., Disp: 30 tablet, Rfl: 11 .  cloNIDine (CATAPRES) 0.1 MG tablet, Take 1 tablet (0.1 mg total) by mouth 2 (two) times daily as needed. For increased leg swelling and bp > 160/110, Disp: 30 tablet, Rfl: 0 .  cyclobenzaprine (FLEXERIL) 10 MG tablet, Take 1 tablet (10 mg total) by mouth 3 (three) times daily as needed for muscle  spasms., Disp: 30 tablet, Rfl: 1 .  diazepam (VALIUM) 5 MG tablet, Take 1 tablet (5 mg total) by mouth every 8 (eight) hours as needed for anxiety or muscle spasms., Disp: 30 tablet, Rfl: 1 .  diazepam (VALIUM) 5 MG tablet, Take 1 by mouth 1 hour  pre-procedure with very light food. May bring 2nd tablet to appointment., Disp: 2 tablet, Rfl: 0 .  diclofenac sodium (VOLTAREN) 1 % GEL, Apply 4 g topically 4 (four) times daily., Disp: 100 g, Rfl: 3 .  Glucosamine-Chondroitin 1500-1200 MG/30ML LIQD, Glucosamine Chondroitin, Disp: , Rfl:  .  hyoscyamine (LEVSIN SL) 0.125 MG SL tablet, Place 1 tablet (0.125 mg total) under the tongue every 6 (six) hours as needed., Disp: 45 tablet, Rfl: 1 .  losartan-hydrochlorothiazide (HYZAAR) 100-25 MG tablet, Take 1 tablet by mouth daily., Disp: 90 tablet, Rfl: 3 .  Menthol-Camphor (TIGER BALM ARTHRITIS RUB EX), Apply topically., Disp: , Rfl:  .  Probiotic Product (PROBIOTIC-10 PO), Take 1 capsule by mouth daily., Disp: , Rfl:  .  Turmeric 1053 MG TABS, Take 1 tablet by mouth 2 (two) times daily., Disp: , Rfl:    Vital signs in last 24 hrs: Vitals:   07/01/18 1043  BP: 120/70  Pulse: 72    Physical Exam    Cardiac: RRR without murmurs, S1S2 heard, no peripheral edema  Pulm: clear to auscultation bilaterally, normal RR and effort noted  Abdomen: soft, no tenderness, with  active bowel sounds. No guarding or palpable hepatosplenomegaly.   @ASSESSMENTPLANBEGIN @ Assessment: Encounter Diagnoses  Name Primary?  . Chronic diarrhea Yes  . 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  I advised her to continue the hyoscyamine as needed if it has been helpful.  I would like her to try the budesonide, since of the available meds, it seems to work the best for this condition.  I also reassured her that it has 99% first-pass metabolism, therefore very low chance of steroid related side effects.  Recommended she to take it for at  least the next 3 or 4 weeks, then send me a message about how she is feeling.  Often times, if we can get symptoms under control with it, I will wean it off after a couple of months.  If she is not tolerating it, or it does not seem to work very well, we can switch to a mesalamine preparation.   Total time 15 minutes, over half spent face-to-face with patient in counseling and coordination of care.   Charlie Pitter III

## 2018-07-07 ENCOUNTER — Encounter (INDEPENDENT_AMBULATORY_CARE_PROVIDER_SITE_OTHER): Payer: Self-pay | Admitting: Physical Medicine and Rehabilitation

## 2018-07-07 NOTE — Progress Notes (Signed)
[name] - 42 y.o. female MRN [id number]  Date of birth: September 01, 1976  Office Visit Note: Visit Date: 06/28/2018 PCP: Sheliah Hatch, MD Referred by: Sheliah Hatch, MD  Subjective: Chief Complaint  Patient presents with  . Neck - Pain  . Right Shoulder - Pain  . Left Shoulder - Pain  . Head - Pain   HPI: [name] is a 42 y.o. female who comes in today At the request of Dr. Gaspar Bidding for evaluation management of her neck pain.  She reports worsening severe mostly posterior axial neck pain some referral to the shoulders that began sometime in June without any known inciting injury or incident.  She had treated this with Goody's headache powders as well as history of Toradol injection with temporary relief.  She did use some Tiger balm.  She ultimately started seeing Dr. Berline Chough and he provided good conservative care with activity modification and exercise and mechanical treatment as well as change of medications.  She has had physical therapy etc. without much relief.  At his last visit with her at the end of September he ordered MRI of the cervical spine.  This was reviewed with her today using the images as well as spine models.  She has had no prior cervical surgery or interventional spine procedures.  She does use Valium at night.  And that does seem to relieve a lot of her symptoms at night.  She does report associated headaches posteriorly.  She denies any numbness tingling in the hands but feels like she does get some tremors and shaking in the hands at times but randomly.  She has had no changes in bowel or bladder function or red flag symptoms.  No fevers chills or night sweats.  She has not had any changes in balance issues.  She denies any focal weakness.  Review of Systems  Constitutional: Negative for chills, fever, malaise/fatigue and weight loss.  HENT: Negative for hearing loss and sinus pain.   Eyes: Negative for blurred vision, double vision and photophobia.   Respiratory: Negative for cough and shortness of breath.   Cardiovascular: Negative for chest pain, palpitations and leg swelling.  Gastrointestinal: Negative for abdominal pain, nausea and vomiting.  Genitourinary: Negative for flank pain.  Musculoskeletal: Positive for joint pain and neck pain. Negative for myalgias.  Skin: Negative for itching and rash.  Neurological: Positive for tremors. Negative for focal weakness and weakness.  Endo/Heme/Allergies: Negative.   Psychiatric/Behavioral: Negative for depression.  All other systems reviewed and are negative.  Otherwise per HPI.  Assessment & Plan: Visit Diagnoses:  1. Cervical radiculopathy   2. Cervical disc disorder with radiculopathy   3. Cervical spondylosis without myelopathy   4. Myofascial pain syndrome     Plan: Findings:  Chronic worsening severe cervicalgia mostly occipital with some associated type headache and some referral to the shoulders without any radicular pain down the arms.  MRI shows central protrusion at C3-4 with facet arthropathy and some foraminal narrowing.  Rest of the spine looks fairly good.  She is failed good conservative care through Dr. Berline Chough at this point he continues with exercises and use of Valium which does help at night.  I think her symptoms are multifactorial and are basically related to the disc protrusion and the facet arthropathy.  She may be getting cervicogenic type headaches from the arthritis at that level.  This level of protrusion and foraminal narrowing would not cause any radicular symptoms down the arms and  she is not really getting any of that.  She clearly has some myofascial pain as well which is why the Valium tends to help at night.  She likely would do well with management of that with dry needling and some type of training to help with stress and tension.  I do think it is appropriate to try a cervical epidural injection as this may be sort of the culprit underlying the other 2  problems.  Depending on relief would consider C3-4 facet joint blocks.  Could look ultimately at radiofrequency ablation at that level.  This was briefly discussed with her.  She will continue to follow with Dr. Berline Chough.    Meds & Orders:  Meds ordered this encounter  Medications  . methylPREDNISolone acetate (DEPO-MEDROL) injection 80 mg    Orders Placed This Encounter  Procedures  . XR C-ARM NO REPORT  . Epidural Steroid injection    Follow-up: Return if symptoms worsen or fail to improve.   Procedures: No procedures performed  Cervical Epidural Steroid Injection - Interlaminar Approach with Fluoroscopic Guidance  Patient: [name]      Date of Birth: [birth year] MRN: [id number] PCP: Sheliah Hatch, MD      Visit Date: 06/28/2018   Universal Protocol:    Date/Time: 11/06/196:27 AM  Consent Given By: the patient  Position: PRONE  Additional Comments: Vital signs were monitored before and after the procedure. Patient was prepped and draped in the usual sterile fashion. The correct patient, procedure, and site was verified.   Injection Procedure Details:  Procedure Site One Meds Administered:  Meds ordered this encounter  Medications  . methylPREDNISolone acetate (DEPO-MEDROL) injection 80 mg     Laterality: Bilateral  Location/Site: C7-T1  Needle size: 20 G  Needle type: Touhy  Needle Placement: Paramedian epidural space  Findings:  -Comments: Excellent flow of contrast into the epidural space.  Procedure Details: Using a paramedian approach from the side mentioned above, the region overlying the inferior lamina was localized under fluoroscopic visualization and the soft tissues overlying this structure were infiltrated with 4 ml. of 1% Lidocaine without Epinephrine. A # 20 gauge, Tuohy needle was inserted into the epidural space using a paramedian approach.  The epidural space was localized using loss of resistance along with lateral and  contralateral oblique bi-planar fluoroscopic views.  After negative aspirate for air, blood, and CSF, a 2 ml. volume of Isovue-250 was injected into the epidural space and the flow of contrast was observed. Radiographs were obtained for documentation purposes.   The injectate was administered into the level noted above.  Additional Comments:  The patient tolerated the procedure well Dressing: Band-Aid    Post-procedure details: Patient was observed during the procedure. Post-procedure instructions were reviewed.  Patient left the clinic in stable condition.    Clinical History: MRI CERVICAL SPINE WITHOUT CONTRAST  TECHNIQUE: Multiplanar, multisequence MR imaging of the cervical spine was performed. No intravenous contrast was administered.  COMPARISON:  None.  FINDINGS: The patient was unable to remain motionless for the exam. Small or subtle lesions could be overlooked.  Alignment: Anatomic  Vertebrae: No fracture, evidence of discitis, or bone lesion.  Cord: Normal signal and morphology. No significant stenosis or compression.  Posterior Fossa, vertebral arteries, paraspinal tissues: Negative.  Disc levels:  C2-3:  Normal.  C3-4: Central protrusion with osseous spurring. Effacement anterior subarachnoid space. RIGHT greater than LEFT C4 foraminal narrowing is compounded by facet arthropathy.  C4-5:  Tiny central protrusion.  No impingement.  C5-6:  Slight disc desiccation.  Facet arthropathy.  No impingement.  C6-7:  Normal interspace.  C7-T1:  Mild facet arthropathy.  Normal disc space.  No impingement.  IMPRESSION: Mild multilevel spondylosis. Central protrusion with osseous spurring at C3-4 in conjunction with facet arthropathy may be the symptomatic level. BILATERAL C4 foraminal narrowing is observed.   Electronically Signed   By: Elsie Stain M.D.   On: 05/26/2018 14:58   She reports that she has never smoked. She has never used  smokeless tobacco. No results for input(s): HGBA1C, LABURIC in the last 8760 hours.  Objective:  VS:  HT:    WT:   BMI:     BP:127/90  HR:83bpm  TEMP:98.3 F (36.8 C)( )  RESP:100 % Physical Exam  Constitutional: She is oriented to person, place, and time. She appears well-developed and well-nourished. No distress.  HENT:  Head: Normocephalic and atraumatic.  Nose: Nose normal.  Mouth/Throat: Oropharynx is clear and moist.  Eyes: Pupils are equal, round, and reactive to light. Conjunctivae are normal.  Neck: Neck supple. No JVD present. No tracheal deviation present.  Cardiovascular: Regular rhythm and intact distal pulses.  Pulmonary/Chest: Effort normal. No respiratory distress.  Abdominal: She exhibits no distension. There is no guarding.  Musculoskeletal:  Evaluation of the cervical spine shows focal trigger points in the paraspinal musculature as well as levator scapula bilaterally.  She does have pain with rotation of the cervical spine both left and right at end ranges.  Some pain with extension.  Some pain with forward flexion.  She has no shoulder impingement signs.  She has good strength in the upper extremities with elbow flexion extension shoulder abduction as well as long finger flexion and finger abduction and wrist extension.  She has a negative Hoffman's test bilaterally.  She has negative Spurling's test.  Neurological: She is alert and oriented to person, place, and time. She exhibits normal muscle tone. Coordination normal.  Skin: Skin is warm. No rash noted. No erythema.  Psychiatric: She has a normal mood and affect. Her behavior is normal.  Nursing note and vitals reviewed.   Ortho Exam Imaging: No results found.  Past Medical/Family/Surgical/Social History: Medications & Allergies reviewed per EMR, new medications updated. Patient Active Problem List   Diagnosis Date Noted  . Anxiety state 04/29/2016  . Atypical chest pain 02/22/2016  . Dysphagia,  pharyngoesophageal phase 02/22/2016  . Physical exam 12/15/2014  . Left hip pain 09/29/2014  . Radicular low back pain 09/11/2014  . Bilateral hip bursitis 09/11/2014  . BPV (benign positional vertigo) 02/22/2014  . HTN (hypertension) 02/22/2014   Past Medical History:  Diagnosis Date  . Arthritis    right foot  . Asthma   . Congenital flat foot    has had bilateral foot surgery to correct  . Frequent headaches   . GERD (gastroesophageal reflux disease)   . Hypertension   . Irritable bowel syndrome (IBS)    no current med.  . Painful orthopaedic hardware [facility]) 03/2013   right foot   Family History  Problem Relation Age of Onset  . Hypertension Mother   . Hypertension Father   . Diabetes Father   . Irritable bowel syndrome Father   . Breast cancer Paternal Grandmother   . Colon cancer Cousin        maternal  . Rectal cancer Neg Hx   . Esophageal cancer Neg Hx   . Stomach cancer Neg Hx    Past Surgical History:  Procedure  Laterality Date  . CALCANEAL OSTEOTOMY Right 12/23/2012   Procedure: RIGHT CALCANEAL OSTEOTOMY, RIGHT LATERAL COLUMN LENGTHENING, RIGHT COTTON OSTEOTOMY, RIGHT FLEX DIGITORIUM LONGUS TRANSFER, RIGHT KIDNER PROCEDURE ;  Surgeon: Toni Arthurs, MD;  Location: [facility];  Service: Orthopedics;  Laterality: Right;  . CESAREAN SECTION  07/13/2005; 06/03/2006  . DILATION AND EVACUATION  08/16/2008  . FOOT SURGERY Left 2002  . FOOT SURGERY Right   . GASTROCNEMIUS RECESSION Right 12/23/2012   Procedure: RIGHT GASTRO RECESSION ;  Surgeon: Toni Arthurs, MD;  Location: [facility];  Service: Orthopedics;  Laterality: Right;  . HARDWARE REMOVAL Right 03/24/2013   Procedure: HARDWARE REMOVAL RIGHT HEEL;  Surgeon: Toni Arthurs, MD;  Location: [facility];  Service: Orthopedics;  Laterality: Right;  . INTRAUTERINE DEVICE INSERTION    . WISDOM TOOTH EXTRACTION     Social History   Occupational History  . Occupation: Engineering geologist: [employer]  Tobacco Use  . Smoking status: Never Smoker  . Smokeless tobacco: Never Used  Substance and Sexual Activity  . Alcohol use: No  . Drug use: No  . Sexual activity: Yes    Birth control/protection: IUD

## 2018-07-07 NOTE — Procedures (Signed)
Cervical Epidural Steroid Injection - Interlaminar Approach with Fluoroscopic Guidance  Patient: [name]      Date of Birth: 1976/01/11 MRN: [id number] PCP: Sheliah Hatch, MD      Visit Date: 06/28/2018   Universal Protocol:    Date/Time: 11/06/196:27 AM  Consent Given By: the patient  Position: PRONE  Additional Comments: Vital signs were monitored before and after the procedure. Patient was prepped and draped in the usual sterile fashion. The correct patient, procedure, and site was verified.   Injection Procedure Details:  Procedure Site One Meds Administered:  Meds ordered this encounter  Medications  . methylPREDNISolone acetate (DEPO-MEDROL) injection 80 mg     Laterality: Bilateral  Location/Site: C7-T1  Needle size: 20 G  Needle type: Touhy  Needle Placement: Paramedian epidural space  Findings:  -Comments: Excellent flow of contrast into the epidural space.  Procedure Details: Using a paramedian approach from the side mentioned above, the region overlying the inferior lamina was localized under fluoroscopic visualization and the soft tissues overlying this structure were infiltrated with 4 ml. of 1% Lidocaine without Epinephrine. A # 20 gauge, Tuohy needle was inserted into the epidural space using a paramedian approach.  The epidural space was localized using loss of resistance along with lateral and contralateral oblique bi-planar fluoroscopic views.  After negative aspirate for air, blood, and CSF, a 2 ml. volume of Isovue-250 was injected into the epidural space and the flow of contrast was observed. Radiographs were obtained for documentation purposes.   The injectate was administered into the level noted above.  Additional Comments:  The patient tolerated the procedure well Dressing: Band-Aid    Post-procedure details: Patient was observed during the procedure. Post-procedure instructions were reviewed.  Patient left the clinic in  stable condition.

## 2018-07-11 ENCOUNTER — Encounter: Payer: Self-pay | Admitting: Sports Medicine

## 2018-07-11 DIAGNOSIS — M47812 Spondylosis without myelopathy or radiculopathy, cervical region: Secondary | ICD-10-CM | POA: Insufficient documentation

## 2018-07-11 NOTE — Assessment & Plan Note (Signed)
Referral for epidural steroid injection placed today.    >50% of this 25 minute visit spent in direct patient counseling and/or coordination of care.  Discussion was focused on education regarding the in discussing the pathoetiology and anticipated clinical course of the above condition.

## 2018-07-14 ENCOUNTER — Other Ambulatory Visit (INDEPENDENT_AMBULATORY_CARE_PROVIDER_SITE_OTHER): Payer: Self-pay | Admitting: Physical Medicine and Rehabilitation

## 2018-07-14 ENCOUNTER — Other Ambulatory Visit: Payer: Self-pay | Admitting: Family Medicine

## 2018-07-14 ENCOUNTER — Other Ambulatory Visit: Payer: Self-pay | Admitting: Sports Medicine

## 2018-07-14 MED FILL — OSCIMIN SL 0.125 MG TABLET: 0.125 | 11 days supply | Qty: 45 | Fill #1

## 2018-07-14 NOTE — Telephone Encounter (Signed)
Last OV 05/28/18, last refill 05/14/18 #30/1.

## 2018-07-15 NOTE — Telephone Encounter (Signed)
Refilled in PCP's absence. Next refill per PCP. Do not see recent HTN visit.

## 2018-07-20 ENCOUNTER — Encounter: Payer: Self-pay | Admitting: Family Medicine

## 2018-07-21 MED ORDER — LOSARTAN POTASSIUM-HCTZ 100-25 MG PO TABS: 1.0000 | ORAL_TABLET | Freq: Every day | ORAL | 0 refills | Status: DC

## 2018-07-21 MED FILL — LOSARTAN-HCTZ 100-25 MG TAB: 100-25 | 30 days supply | Qty: 30 | Fill #0

## 2018-07-23 ENCOUNTER — Ambulatory Visit: Payer: No Typology Code available for payment source | Admitting: Sports Medicine

## 2018-08-02 ENCOUNTER — Ambulatory Visit: Payer: No Typology Code available for payment source | Admitting: Sports Medicine

## 2018-08-11 ENCOUNTER — Encounter: Payer: Self-pay | Admitting: Family Medicine

## 2018-08-11 ENCOUNTER — Ambulatory Visit (INDEPENDENT_AMBULATORY_CARE_PROVIDER_SITE_OTHER): Payer: No Typology Code available for payment source | Admitting: Physician Assistant

## 2018-08-11 ENCOUNTER — Other Ambulatory Visit: Payer: Self-pay

## 2018-08-11 ENCOUNTER — Encounter: Payer: Self-pay | Admitting: Physician Assistant

## 2018-08-11 VITALS — BP 110/80 | HR 68 | Temp 98.4°F | Resp 16 | Ht 65.0 in | Wt 190.0 lb

## 2018-08-11 DIAGNOSIS — J9801 Acute bronchospasm: Secondary | ICD-10-CM | POA: Diagnosis not present

## 2018-08-11 MED ORDER — BECLOMETHASONE DIPROP HFA 40 MCG/ACT IN AERB: 1.0000 | INHALATION_SPRAY | Freq: Two times a day (BID) | RESPIRATORY_TRACT | 0 refills | Status: DC

## 2018-08-11 MED ORDER — IPRATROPIUM-ALBUTEROL 0.5-2.5 (3) MG/3ML IN SOLN
3.0000 mL | Freq: Once | RESPIRATORY_TRACT | Status: AC
  Administered 2018-08-11: 3 mL via RESPIRATORY_TRACT

## 2018-08-11 MED ORDER — PREDNISONE 20 MG PO TABS: 40.0000 mg | ORAL_TABLET | Freq: Every day | ORAL | 0 refills | Status: DC

## 2018-08-11 MED ORDER — BENZONATATE 100 MG PO CAPS: 100.0000 mg | ORAL_CAPSULE | Freq: Three times a day (TID) | ORAL | 0 refills | Status: DC | PRN

## 2018-08-11 MED FILL — BENZONATATE 100 MG CAP: 100 | 10 days supply | Qty: 30 | Fill #0

## 2018-08-11 MED FILL — predniSONE 20 MG TABS: 20 | 5 days supply | Qty: 10 | Fill #0

## 2018-08-11 MED FILL — QVAR REDIHALER 40 MCG/ACT A: 40 | 30 days supply | Qty: 11 | Fill #0

## 2018-08-11 NOTE — Progress Notes (Signed)
Patient presents to clinic today c/o a few weeks of a dry hacking cough that is causing SOB. Notes chest tigntess and some wheezing despite albuterol. Denies fever, chills, sinus pressure/pain, chest pain.    Past Medical History:  Diagnosis Date  . Arthritis    right foot  . Asthma   . Congenital flat foot    has had bilateral foot surgery to correct  . Frequent headaches   . GERD (gastroesophageal reflux disease)   . Hypertension   . Irritable bowel syndrome (IBS)    no current med.  . Painful orthopaedic hardware [facility]) 03/2013   right foot    Current Outpatient Medications on File Prior to Visit  Medication Sig Dispense Refill  . acetaminophen (TYLENOL) 500 MG tablet acetaminophen 500 mg tablet   1000 mg as needed by oral route.    . Albuterol Sulfate (PROAIR RESPICLICK) 108 (90 Base) MCG/ACT AEPB Inhale 2 puffs into the lungs every 4 (four) hours as needed. 1 each 1  . diclofenac sodium (VOLTAREN) 1 % GEL Apply 4 g topically 4 (four) times daily. 100 g 3  . Glucosamine-Chondroitin 1500-1200 MG/30ML LIQD Glucosamine Chondroitin    . hyoscyamine (LEVSIN SL) 0.125 MG SL tablet Place 1 tablet (0.125 mg total) under the tongue every 6 (six) hours as needed. 45 tablet 1  . losartan-hydrochlorothiazide (HYZAAR) 100-25 MG tablet Take 1 tablet by mouth daily. 90 tablet 0  . Menthol-Camphor (TIGER BALM ARTHRITIS RUB EX) Apply topically.    . Probiotic Product (PROBIOTIC-10 PO) Take 1 capsule by mouth daily.    . Turmeric 1053 MG TABS Take 1 tablet by mouth 2 (two) times daily.    . budesonide (ENTOCORT EC) 3 MG 24 hr capsule Take 3 capsules (9 mg total) by mouth daily. (Patient not taking: Reported on 06/28/2018) 90 capsule 2  . celecoxib (CELEBREX) 200 MG capsule Take 1 capsule (200 mg total) by mouth daily. (Patient not taking: Reported on 08/11/2018) 30 capsule 2  . cetirizine (ZYRTEC) 10 MG tablet Take 1 tablet (10 mg total) by mouth daily. (Patient not taking: Reported on  08/11/2018) 30 tablet 11  . cloNIDine (CATAPRES) 0.1 MG tablet Take 1 tablet (0.1 mg total) by mouth 2 (two) times daily as needed. For increased leg swelling and bp > 160/110 (Patient not taking: Reported on 08/11/2018) 30 tablet 0   No current facility-administered medications on file prior to visit.     Allergies  Allergen Reactions  . Augmentin [Amoxicillin-Pot Clavulanate] Nausea And Vomiting  . Biotin Other (See Comments)    Severe yeast infection  . Sulfa Antibiotics Other (See Comments)    Reaction:  Muscle pain     Family History  Problem Relation Age of Onset  . Hypertension Mother   . Hypertension Father   . Diabetes Father   . Irritable bowel syndrome Father   . Breast cancer Paternal Grandmother   . Colon cancer Cousin        maternal  . Rectal cancer Neg Hx   . Esophageal cancer Neg Hx   . Stomach cancer Neg Hx     Social History   Socioeconomic History  . Marital status: Married    Spouse name: Not on file  . Number of children: 3  . Years of education: Not on file  . Highest education level: Not on file  Occupational History  . Occupation: Teacher, adult education: [employer]  Social Needs  . Financial resource strain: Not on  file  . Food insecurity:    Worry: Not on file    Inability: Not on file  . Transportation needs:    Medical: Not on file    Non-medical: Not on file  Tobacco Use  . Smoking status: Never Smoker  . Smokeless tobacco: Never Used  Substance and Sexual Activity  . Alcohol use: No  . Drug use: No  . Sexual activity: Yes    Birth control/protection: IUD  Lifestyle  . Physical activity:    Days per week: Not on file    Minutes per session: Not on file  . Stress: Not on file  Relationships  . Social connections:    Talks on phone: Not on file    Gets together: Not on file    Attends religious service: Not on file    Active member of club or organization: Not on file    Attends meetings of clubs or organizations: Not on file     Relationship status: Not on file  Other Topics Concern  . Not on file  Social History Narrative  . Not on file   Review of Systems - See HPI.  All other ROS are negative.  BP 110/80   Pulse 68   Temp 98.4 F (36.9 C) (Oral)   Resp 16   Ht 5\' 5"  (1.651 m)   Wt 190 lb (86.2 kg)   SpO2 99%   BMI 31.62 kg/m   Physical Exam  Constitutional: She is oriented to person, place, and time. She appears well-developed and well-nourished.  HENT:  Head: Normocephalic and atraumatic.  Right Ear: External ear normal.  Left Ear: External ear normal.  Mouth/Throat: Oropharynx is clear and moist.  Eyes: Conjunctivae are normal.  Neck: Neck supple.  Cardiovascular: Normal rate, regular rhythm, normal heart sounds and intact distal pulses.  Pulmonary/Chest: Effort normal and breath sounds normal. No stridor. No respiratory distress. She has no wheezes. She has no rales. She exhibits no tenderness.  Neurological: She is alert and oriented to person, place, and time.  Vitals reviewed.  Recent Results (from the past 2160 hour(s))  Tissue transglutaminase, IgA     Status: None   Collection Time: 05/21/18  3:34 PM  Result Value Ref Range   (tTG) Ab, IgA 3 U/mL    Comment: .        Value      Interpretation        -----      --------------        <4         No Antibody Detected        > or = 4   Antibody Detected .   IgA     Status: None   Collection Time: 05/21/18  3:34 PM  Result Value Ref Range   IgA 175 68 - 378 mg/dL    Comment:    CBC with Differential/Platelet     Status: None   Collection Time: 05/21/18  3:34 PM  Result Value Ref Range   WBC 4.9 4.0 - 10.5 K/uL   RBC 4.58 3.87 - 5.11 Mil/uL   Hemoglobin 13.4 12.0 - 15.0 g/dL   HCT 16.1 09.6 - 04.5 %   MCV 86.4 78.0 - 100.0 fl   MCHC 33.9 30.0 - 36.0 g/dL   RDW 40.9 81.1 - 91.4 %   Platelets 327.0 150.0 - 400.0 K/uL   Neutrophils Relative % 66.3 43.0 - 77.0 %   Lymphocytes Relative 22.3 12.0 -  46.0 %   Monocytes Relative 7.3  3.0 - 12.0 %   Eosinophils Relative 2.8 0.0 - 5.0 %   Basophils Relative 1.3 0.0 - 3.0 %   Neutro Abs 3.2 1.4 - 7.7 K/uL   Lymphs Abs 1.1 0.7 - 4.0 K/uL   Monocytes Absolute 0.4 0.1 - 1.0 K/uL   Eosinophils Absolute 0.1 0.0 - 0.7 K/uL   Basophils Absolute 0.1 0.0 - 0.1 K/uL    Assessment/Plan: 1. Bronchospasm Duoneb given in office with improvement. Start Qvar 1-2 puffs BID. Rx Prednisone 40 mg for 5 day burst. Tessalon per orders. Supportive measures and OTC medications reviewed. Close follow-up scheduled.  - beclomethasone (QVAR REDIHALER) 40 MCG/ACT inhaler; Inhale 1-2 puffs into the lungs 2 (two) times daily.  Dispense: 10.6 g; Refill: 0 - predniSONE (DELTASONE) 20 MG tablet; Take 2 tablets (40 mg total) by mouth daily with breakfast.  Dispense: 10 tablet; Refill: 0 - benzonatate (TESSALON) 100 MG capsule; Take 1 capsule (100 mg total) by mouth 3 (three) times daily as needed for cough.  Dispense: 30 capsule; Refill: 0 - ipratropium-albuterol (DUONEB) 0.5-2.5 (3) MG/3ML nebulizer solution 3 mL   Piedad ClimesWilliam Cody Layson Bertsch, PA-C

## 2018-08-11 NOTE — Patient Instructions (Signed)
Please keep well-hydrated and get plenty of rest. Start a daily Claritin and OTC Flonase to help with allergic inflammation. Take the steroid burst as directed for the next 5 days. Also start the Qvar.  Albuterol as directed if needed.  Follow-up in 1 week for reassessment.   I did send in a cough medication for you to have on hand.

## 2018-08-17 ENCOUNTER — Encounter: Payer: Self-pay | Admitting: Family Medicine

## 2018-08-17 ENCOUNTER — Ambulatory Visit (HOSPITAL_COMMUNITY)
Admission: RE | Admit: 2018-08-17 | Discharge: 2018-08-17 | Disposition: A | Discharge status: Home / Self Care | Payer: No Typology Code available for payment source | Source: Ambulatory Visit | Attending: Physician Assistant | Admitting: Physician Assistant

## 2018-08-17 DIAGNOSIS — R05 Cough: Secondary | ICD-10-CM | POA: Diagnosis present

## 2018-08-17 DIAGNOSIS — R053 Chronic cough: Secondary | ICD-10-CM

## 2018-08-17 NOTE — Addendum Note (Signed)
Addended by: Waldon MerlMARTIN, Jamorion Gomillion C on: 08/17/2018 11:29 AM   Modules accepted: Orders

## 2018-08-19 NOTE — Addendum Note (Signed)
Addended by: Waldon MerlMARTIN, Jailon Schaible C on: 08/19/2018 07:35 AM   Modules accepted: Orders

## 2018-09-27 ENCOUNTER — Encounter: Payer: Self-pay | Admitting: Podiatry

## 2018-09-27 ENCOUNTER — Encounter: Payer: Self-pay | Admitting: Pulmonary Disease

## 2018-09-27 ENCOUNTER — Ambulatory Visit (INDEPENDENT_AMBULATORY_CARE_PROVIDER_SITE_OTHER): Payer: No Typology Code available for payment source

## 2018-09-27 ENCOUNTER — Ambulatory Visit: Payer: No Typology Code available for payment source | Admitting: Podiatry

## 2018-09-27 ENCOUNTER — Ambulatory Visit (INDEPENDENT_AMBULATORY_CARE_PROVIDER_SITE_OTHER): Payer: No Typology Code available for payment source | Admitting: Pulmonary Disease

## 2018-09-27 ENCOUNTER — Other Ambulatory Visit: Payer: Self-pay | Admitting: Podiatry

## 2018-09-27 DIAGNOSIS — M205X2 Other deformities of toe(s) (acquired), left foot: Secondary | ICD-10-CM

## 2018-09-27 DIAGNOSIS — R05 Cough: Secondary | ICD-10-CM | POA: Insufficient documentation

## 2018-09-27 DIAGNOSIS — M779 Enthesopathy, unspecified: Secondary | ICD-10-CM | POA: Diagnosis not present

## 2018-09-27 DIAGNOSIS — M79672 Pain in left foot: Secondary | ICD-10-CM

## 2018-09-27 DIAGNOSIS — R053 Chronic cough: Secondary | ICD-10-CM

## 2018-09-27 MED ORDER — CHLORPHENIRAMINE MALEATE 4 MG PO TABS: 4.0000 mg | ORAL_TABLET | Freq: Two times a day (BID) | ORAL | 0 refills | Status: DC | PRN

## 2018-09-27 MED ORDER — TRIAMCINOLONE ACETONIDE 10 MG/ML IJ SUSP
10.0000 mg | Freq: Once | INTRAMUSCULAR | Status: AC
  Administered 2018-09-27: 10 mg

## 2018-09-27 NOTE — Patient Instructions (Addendum)
Pre-Operative Instructions  Congratulations, you have decided to take an important step towards improving your quality of life.  You can be assured that the doctors and staff at [facility] will be with you every step of the way.  Here are some important things you should know:  1. Plan to be at the surgery center/hospital at least 1 (one) hour prior to your scheduled time, unless otherwise directed by the surgical center/hospital staff.  You must have a responsible adult accompany you, remain during the surgery and drive you home.  Make sure you have directions to the surgical center/hospital to ensure you arrive on time. 2. If you are having surgery at [facility] or [facility], you will need a copy of your medical history and physical form from your family physician within one month prior to the date of surgery. We will give you a form for your primary physician to complete.  3. We make every effort to accommodate the date you request for surgery.  However, there are times where surgery dates or times have to be moved.  We will contact you as soon as possible if a change in schedule is required.   4. No aspirin/ibuprofen for one week before surgery.  If you are on aspirin, any non-steroidal anti-inflammatory medications (Mobic, Aleve, Ibuprofen) should not be taken seven (7) days prior to your surgery.  You make take Tylenol for pain prior to surgery.  5. Medications - If you are taking daily heart and blood pressure medications, seizure, reflux, allergy, asthma, anxiety, pain or diabetes medications, make sure you notify the surgery center/hospital before the day of surgery so they can tell you which medications you should take or avoid the day of surgery. 6. No food or drink after midnight the night before surgery unless directed otherwise by surgical center/hospital staff. 7. No alcoholic beverages 24-hours prior to surgery.  No smoking 24-hours prior or 24-hours after  surgery. 8. Wear loose pants or shorts. They should be loose enough to fit over bandages, boots, and casts. 9. Don't wear slip-on shoes. Sneakers are preferred. 10. Bring your boot with you to the surgery center/hospital.  Also bring crutches or a walker if your physician has prescribed it for you.  If you do not have this equipment, it will be provided for you after surgery. 11. If you have not been contacted by the surgery center/hospital by the day before your surgery, call to confirm the date and time of your surgery. 12. Leave-time from work may vary depending on the type of surgery you have.  Appropriate arrangements should be made prior to surgery with your employer. 13. Prescriptions will be provided immediately following surgery by your doctor.  Fill these as soon as possible after surgery and take the medication as directed. Pain medications will not be refilled on weekends and must be approved by the doctor. 14. Remove nail polish on the operative foot and avoid getting pedicures prior to surgery. 15. Wash the night before surgery.  The night before surgery wash the foot and leg well with water and the antibacterial soap provided. Be sure to pay special attention to beneath the toenails and in between the toes.  Wash for at least three (3) minutes. Rinse thoroughly with water and dry well with a towel.  Perform this wash unless told not to do so by your physician.  Enclosed: 1 Ice pack (please put in freezer the night before surgery)   1 Hibiclens skin cleaner     Pre-op instructions  If you have any questions regarding the instructions, please do not hesitate to call our office.  [place]: [address], [place], [place] [address] -- [phone]  [place]: [address], [place], [place] [address] -- [phone]  [place]: [address] [place] [address] -- [phone]  [place]: [address], [place], [place] [address] -- [phone]  Website:  [email or web address]   Hallux Rigidus  Hallux rigidus is a type of joint pain or joint disease (arthritis) that affects your big toe (hallux). This condition involves the joint that connects the base of your big toe to the main part of your foot (metatarsophalangeal joint). This condition can cause your big toe to become stiff, painful, and difficult to move. Symptoms may get worse with movement or in cold or damp weather. The condition also gets worse over time. What are the causes? This condition may be caused by having a foot that does not function the way that it should or has an abnormal shape (structural deformity). These foot problems can run in families (be hereditary). This condition can also be caused by: Injury. Overuse. Certain inflammatory diseases, including gout and rheumatoid arthritis. What increases the risk? This condition is more likely to develop in people who: Have a foot bone (metatarsal) that is longer or higher than normal. Have a family history of hallux rigidus. Have previously injured their big toe. Have feet that do not have a curve (arch) on the inner side of the foot. This may be called flat feet or fallen arches. Turn their ankles in when they walk (pronation). Have rheumatoid arthritis or gout. Have to stoop down often at work. What are the signs or symptoms? Symptoms of this condition include: Big toe pain. Stiffness and difficulty moving the big toe. Swelling of the toe and surrounding area. Bone spurs. These are bony growths that can form on the joint of the big toe. A limp. How is this diagnosed? This condition is diagnosed based on a medical history and physical exam. This may include X-rays. How is this treated? Treatment for this condition includes: Wearing roomy, comfortable shoes that have a large toe box. Putting orthotic devices in your shoes. Pain medicines. Physical therapy. Icing the injured area. Alternate between putting your  foot in cold water then warm water. If your condition is severe, treatment may include: Corticosteroid injections to relieve pain. Surgery to remove bone spurs, fuse damaged bones together, or replace the entire joint. Follow these instructions at home: Take over-the-counter and prescription medicines only as told by your health care provider. Do not wear high heels or other restrictive footwear. Wear comfortable, supportive shoes that have a large toe box. Wear orthotics as told by your health care provider, if this applies. Put your feet in cold water for 30 seconds, then in warm water for 30 seconds. Alternate between the cold and warm water for 5 minutes. Do this several times a day or as told by your health care provider. If directed, apply ice to the injured area. Put ice in a plastic bag. Place a towel between your skin and the bag. Leave the ice on for 20 minutes, 2-3 times per day. Do foot exercises as instructed by your health care provider or a physical therapist. Keep all follow-up visits as told by your health care provider. This is important. Contact a health care provider if: You notice bone spurs or growths on or around your big toe. Your pain does not get  better or it gets worse. You have pain while resting. You have pain in other parts of your body, such as your back, hip, or knee. You start to limp. This information is not intended to replace advice given to you by your health care provider. Make sure you discuss any questions you have with your health care provider. Document Released: 08/18/2005 Document Revised: 01/24/2016 Document Reviewed: 04/25/2015 Elsevier Interactive Patient Education  2019 [employer]

## 2018-09-27 NOTE — Progress Notes (Signed)
Subjective:    Patient ID: [name], female    DOB: 03-10-1976, 43 y.o.   MRN: [id number]  HPI  Chief Complaint  Patient presents with  . Pulm Consult    Referred by Marcelline Mates PA for a chronic cough. Stated that this has been going on since September 2019. Cough comes on randomally.     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 She reports an insidious onset of cough around September 2019 without preceding URI symptoms.  Since then she has a continuous dry cough that seems to occur in bouts, no diurnal or seasonal variation, no clear triggers other than talking or deep breaths.  Medications including Delsym over-the-counter cough syrup and benzonatate Perles have not really helped.  She had one office visit 07/2018 with wheezing where she was given duo nebs and prednisone steroid burst. She denies similar episodes in the past.  She denies significant exposures at work or home.  She lives with her husband and children aged12,13 & 21   She reports occasional postnasal drip for which she has been taking Zyrtec at bedtime since onset of symptoms.  About 2 years ago she was diagnosed with GERD when she developed a lump in her throat type symptoms, never had obvious heartburn or acid brash, this resolved with omeprazole and has not recurred.  She denies childhood history of asthma or significant wheezing  Hypertension is well controlled on one medication She has past medical history of lymphocytic colitis and chronic diarrhea and has stopped taking steroids    Past Medical History:  Diagnosis Date  . Arthritis    right foot  . Asthma   . Congenital flat foot    has had bilateral foot surgery to correct  . Frequent headaches   . GERD (gastroesophageal reflux disease)   . Hypertension   . Irritable bowel syndrome (IBS)    no current med.  . Painful orthopaedic hardware [facility]) 03/2013   right foot    Past  Surgical History:  Procedure Laterality Date  . CALCANEAL OSTEOTOMY Right 12/23/2012   Procedure: RIGHT CALCANEAL OSTEOTOMY, RIGHT LATERAL COLUMN LENGTHENING, RIGHT COTTON OSTEOTOMY, RIGHT FLEX DIGITORIUM LONGUS TRANSFER, RIGHT KIDNER PROCEDURE ;  Surgeon: Toni Arthurs, MD;  Location: [facility];  Service: Orthopedics;  Laterality: Right;  . CESAREAN SECTION  07/13/2005; 06/03/2006  . DILATION AND EVACUATION  08/16/2008  . FOOT SURGERY Left 2002  . FOOT SURGERY Right   . GASTROCNEMIUS RECESSION Right 12/23/2012   Procedure: RIGHT GASTRO RECESSION ;  Surgeon: Toni Arthurs, MD;  Location: [facility];  Service: Orthopedics;  Laterality: Right;  . HARDWARE REMOVAL Right 03/24/2013   Procedure: HARDWARE REMOVAL RIGHT HEEL;  Surgeon: Toni Arthurs, MD;  Location: [facility];  Service: Orthopedics;  Laterality: Right;  . INTRAUTERINE DEVICE INSERTION    . WISDOM TOOTH EXTRACTION      Allergies  Allergen Reactions  . Augmentin [Amoxicillin-Pot Clavulanate] Nausea And Vomiting  . Biotin Other (See Comments)    Severe yeast infection  . Sulfa Antibiotics Other (See Comments)    Reaction:  Muscle pain     Social History   Socioeconomic History  . Marital status: Married    Spouse name: Not on file  . Number of children: 3  . Years of education: Not on file  . Highest education level: Not on file  Occupational History  . Occupation: Teacher, adult education: [employer]  Social Needs  . Financial resource strain: Not on file  . Food insecurity:    Worry: Not on file    Inability: Not on file  . Transportation needs:    Medical: Not on file    Non-medical: Not on file  Tobacco Use  . Smoking status: Never Smoker  . Smokeless tobacco: Never Used  Substance and Sexual Activity  . Alcohol use: No  . Drug use: No  . Sexual activity: Yes    Birth control/protection: I.U.D.  Lifestyle  . Physical activity:    Days per week: Not on file    Minutes per  session: Not on file  . Stress: Not on file  Relationships  . Social connections:    Talks on phone: Not on file    Gets together: Not on file    Attends religious service: Not on file    Active member of club or organization: Not on file    Attends meetings of clubs or organizations: Not on file    Relationship status: Not on file  . Intimate partner violence:    Fear of current or ex partner: Not on file    Emotionally abused: Not on file    Physically abused: Not on file    Forced sexual activity: Not on file  Other Topics Concern  . Not on file  Social History Narrative  . Not on file     Family History  Problem Relation Age of Onset  . Hypertension Mother   . Hypertension Father   . Diabetes Father   . Irritable bowel syndrome Father   . Breast cancer Paternal Grandmother   . Colon cancer Cousin        maternal  . Rectal cancer Neg Hx   . Esophageal cancer Neg Hx   . Stomach cancer Neg Hx      Review of Systems Constitutional: negative for anorexia, fevers and sweats  Eyes: negative for irritation, redness and visual disturbance  Ears, nose, mouth, throat, and face: negative for earaches, epistaxis, nasal congestion and sore throat  Respiratory: negative for cough, dyspnea on exertion, sputum and wheezing  Cardiovascular: negative for chest pain, dyspnea, lower extremity edema, orthopnea, palpitations and syncope  Gastrointestinal: negative for abdominal pain, constipation, diarrhea, melena, nausea and vomiting  Genitourinary:negative for dysuria, frequency and hematuria  Hematologic/lymphatic: negative for bleeding, easy bruising and lymphadenopathy  Musculoskeletal:negative for arthralgias, muscle weakness and stiff joints  Neurological: negative for coordination problems, gait problems, headaches and weakness  Endocrine: negative for diabetic symptoms including polydipsia, polyuria and weight loss     Objective:   Physical Exam  Gen. Pleasant,  well-nourished, in no distress, normal affect ENT - no pallor,icterus, no post nasal drip Neck: No JVD, no thyromegaly, no carotid bruits Lungs: no use of accessory muscles, no dullness to percussion, clear without rales or rhonchi  Cardiovascular: Rhythm regular, heart sounds  normal, no murmurs or gallops, no peripheral edema Abdomen: soft and non-tender, no hepatosplenomegaly, BS normal. Musculoskeletal: No deformities, no cyanosis or clubbing Neuro:  alert, non focal       Assessment & Plan:

## 2018-09-27 NOTE — Patient Instructions (Addendum)
Your chronic cough could be related to postnasal drip or reflux.  At this time, sequential treatment is advised.  Trial of chlorpheniramine 4 mg at bedtime x 4 weeks With store brand Sudafed/phenylephrine 10 mg once daily example walphed   Call me in 3 to 4 weeks with results. If no or limited improvement, will undertake trial of Protonix 40 mg daily for 4 weeks  Consider testing only if you do not respond to these treatment trials

## 2018-09-27 NOTE — Assessment & Plan Note (Signed)
chronic cough could be related to postnasal drip or reflux.  At this time, sequential treatment is advised.  Trial of chlorpheniramine 4 mg at bedtime x 4 weeks With store brand Sudafed/phenylephrine 10 mg once daily example walphed   Call me in 3 to 4 weeks with results. If no or limited improvement, will undertake trial of Protonix 40 mg daily for 4 weeks  Consider testing only if she does not respond to these treatment trials

## 2018-09-28 NOTE — Progress Notes (Signed)
Subjective:   Patient ID: [name], female   DOB: 42 y.o.   MRN: [id number]   HPI Patient states that the left ankle is feeling some better and heel but that the big toe joint which is been a problem all along has gotten worse and more noticeable and she feels like she is losing her motion.  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  Patient wants surgery and after extensive review signed consent form understanding there is no guarantee that this will solve the problem and that ultimately she may require fusion or joint implantation procedure.  Patient wants surgery and at this time is given all preoperative instructions and understands total recovery from this type of procedure will be 6 months to 1 year.  I did dispense air fracture walker and I also discussed injection of the peroneal tendon group if it remains tender at the time of the surgery.

## 2018-10-02 HISTORY — PX: FOOT SURGERY: SHX648

## 2018-10-12 ENCOUNTER — Encounter: Payer: Self-pay | Admitting: Podiatry

## 2018-10-12 DIAGNOSIS — M2022 Hallux rigidus, left foot: Secondary | ICD-10-CM

## 2018-10-12 MED FILL — OXYCODONE-APAP 10-325: 10-325 | 4 days supply | Qty: 25 | Fill #0

## 2018-10-12 MED FILL — ONDANSETRON HCL 4 MG TABLET: 4 | 3 days supply | Qty: 15 | Fill #0

## 2018-10-22 ENCOUNTER — Other Ambulatory Visit: Payer: No Typology Code available for payment source

## 2018-10-25 ENCOUNTER — Ambulatory Visit (INDEPENDENT_AMBULATORY_CARE_PROVIDER_SITE_OTHER): Payer: No Typology Code available for payment source

## 2018-10-25 ENCOUNTER — Ambulatory Visit (INDEPENDENT_AMBULATORY_CARE_PROVIDER_SITE_OTHER): Payer: No Typology Code available for payment source | Admitting: Podiatry

## 2018-10-25 ENCOUNTER — Encounter: Payer: Self-pay | Admitting: Podiatry

## 2018-10-25 DIAGNOSIS — M205X2 Other deformities of toe(s) (acquired), left foot: Secondary | ICD-10-CM

## 2018-10-25 DIAGNOSIS — Z09 Encounter for follow-up examination after completed treatment for conditions other than malignant neoplasm: Secondary | ICD-10-CM

## 2018-10-25 NOTE — Progress Notes (Signed)
refe

## 2018-10-26 ENCOUNTER — Telehealth: Payer: Self-pay | Admitting: Podiatry

## 2018-10-26 NOTE — Telephone Encounter (Signed)
I was seen for my first postop yesterday and forgot to ask if I can get a temporary handicap placard to use as I'm returning to work on Monday, 02 March. Please call me back. Thank you.

## 2018-10-27 NOTE — Progress Notes (Signed)
Subjective:   Patient ID: [name], female   DOB: 43 y.o.   MRN: [id number]   HPI Patient presents stating overall doing well with mild to moderate discomfort when on the foot for too long of a time   ROS      Objective:  Physical Exam  Neurovascular status intact with excellent range of motion of the first MPJ left with fluid within the joint of a moderate nature and no crepitus of the joint noted currently with some restriction of motion which I would expect given the preoperative condition     Assessment:  Doing well post osteotomy first metatarsal left with moderate restriction consistent with this procedure     Plan:  H&P x-rays reviewed and scheduled for physical therapy to try to improve the motion of the joint.  Patient is explained as far as the x-rays go and sterile dressing reapplied along with continued immobilization elevation and home exercises to do also.  Reappoint for [place] to recheck in several weeks or earlier if needed  X-rays indicate osteotomies healing well good alignment noted joint congruence

## 2018-11-05 ENCOUNTER — Telehealth: Payer: Self-pay | Admitting: Podiatry

## 2018-11-05 DIAGNOSIS — Z09 Encounter for follow-up examination after completed treatment for conditions other than malignant neoplasm: Secondary | ICD-10-CM

## 2018-11-05 DIAGNOSIS — M205X2 Other deformities of toe(s) (acquired), left foot: Secondary | ICD-10-CM

## 2018-11-05 NOTE — Telephone Encounter (Signed)
Pt had surgery on 2.11.20 and is back at work this week and is noticing some swelling in her foot. Patient would like to know if there is anyway she can get a knee scooter to use while at work. Please give pt a call back.

## 2018-11-05 NOTE — Telephone Encounter (Signed)
Dr. Everlena Cooper order for the knee scooter. I informed pt, [facility] would call to discuss cost and delivery. Faxed orders to [facility] and emailed to [facility]. [place].

## 2018-11-05 NOTE — Addendum Note (Signed)
Addended by: Alphia Kava D on: 11/05/2018 10:45 AM   Modules accepted: Orders

## 2018-11-10 ENCOUNTER — Ambulatory Visit (INDEPENDENT_AMBULATORY_CARE_PROVIDER_SITE_OTHER): Payer: No Typology Code available for payment source | Admitting: Podiatry

## 2018-11-10 ENCOUNTER — Encounter: Payer: Self-pay | Admitting: Podiatry

## 2018-11-10 ENCOUNTER — Other Ambulatory Visit: Payer: Self-pay

## 2018-11-10 ENCOUNTER — Ambulatory Visit (INDEPENDENT_AMBULATORY_CARE_PROVIDER_SITE_OTHER): Payer: No Typology Code available for payment source

## 2018-11-10 DIAGNOSIS — Z09 Encounter for follow-up examination after completed treatment for conditions other than malignant neoplasm: Secondary | ICD-10-CM

## 2018-11-10 DIAGNOSIS — M205X2 Other deformities of toe(s) (acquired), left foot: Secondary | ICD-10-CM

## 2018-11-10 NOTE — Progress Notes (Signed)
Subjective:   Patient ID: [name], female   DOB: 43 y.o.   MRN: [id number]   HPI Patient states overall doing well but her foot swells when she is at work   ROS      Objective:  Physical Exam  Neurovascular status intact negative Homans sign noted with patient's left foot doing well overall with swelling if she does too much but overall quite a bit of improvement with good range of motion no crepitus of the joint     Assessment:  Overall doing well with good range of motion with physical therapy helping this quite a bit with edema consistent for this.  Postop     Plan:  X-ray reviewed continue increased activity but do it slowly over time and encourage range of motion exercises.  Patient will be seen back 4 weeks or earlier if needed will gradually return to soft shoe gear  X-ray indicates osteotomies healing well fixation in place joint congruence

## 2018-11-15 MED FILL — HYDROCHLOROTHIAZIDE 25 MG T: 25 | 60 days supply | Qty: 60 | Fill #0

## 2018-11-15 MED FILL — LOSARTAN POTASSIUM 100 MG T: 100 | 60 days supply | Qty: 60 | Fill #0

## 2018-11-22 ENCOUNTER — Telehealth: Payer: Self-pay | Admitting: Podiatry

## 2018-11-22 DIAGNOSIS — M79662 Pain in left lower leg: Secondary | ICD-10-CM

## 2018-11-22 DIAGNOSIS — R609 Edema, unspecified: Secondary | ICD-10-CM

## 2018-11-22 DIAGNOSIS — Z09 Encounter for follow-up examination after completed treatment for conditions other than malignant neoplasm: Secondary | ICD-10-CM

## 2018-11-22 NOTE — Telephone Encounter (Signed)
Pt states the calf is painful and swollen with walking and the foot is swollen even with compression. Left message for [facility] - [facility] to call to schedule pt today or early tomorrow for left venous doppler. Faxed orders to [facility].

## 2018-11-22 NOTE — Addendum Note (Signed)
Addended by: Alphia Kava D on: 11/22/2018 02:20 PM   Modules accepted: Orders

## 2018-11-22 NOTE — Telephone Encounter (Signed)
I'm calling because I'm experiencing a lot of pain in my left foot and calf as well. I had surgery on my left foot in February. Its swollen even with compression and red in the area as well.

## 2018-11-23 ENCOUNTER — Other Ambulatory Visit: Payer: Self-pay

## 2018-11-23 ENCOUNTER — Encounter (HOSPITAL_COMMUNITY): Payer: Self-pay

## 2018-11-23 ENCOUNTER — Ambulatory Visit (HOSPITAL_COMMUNITY)
Admission: RE | Admit: 2018-11-23 | Discharge: 2018-11-23 | Disposition: A | Discharge status: Home / Self Care | Payer: No Typology Code available for payment source | Source: Ambulatory Visit | Attending: Cardiovascular Disease | Admitting: Cardiovascular Disease

## 2018-11-23 DIAGNOSIS — R609 Edema, unspecified: Secondary | ICD-10-CM | POA: Diagnosis present

## 2018-11-23 DIAGNOSIS — Z09 Encounter for follow-up examination after completed treatment for conditions other than malignant neoplasm: Secondary | ICD-10-CM | POA: Diagnosis present

## 2018-11-23 DIAGNOSIS — M79662 Pain in left lower leg: Secondary | ICD-10-CM | POA: Diagnosis present

## 2018-11-23 NOTE — Progress Notes (Signed)
LLE venous duplex completed. See results under Chart Review- CV proc.  

## 2018-11-23 NOTE — Telephone Encounter (Signed)
[name] - [facility] states pt is negative for DVT.

## 2018-11-23 NOTE — Telephone Encounter (Signed)
I called pt and informed the venous doppler was negative for DVT, that part of her swelling may be she is having to be up on the foot at work. I asked pt when she put on the compression sock and she stated after she had been up a while, and right before she put on her shoes. I told her she may want to shower at night, then put the compression sock on 1st thing in the morning before swinging her legs over the side of the bed, this would train the swelling to stay out of the foot. Pt states understanding. I told pt that if she began to have uncomfortable swelling while in the compression sock to remove and wrap with an ace wrap beginning at the toes, and that would provide some compression but not as tight. I told pt she may also decrease the activity she has in the surgical foot gear which can cause leg pain. Pt states understanding.

## 2018-11-23 NOTE — Telephone Encounter (Signed)
Left message informing pt we had received results from the doppler and I would call her at work to discuss.

## 2018-11-29 ENCOUNTER — Telehealth: Payer: Self-pay | Admitting: *Deleted

## 2018-11-29 NOTE — Telephone Encounter (Signed)
I called, pt states the knuckle is swelling and painful when bending. I told pt that she should rest, ice and elevate for the symptoms. I told pt that if she was beginning to transfer to the surgical shoe, continue to wear the surgical boot at work, and wear the surgical shoe at home as long as it is comfortable, then may switch when the shoe became uncomfortable, and if continues to have pain call again on Thursday and we would get her in to the office. Pt states understanding.

## 2018-11-29 NOTE — Telephone Encounter (Signed)
Pt complained of increase in pain and swelling in the big toe.

## 2018-12-01 ENCOUNTER — Other Ambulatory Visit: Payer: Self-pay | Admitting: Family Medicine

## 2018-12-01 MED FILL — CETIRIZINE HCL 10 MG TABS: 10 | 90 days supply | Qty: 90 | Fill #1

## 2018-12-06 ENCOUNTER — Other Ambulatory Visit: Payer: Self-pay

## 2018-12-06 ENCOUNTER — Encounter: Payer: Self-pay | Admitting: Podiatry

## 2018-12-06 ENCOUNTER — Ambulatory Visit (INDEPENDENT_AMBULATORY_CARE_PROVIDER_SITE_OTHER): Payer: No Typology Code available for payment source | Admitting: Podiatry

## 2018-12-06 ENCOUNTER — Ambulatory Visit (INDEPENDENT_AMBULATORY_CARE_PROVIDER_SITE_OTHER): Payer: No Typology Code available for payment source

## 2018-12-06 DIAGNOSIS — M2012 Hallux valgus (acquired), left foot: Secondary | ICD-10-CM | POA: Diagnosis not present

## 2018-12-06 DIAGNOSIS — M779 Enthesopathy, unspecified: Secondary | ICD-10-CM

## 2018-12-06 MED ORDER — TRIAMCINOLONE ACETONIDE 10 MG/ML IJ SUSP
10.0000 mg | Freq: Once | INTRAMUSCULAR | Status: AC
  Administered 2018-12-06: 10 mg

## 2018-12-06 NOTE — Progress Notes (Signed)
Subjective:   Patient ID: [name], female   DOB: 43 y.o.   MRN: [id number]   HPI Patient states overall she is doing good with the bunion surgery but she is developing some pain in her forefoot and she knows her gait has changed since she had the surgery done she is actually walking better but now is developing pain in her forefoot   ROS      Objective:  Physical Exam  Neurovascular status intact with patient found to have exquisite discomfort second and third MPJs left with no significant swelling and excellent range of motion of the first MPJ with no crepitus of the joint     Assessment:  Doing well post osteotomy first metatarsal left with inflammation pain of the second third which may be due to the gait change     Plan:  H&P condition reviewed and at this point discussed again change in the pressure maker on this area.  I did do a anesthetic prep of the second and third MPJ left and using sterile technique I aspirated each joint getting out a small amount of clear fluid and injected with 1/4 cc dexamethasone Kenalog into the second MPJ and then into the third MPJ.  I then applied a metatarsal pad with instructions on usage and advised on rigid bottom shoe and reappoint to recheck  X-rays indicate that the first MPJ is healing well fixation in place joint congruence and satisfactory shortening of the first metatarsal due to the chronic hallux limitus

## 2018-12-13 ENCOUNTER — Encounter: Payer: No Typology Code available for payment source | Admitting: Podiatry

## 2019-01-03 ENCOUNTER — Encounter: Payer: No Typology Code available for payment source | Admitting: Podiatry

## 2019-01-03 ENCOUNTER — Ambulatory Visit: Payer: No Typology Code available for payment source

## 2019-01-03 NOTE — Progress Notes (Signed)
This encounter was created in error - please disregard.

## 2019-01-04 ENCOUNTER — Telehealth: Payer: Self-pay | Admitting: Physical Medicine and Rehabilitation

## 2019-01-04 ENCOUNTER — Encounter (INDEPENDENT_AMBULATORY_CARE_PROVIDER_SITE_OTHER): Payer: Self-pay | Admitting: Physical Medicine and Rehabilitation

## 2019-01-04 NOTE — Telephone Encounter (Signed)
Is auth needed [place] [id number]? Scheduled for 6/1.

## 2019-01-04 NOTE — Telephone Encounter (Signed)
ok 

## 2019-01-05 NOTE — Telephone Encounter (Signed)
Spoke with [name] and she states no PA is needed if in office procedure does not cost over $1,000. Procedure [id number] cost 695.00. Reference [phone]

## 2019-01-17 ENCOUNTER — Other Ambulatory Visit: Payer: Self-pay | Admitting: Physical Medicine and Rehabilitation

## 2019-01-17 ENCOUNTER — Telehealth: Payer: Self-pay | Admitting: Physical Medicine and Rehabilitation

## 2019-01-17 DIAGNOSIS — F411 Generalized anxiety disorder: Secondary | ICD-10-CM

## 2019-01-17 MED ORDER — DIAZEPAM 5 MG PO TABS: ORAL_TABLET | ORAL | 0 refills | Status: DC

## 2019-01-17 MED FILL — diazePAM 5 MG TABS: 5 | 1 days supply | Qty: 2 | Fill #0

## 2019-01-17 NOTE — Progress Notes (Signed)
Pre-procedure diazepam ordered for pre-operative anxiety.  

## 2019-01-17 NOTE — Telephone Encounter (Signed)
Done

## 2019-01-17 NOTE — Telephone Encounter (Signed)
Patient notified

## 2019-01-18 ENCOUNTER — Ambulatory Visit: Payer: Self-pay

## 2019-01-18 ENCOUNTER — Ambulatory Visit (INDEPENDENT_AMBULATORY_CARE_PROVIDER_SITE_OTHER): Payer: No Typology Code available for payment source | Admitting: Physical Medicine and Rehabilitation

## 2019-01-18 ENCOUNTER — Encounter: Payer: Self-pay | Admitting: Physical Medicine and Rehabilitation

## 2019-01-18 ENCOUNTER — Other Ambulatory Visit: Payer: Self-pay

## 2019-01-18 VITALS — BP 138/104 | HR 83

## 2019-01-18 DIAGNOSIS — M5412 Radiculopathy, cervical region: Secondary | ICD-10-CM

## 2019-01-18 MED ORDER — METHYLPREDNISOLONE ACETATE 80 MG/ML IJ SUSP: 80.0000 mg | Freq: Once | INTRAMUSCULAR | Status: DC

## 2019-01-18 NOTE — Progress Notes (Signed)
 .  Numeric Pain Rating Scale and Functional Assessment Average Pain 7   In the last MONTH (on 0-10 scale) has pain interfered with the following?  1. General activity like being  able to carry out your everyday physical activities such as walking, climbing stairs, carrying groceries, or moving a chair?  Rating(6)   +Driver, -BT, -Dye Allergies.  

## 2019-01-18 NOTE — Procedures (Signed)
Cervical Epidural Steroid Injection - Interlaminar Approach with Fluoroscopic Guidance  Patient: [name]      Date of Birth: 06-30-1976 MRN: [id number] PCP: Sheliah Hatch, MD      Visit Date: 01/18/2019   Universal Protocol:    Date/Time: 05/19/208:15 AM  Consent Given By: the patient  Position: PRONE  Additional Comments: Vital signs were monitored before and after the procedure. Patient was prepped and draped in the usual sterile fashion. The correct patient, procedure, and site was verified.   Injection Procedure Details:  Procedure Site One Meds Administered:  Meds ordered this encounter  Medications  . methylPREDNISolone acetate (DEPO-MEDROL) injection 80 mg     Laterality: Bilateral  Location/Site: C7-T1  Needle size: 20 G  Needle type: Touhy  Needle Placement: Paramedian epidural space  Findings:  -Comments: Excellent flow of contrast into the epidural space.  Procedure Details: Using a paramedian approach from the side mentioned above, the region overlying the inferior lamina was localized under fluoroscopic visualization and the soft tissues overlying this structure were infiltrated with 4 ml. of 1% Lidocaine without Epinephrine. A # 20 gauge, Tuohy needle was inserted into the epidural space using a paramedian approach.  The epidural space was localized using loss of resistance along with lateral and contralateral oblique bi-planar fluoroscopic views.  After negative aspirate for air, blood, and CSF, a 2 ml. volume of Isovue-250 was injected into the epidural space and the flow of contrast was observed. Radiographs were obtained for documentation purposes.   The injectate was administered into the level noted above.  Additional Comments:  The patient tolerated the procedure well Dressing: 2 x 2 sterile gauze and Band-Aid    Post-procedure details: Patient was observed during the procedure. Post-procedure instructions were  reviewed.  Patient left the clinic in stable condition.

## 2019-01-18 NOTE — Progress Notes (Signed)
[name] - 43 y.o. female MRN [id number]  Date of birth: 07-19-1976  Office Visit Note: Visit Date: 01/18/2019 PCP: Sheliah Hatch, MD Referred by: Sheliah Hatch, MD  Subjective: Chief Complaint  Patient presents with  . Neck - Pain  . Right Shoulder - Pain  . Left Shoulder - Pain  . Head - Pain   HPI:  [name] is a 43 y.o. female who comes in today For repeat cervical epidural steroid injection.  She was originally sent to [place] by Dr. Gaspar Bidding.  She has had conservative care with medication management and physical therapy activity modification.  MRI showed mainly spondylosis with some foraminal narrowing early for her age.  No high-grade central stenosis.  Prior epidural injection completed last year seemed to help quite a bit.  This was in October.  Said worsening symptoms recently right more than left neck and shoulder pain but bilateral.  She does use a lot of ice which seems to help.  Rates her pain a 7 out of 10.  We will repeat today diagnostically hopefully therapeutically would consider facet joint block.  Talked about activity modification and seeing Dr. Berline Chough back for more instruction on exercises.  ROS Otherwise per HPI.  Assessment & Plan: Visit Diagnoses:  1. Cervical radiculopathy     Plan: No additional findings.   Meds & Orders:  Meds ordered this encounter  Medications  . methylPREDNISolone acetate (DEPO-MEDROL) injection 80 mg    Orders Placed This Encounter  Procedures  . XR C-ARM NO REPORT  . Epidural Steroid injection    Follow-up: Return if symptoms worsen or fail to improve.   Procedures: No procedures performed  Cervical Epidural Steroid Injection - Interlaminar Approach with Fluoroscopic Guidance  Patient: [name]      Date of Birth: 01/25/1976 MRN: [id number] PCP: Sheliah Hatch, MD      Visit Date: 01/18/2019   Universal Protocol:    Date/Time: 05/19/208:15 AM  Consent Given By: the patient   Position: PRONE  Additional Comments: Vital signs were monitored before and after the procedure. Patient was prepped and draped in the usual sterile fashion. The correct patient, procedure, and site was verified.   Injection Procedure Details:  Procedure Site One Meds Administered:  Meds ordered this encounter  Medications  . methylPREDNISolone acetate (DEPO-MEDROL) injection 80 mg     Laterality: Bilateral  Location/Site: C7-T1  Needle size: 20 G  Needle type: Touhy  Needle Placement: Paramedian epidural space  Findings:  -Comments: Excellent flow of contrast into the epidural space.  Procedure Details: Using a paramedian approach from the side mentioned above, the region overlying the inferior lamina was localized under fluoroscopic visualization and the soft tissues overlying this structure were infiltrated with 4 ml. of 1% Lidocaine without Epinephrine. A # 20 gauge, Tuohy needle was inserted into the epidural space using a paramedian approach.  The epidural space was localized using loss of resistance along with lateral and contralateral oblique bi-planar fluoroscopic views.  After negative aspirate for air, blood, and CSF, a 2 ml. volume of Isovue-250 was injected into the epidural space and the flow of contrast was observed. Radiographs were obtained for documentation purposes.   The injectate was administered into the level noted above.  Additional Comments:  The patient tolerated the procedure well Dressing: 2 x 2 sterile gauze and Band-Aid    Post-procedure details: Patient was observed during the procedure. Post-procedure instructions were reviewed.  Patient left the  clinic in stable condition.    Clinical History: MRI CERVICAL SPINE WITHOUT CONTRAST  TECHNIQUE: Multiplanar, multisequence MR imaging of the cervical spine was performed. No intravenous contrast was administered.  COMPARISON:  None.  FINDINGS: The patient was unable to remain  motionless for the exam. Small or subtle lesions could be overlooked.  Alignment: Anatomic  Vertebrae: No fracture, evidence of discitis, or bone lesion.  Cord: Normal signal and morphology. No significant stenosis or compression.  Posterior Fossa, vertebral arteries, paraspinal tissues: Negative.  Disc levels:  C2-3:  Normal.  C3-4: Central protrusion with osseous spurring. Effacement anterior subarachnoid space. RIGHT greater than LEFT C4 foraminal narrowing is compounded by facet arthropathy.  C4-5:  Tiny central protrusion.  No impingement.  C5-6:  Slight disc desiccation.  Facet arthropathy.  No impingement.  C6-7:  Normal interspace.  C7-T1:  Mild facet arthropathy.  Normal disc space.  No impingement.  IMPRESSION: Mild multilevel spondylosis. Central protrusion with osseous spurring at C3-4 in conjunction with facet arthropathy may be the symptomatic level. BILATERAL C4 foraminal narrowing is observed.   Electronically Signed   By: Elsie StainJohn T Curnes M.D.   On: 05/26/2018 14:58     Objective:  VS:  HT:    WT:   BMI:     BP:(!) 138/104  HR:83bpm  TEMP: ( )  RESP:  Physical Exam  Ortho Exam Imaging: No results found.

## 2019-01-31 ENCOUNTER — Encounter: Payer: No Typology Code available for payment source | Admitting: Physical Medicine and Rehabilitation

## 2019-02-01 ENCOUNTER — Encounter: Payer: No Typology Code available for payment source | Admitting: Physical Medicine and Rehabilitation

## 2019-02-09 ENCOUNTER — Encounter: Payer: Self-pay | Admitting: Physician Assistant

## 2019-02-09 ENCOUNTER — Other Ambulatory Visit: Payer: Self-pay

## 2019-02-09 ENCOUNTER — Ambulatory Visit (INDEPENDENT_AMBULATORY_CARE_PROVIDER_SITE_OTHER): Payer: No Typology Code available for payment source | Admitting: Physician Assistant

## 2019-02-09 ENCOUNTER — Encounter: Payer: Self-pay | Admitting: Family Medicine

## 2019-02-09 VITALS — BP 162/112 | HR 65 | Temp 99.1°F | Resp 16 | Ht 65.0 in | Wt 181.0 lb

## 2019-02-09 DIAGNOSIS — I10 Essential (primary) hypertension: Secondary | ICD-10-CM | POA: Diagnosis not present

## 2019-02-09 MED ORDER — LOSARTAN POTASSIUM-HCTZ 100-25 MG PO TABS: 1.0000 | ORAL_TABLET | Freq: Every day | ORAL | 1 refills | Status: DC

## 2019-02-09 MED FILL — LOSARTAN-HCTZ 100-25 MG TAB: 100-25 | 30 days supply | Qty: 30 | Fill #0

## 2019-02-09 NOTE — Progress Notes (Signed)
Virtual Visit via Video   I connected with patient on 02/09/19 at  1:20 PM EDT by a video enabled telemedicine application and verified that I am speaking with the correct person using two identifiers.  Location patient: Home Location provider: [facility] [place] Persons participating in the virtual visit: Patient, Provider, PA-S Raynelle Fanning(Julie), CMA (Rene PaciPatina Moore)  I discussed the limitations of evaluation and management by telemedicine and the availability of in person appointments. The patient expressed understanding and agreed to proceed.  Subjective:   HPI:   Patient presents today via video platform c/o headache. Notes this was first noted on Monday. Notes headache is intermittent, in the bilateral frontal region with associated R eye discomfort and some occasional blurring of vision. Denies photophobia, phonophobia, AMS. Denies double vision. Notes some mild nausea today without vomiting.  Notes some chronic allergy symptoms but denies sinus pain, fever, chills or other URI symptom. Has used [employer], Tylenol and has used Dr. Reino KentPepper (caffeine) for headache with some relief. Patient with history of HTN, previously on a regimen of losartan-HCTZ 100-25 mg QD. Has not taken medication in some time. Denies chest pain, SOB, palpitations, LH/DZ. Does not check BP regularly, today 179/116, 168/105, 162/112  Diet: 2 meals, salads for lunch w/ protein, denies increased salt intake, drinks 3-4 16 oz of water day Exercise: walking a lot at work   ROS:   See pertinent positives and negatives per HPI.  Patient Active Problem List   Diagnosis Date Noted  . Chronic cough 09/27/2018  . Cervical spondylosis 07/11/2018  . Lymphocytic colitis 05/21/2018  . Anxiety state 04/29/2016  . Atypical chest pain 02/22/2016  . Dysphagia, pharyngoesophageal phase 02/22/2016  . Physical exam 12/15/2014  . Left hip pain 09/29/2014  . Radicular low back pain 09/11/2014  . Bilateral hip bursitis  09/11/2014  . BPV (benign positional vertigo) 02/22/2014  . HTN (hypertension) 02/22/2014    Social History   Tobacco Use  . Smoking status: Never Smoker  . Smokeless tobacco: Never Used  Substance Use Topics  . Alcohol use: No    Current Outpatient Medications:  .  acetaminophen (TYLENOL) 500 MG tablet, acetaminophen 500 mg tablet   1000 mg as needed by oral route., Disp: , Rfl:  .  Albuterol Sulfate (PROAIR RESPICLICK) 108 (90 Base) MCG/ACT AEPB, Inhale 2 puffs into the lungs every 4 (four) hours as needed., Disp: 1 each, Rfl: 1 .  cetirizine (ZYRTEC) 10 MG tablet, Take 1 tablet (10 mg total) by mouth daily. (Patient not taking: Reported on 02/09/2019), Disp: 30 tablet, Rfl: 11 .  chlorpheniramine (CHLOR-TRIMETON) 4 MG tablet, Take 1 tablet (4 mg total) by mouth 2 (two) times daily as needed for allergies. (Patient not taking: Reported on 02/09/2019), Disp: 30 tablet, Rfl: 0 .  hyoscyamine (LEVSIN SL) 0.125 MG SL tablet, Place 1 tablet (0.125 mg total) under the tongue every 6 (six) hours as needed. (Patient not taking: Reported on 02/09/2019), Disp: 45 tablet, Rfl: 1 .  losartan-hydrochlorothiazide (HYZAAR) 100-25 MG tablet, Take 1 tablet by mouth daily., Disp: 30 tablet, Rfl: 1  Allergies  Allergen Reactions  . Augmentin [Amoxicillin-Pot Clavulanate] Nausea And Vomiting  . Biotin Other (See Comments)    Severe yeast infection  . Sulfa Antibiotics Other (See Comments)    Reaction:  Muscle pain     Objective:   BP (!) 162/112   Pulse 65   Temp 99.1 F (37.3 C) (Oral)   Resp 16   Ht 5\' 5"  (1.651  m)   Wt 181 lb (82.1 kg)   SpO2 100%   BMI 30.12 kg/m   Patient is well-developed, well-nourished in no acute distress.  Resting comfortably  at home.  Head is normocephalic, atraumatic.  No labored breathing.  Speech is clear and coherent with logical contest.  Patient is alert and oriented at baseline.   Assessment and Plan:   1. Essential hypertension With headache. No  alarm signs/symptoms concerning for EOD. Diet is stable. Good hydration. Not taking medication in some time. Will restart Losartan-HCTZ at prior dose. Start stricter DASH diet. Tylenol or Excedrin for HA. She is to avoid use of Goody powders due to risk of gastritis and ulceration. She is to check home BP daily and record. Follow-up 1 week. Strict return precautions reviewed with patient.   - losartan-hydrochlorothiazide (HYZAAR) 100-25 MG tablet; Take 1 tablet by mouth daily.  Dispense: 30 tablet; Refill: [place], [place] 02/09/2019

## 2019-02-09 NOTE — Progress Notes (Signed)
I have discussed the procedure for the virtual visit with the patient who has given consent to proceed with assessment and treatment.   Bertine Schlottman S Artisha Capri, CMA     

## 2019-02-09 NOTE — Patient Instructions (Addendum)
Instructions sent to MyChart.   Please keep well-hydrated and get plenty of rest.  Continue to eat a well-balanced diet. Limit salt (see recommendations below). Restart your BP medication as directed, taking daily. Set a reminder if you need to (Alarm on phone, sticky note on bathroom mirror) Check BP daily and record. Tylenol, Excedrin or Aleve for headaches. No more Goody Powders!!!  Follow-up with myself or Dr. Birdie Riddle in 1 week for reassessment.  If you note any new or worsening symptoms please let us know ASAP.    DASH Eating Plan DASH stands for "Dietary Approaches to Stop Hypertension." The DASH eating plan is a healthy eating plan that has been shown to reduce high blood pressure (hypertension). It may also reduce your risk for type 2 diabetes, heart disease, and stroke. The DASH eating plan may also help with weight loss. What are tips for following this plan?  General guidelines  Avoid eating more than 2,300 mg (milligrams) of salt (sodium) a day. If you have hypertension, you may need to reduce your sodium intake to 1,500 mg a day.  Limit alcohol intake to no more than 1 drink a day for nonpregnant women and 2 drinks a day for men. One drink equals 12 oz of beer, 5 oz of wine, or 1 oz of hard liquor.  Work with your health care provider to maintain a healthy body weight or to lose weight. Ask what an ideal weight is for you.  Get at least 30 minutes of exercise that causes your heart to beat faster (aerobic exercise) most days of the week. Activities may include walking, swimming, or biking.  Work with your health care provider or diet and nutrition specialist (dietitian) to adjust your eating plan to your individual calorie needs. Reading food labels   Check food labels for the amount of sodium per serving. Choose foods with less than 5 percent of the Daily Value of sodium. Generally, foods with less than 300 mg of sodium per serving fit into this eating plan.  To find  whole grains, look for the word "whole" as the first word in the ingredient list. Shopping  Buy products labeled as "low-sodium" or "no salt added."  Buy fresh foods. Avoid canned foods and premade or frozen meals. Cooking  Avoid adding salt when cooking. Use salt-free seasonings or herbs instead of table salt or sea salt. Check with your health care provider or pharmacist before using salt substitutes.  Do not fry foods. Cook foods using healthy methods such as baking, boiling, grilling, and broiling instead.  Cook with heart-healthy oils, such as olive, canola, soybean, or sunflower oil. Meal planning  Eat a balanced diet that includes: ? 5 or more servings of fruits and vegetables each day. At each meal, try to fill half of your plate with fruits and vegetables. ? Up to 6-8 servings of whole grains each day. ? Less than 6 oz of lean meat, poultry, or fish each day. A 3-oz serving of meat is about the same size as a deck of cards. One egg equals 1 oz. ? 2 servings of low-fat dairy each day. ? A serving of nuts, seeds, or beans 5 times each week. ? Heart-healthy fats. Healthy fats called Omega-3 fatty acids are found in foods such as flaxseeds and coldwater fish, like sardines, salmon, and mackerel.  Limit how much you eat of the following: ? Canned or prepackaged foods. ? Food that is high in trans fat, such as fried foods. ? Food  that is high in saturated fat, such as fatty meat. ? Sweets, desserts, sugary drinks, and other foods with added sugar. ? Full-fat dairy products.  Do not salt foods before eating.  Try to eat at least 2 vegetarian meals each week.  Eat more home-cooked food and less restaurant, buffet, and fast food.  When eating at a restaurant, ask that your food be prepared with less salt or no salt, if possible. What foods are recommended? The items listed may not be a complete list. Talk with your dietitian about what dietary choices are best for  you. Grains Whole-grain or whole-wheat bread. Whole-grain or whole-wheat pasta. Brown rice. [place]. Bulgur. Whole-grain and low-sodium cereals. Pita bread. Low-fat, low-sodium crackers. Whole-wheat flour tortillas. Vegetables Fresh or frozen vegetables (raw, steamed, roasted, or grilled). Low-sodium or reduced-sodium tomato and vegetable juice. Low-sodium or reduced-sodium tomato sauce and tomato paste. Low-sodium or reduced-sodium canned vegetables. Fruits All fresh, dried, or frozen fruit. Canned fruit in natural juice (without added sugar). Meat and other protein foods Skinless chicken or [place]. Ground chicken or [place]. Pork with fat trimmed off. Fish and seafood. Egg whites. Dried beans, peas, or lentils. Unsalted nuts, nut butters, and seeds. Unsalted canned beans. Lean cuts of beef with fat trimmed off. Low-sodium, lean deli meat. Dairy Low-fat (1%) or fat-free (skim) milk. Fat-free, low-fat, or reduced-fat cheeses. Nonfat, low-sodium ricotta or cottage cheese. Low-fat or nonfat yogurt. Low-fat, low-sodium cheese. Fats and oils Soft margarine without trans fats. Vegetable oil. Low-fat, reduced-fat, or light mayonnaise and salad dressings (reduced-sodium). Canola, safflower, olive, soybean, and sunflower oils. Avocado. Seasoning and other foods Herbs. Spices. Seasoning mixes without salt. Unsalted popcorn and pretzels. Fat-free sweets. What foods are not recommended? The items listed may not be a complete list. Talk with your dietitian about what dietary choices are best for you. Grains Baked goods made with fat, such as croissants, muffins, or some breads. Dry pasta or rice meal packs. Vegetables Creamed or fried vegetables. Vegetables in a cheese sauce. Regular canned vegetables (not low-sodium or reduced-sodium). Regular canned tomato sauce and paste (not low-sodium or reduced-sodium). Regular tomato and vegetable juice (not low-sodium or reduced-sodium). Angie Fava.  Olives. Fruits Canned fruit in a light or heavy syrup. Fried fruit. Fruit in cream or butter sauce. Meat and other protein foods Fatty cuts of meat. Ribs. Fried meat. Berniece Salines. Sausage. Bologna and other processed lunch meats. Salami. Fatback. Hotdogs. Bratwurst. Salted nuts and seeds. Canned beans with added salt. Canned or smoked fish. Whole eggs or egg yolks. Chicken or [place] with skin. Dairy Whole or 2% milk, cream, and half-and-half. Whole or full-fat cream cheese. Whole-fat or sweetened yogurt. Full-fat cheese. Nondairy creamers. Whipped toppings. Processed cheese and cheese spreads. Fats and oils Butter. Stick margarine. Lard. Shortening. Ghee. Bacon fat. Tropical oils, such as coconut, palm kernel, or palm oil. Seasoning and other foods Salted popcorn and pretzels. Onion salt, garlic salt, seasoned salt, table salt, and sea salt. Worcestershire sauce. Tartar sauce. Barbecue sauce. Teriyaki sauce. Soy sauce, including reduced-sodium. Steak sauce. Canned and packaged gravies. Fish sauce. Oyster sauce. Cocktail sauce. Horseradish that you find on the shelf. Ketchup. Mustard. Meat flavorings and tenderizers. Bouillon cubes. Hot sauce and Tabasco sauce. Premade or packaged marinades. Premade or packaged taco seasonings. Relishes. Regular salad dressings. Where to find more information:  National Heart, Lung, and [place]: https://wilson-eaton.com/  American Heart Association: www.heart.org Summary  The DASH eating plan is a healthy eating plan that has been shown to reduce high blood pressure (  hypertension). It may also reduce your risk for type 2 diabetes, heart disease, and stroke.  With the DASH eating plan, you should limit salt (sodium) intake to 2,300 mg a day. If you have hypertension, you may need to reduce your sodium intake to 1,500 mg a day.  When on the DASH eating plan, aim to eat more fresh fruits and vegetables, whole grains, lean proteins, low-fat dairy, and heart-healthy  fats.  Work with your health care provider or diet and nutrition specialist (dietitian) to adjust your eating plan to your individual calorie needs. This information is not intended to replace advice given to you by your health care provider. Make sure you discuss any questions you have with your health care provider. Document Released: 08/07/2011 Document Revised: 08/11/2016 Document Reviewed: 08/11/2016 Elsevier Interactive Patient Education  2019 [employer]

## 2019-02-17 ENCOUNTER — Encounter: Payer: Self-pay | Admitting: Family Medicine

## 2019-03-07 ENCOUNTER — Telehealth: Payer: Self-pay | Admitting: *Deleted

## 2019-03-07 ENCOUNTER — Encounter: Payer: Self-pay | Admitting: Family Medicine

## 2019-03-07 DIAGNOSIS — Z9889 Other specified postprocedural states: Secondary | ICD-10-CM

## 2019-03-07 DIAGNOSIS — M79672 Pain in left foot: Secondary | ICD-10-CM

## 2019-03-07 NOTE — Telephone Encounter (Signed)
Pt called states she is having pain and swelling in the left big toe to the arch and received an injection not to long ago, but would like to know what she can take for the pain, she is using Extra Strength Tylenol.

## 2019-03-07 NOTE — Telephone Encounter (Signed)
Left message informing pt, I had reviewed her clinicals 12/2018 and she is being seen for post op left foot bunion and capsulitis and that with the surgery she may have discomfort and swelling to varying degrees for 6-9 months, but with diagnosis of capsulitis and inflammation of the capsule surrounding the big toe joint she should make an appt, and until seen in office ice the area 3-4 times daily for 15-20 minutes protecting the skin from the ice with a light cloth.

## 2019-03-10 ENCOUNTER — Ambulatory Visit (INDEPENDENT_AMBULATORY_CARE_PROVIDER_SITE_OTHER): Payer: No Typology Code available for payment source | Admitting: Podiatry

## 2019-03-10 ENCOUNTER — Ambulatory Visit (INDEPENDENT_AMBULATORY_CARE_PROVIDER_SITE_OTHER): Payer: No Typology Code available for payment source

## 2019-03-10 ENCOUNTER — Other Ambulatory Visit: Payer: Self-pay

## 2019-03-10 ENCOUNTER — Encounter: Payer: Self-pay | Admitting: Podiatry

## 2019-03-10 VITALS — Temp 98.0°F

## 2019-03-10 DIAGNOSIS — M779 Enthesopathy, unspecified: Secondary | ICD-10-CM | POA: Diagnosis not present

## 2019-03-10 DIAGNOSIS — M778 Other enthesopathies, not elsewhere classified: Secondary | ICD-10-CM

## 2019-03-10 MED ORDER — DICLOFENAC SODIUM 75 MG PO TBEC: 75.0000 mg | DELAYED_RELEASE_TABLET | Freq: Two times a day (BID) | ORAL | 2 refills | Status: DC

## 2019-03-10 MED FILL — DICLOFENAC SODIUM 75 MG TAB: 75 | 25 days supply | Qty: 50 | Fill #0

## 2019-03-11 NOTE — Progress Notes (Signed)
Subjective:   Patient ID: [name], female   DOB: 43 y.o.   MRN: [id number]   HPI Patient presents stating that the left big toe joint has been bothering her a little bit the last few weeks and that she had been doing jumping jacks and had been very active   ROS      Objective:  Physical Exam  Neurovascular status intact with patient found to have inflammation discomfort around the first MPJ left with still very good range of motion no crepitus of the joint note     Assessment:  Inflammatory capsulitis which may have been due to the injury she sustained when she was doing excessive activity     Plan:  H&P condition reviewed and today I did do a careful steroid injection around the first MPJ left and I went ahead and I advised on more rigid bottom shoe and if this were to persist we may need to consider pin removal.  Continue orthotics  X-ray indicates that there is good healing of the osteotomy the joint is congruence and appears open

## 2019-03-16 ENCOUNTER — Encounter: Payer: Self-pay | Admitting: Family Medicine

## 2019-04-01 ENCOUNTER — Encounter: Payer: No Typology Code available for payment source | Admitting: Family Medicine

## 2019-04-05 ENCOUNTER — Ambulatory Visit (INDEPENDENT_AMBULATORY_CARE_PROVIDER_SITE_OTHER): Payer: No Typology Code available for payment source | Admitting: Physician Assistant

## 2019-04-05 ENCOUNTER — Other Ambulatory Visit: Payer: Self-pay

## 2019-04-05 ENCOUNTER — Encounter: Payer: Self-pay | Admitting: Physician Assistant

## 2019-04-05 VITALS — BP 130/84 | HR 78 | Temp 98.8°F | Resp 16 | Ht 64.0 in | Wt 178.0 lb

## 2019-04-05 DIAGNOSIS — Z0001 Encounter for general adult medical examination with abnormal findings: Secondary | ICD-10-CM | POA: Diagnosis not present

## 2019-04-05 DIAGNOSIS — J310 Chronic rhinitis: Secondary | ICD-10-CM

## 2019-04-05 DIAGNOSIS — Z23 Encounter for immunization: Secondary | ICD-10-CM

## 2019-04-05 DIAGNOSIS — Z0000 Encounter for general adult medical examination without abnormal findings: Secondary | ICD-10-CM

## 2019-04-05 LAB — CBC WITH DIFFERENTIAL/PLATELET
Basophils Absolute: 0.1 10*3/uL (ref 0.0–0.1)
Basophils Relative: 1.9 % (ref 0.0–3.0)
Eosinophils Absolute: 0.1 10*3/uL (ref 0.0–0.7)
Eosinophils Relative: 3 % (ref 0.0–5.0)
HCT: 40.5 % (ref 36.0–46.0)
Hemoglobin: 13.7 g/dL (ref 12.0–15.0)
Lymphocytes Relative: 32.8 % (ref 12.0–46.0)
Lymphs Abs: 1.1 10*3/uL (ref 0.7–4.0)
MCHC: 33.9 g/dL (ref 30.0–36.0)
MCV: 88.3 fl (ref 78.0–100.0)
Monocytes Absolute: 0.3 10*3/uL (ref 0.1–1.0)
Monocytes Relative: 9.5 % (ref 3.0–12.0)
Neutro Abs: 1.7 10*3/uL (ref 1.4–7.7)
Neutrophils Relative %: 52.8 % (ref 43.0–77.0)
Platelets: 356 10*3/uL (ref 150.0–400.0)
RBC: 4.59 Mil/uL (ref 3.87–5.11)
RDW: 12.9 % (ref 11.5–15.5)
WBC: 3.2 10*3/uL — ABNORMAL LOW (ref 4.0–10.5)

## 2019-04-05 LAB — COMPREHENSIVE METABOLIC PANEL
ALT: 10 U/L (ref 0–35)
AST: 11 U/L (ref 0–37)
Albumin: 4.4 g/dL (ref 3.5–5.2)
Alkaline Phosphatase: 52 U/L (ref 39–117)
BUN: 12 mg/dL (ref 6–23)
CO2: 30 mEq/L (ref 19–32)
Calcium: 9.6 mg/dL (ref 8.4–10.5)
Chloride: 101 mEq/L (ref 96–112)
Creatinine, Ser: 0.9 mg/dL (ref 0.40–1.20)
GFR: 82.31 mL/min (ref >60.00)
Glucose, Bld: 81 mg/dL (ref 70–99)
Potassium: 3.8 mEq/L (ref 3.5–5.1)
Sodium: 138 mEq/L (ref 135–145)
Total Bilirubin: 0.4 mg/dL (ref 0.2–1.2)
Total Protein: 7.3 g/dL (ref 6.0–8.3)

## 2019-04-05 LAB — LIPID PANEL
Cholesterol: 142 mg/dL (ref 0–200)
HDL: 39.8 mg/dL (ref >39.00)
LDL Cholesterol: 88 mg/dL (ref 0–99)
NonHDL: 102.41
Total CHOL/HDL Ratio: 4
Triglycerides: 73 mg/dL (ref 0.0–149.0)
VLDL: 14.6 mg/dL (ref 0.0–40.0)

## 2019-04-05 LAB — HEMOGLOBIN A1C: Hgb A1c MFr Bld: 5.6 % (ref 4.6–6.5)

## 2019-04-05 MED ORDER — LEVOCETIRIZINE DIHYDROCHLORIDE 5 MG PO TABS: 5.0000 mg | ORAL_TABLET | Freq: Every evening | ORAL | 1 refills | Status: DC

## 2019-04-05 MED ORDER — FLUTICASONE PROPIONATE 50 MCG/ACT NA SUSP: 2.0000 | Freq: Every day | NASAL | 6 refills | Status: DC

## 2019-04-05 MED FILL — FLUTICASONE PROP 50 MCG SPR: 50 | 30 days supply | Qty: 16 | Fill #0

## 2019-04-05 MED FILL — LEVOCETIRIZINE 5 MG TABLET: 5 | 30 days supply | Qty: 30 | Fill #0

## 2019-04-05 NOTE — Progress Notes (Signed)
Patient presents to clinic today for annual exam.  Patient is fasting for labs.  Acute Concerns: Patient endorses ongoing issue with rhinorrhea, nasal congestion, postnasal drip and cough that is mostly dry.  Notes this is been an ongoing issue for her.  Does have an albuterol inhaler for history of asthmatic bronchitis.  Notes very rare use of this medicine.  Is currently on a regimen of Zyrtec 10 mg daily which she endorses taking as directed.  Denies sinus pressure or sinus pain.  Denies ear pain, tooth pain.  Denies wheezing, chest tightness or shortness of breath.  Denies chest pain.  Denies fever, chills, malaise or fatigue.  Notes she only has a few months out of the year where she is symptom-free.  Has been this way for several years.  Health Maintenance: Immunizations --due for Tdap. PAP -- UTD  Past Medical History:  Diagnosis Date  . Arthritis    right foot  . Asthma   . Congenital flat foot    has had bilateral foot surgery to correct  . Frequent headaches   . GERD (gastroesophageal reflux disease)   . Hypertension   . Irritable bowel syndrome (IBS)    no current med.  . Painful orthopaedic hardware [facility](HCC) 03/2013   right foot    Past Surgical History:  Procedure Laterality Date  . CALCANEAL OSTEOTOMY Right 12/23/2012   Procedure: RIGHT CALCANEAL OSTEOTOMY, RIGHT LATERAL COLUMN LENGTHENING, RIGHT COTTON OSTEOTOMY, RIGHT FLEX DIGITORIUM LONGUS TRANSFER, RIGHT KIDNER PROCEDURE ;  Surgeon: Toni ArthursJohn Hewitt, MD;  Location: [facility];  Service: Orthopedics;  Laterality: Right;  . CESAREAN SECTION  07/13/2005; 06/03/2006  . DILATION AND EVACUATION  08/16/2008  . FOOT SURGERY Left 2002  . FOOT SURGERY Right 10/2018  . GASTROCNEMIUS RECESSION Right 12/23/2012   Procedure: RIGHT GASTRO RECESSION ;  Surgeon: Toni ArthursJohn Hewitt, MD;  Location: [facility];  Service: Orthopedics;  Laterality: Right;  . HARDWARE REMOVAL Right 03/24/2013   Procedure: HARDWARE  REMOVAL RIGHT HEEL;  Surgeon: Toni ArthursJohn Hewitt, MD;  Location:  SURGERY CENTER;  Service: Orthopedics;  Laterality: Right;  . INTRAUTERINE DEVICE INSERTION    . WISDOM TOOTH EXTRACTION      Current Outpatient Medications on File Prior to Visit  Medication Sig Dispense Refill  . Albuterol Sulfate (PROAIR RESPICLICK) 108 (90 Base) MCG/ACT AEPB Inhale 2 puffs into the lungs every 4 (four) hours as needed. 1 each 1  . aspirin-acetaminophen-caffeine (EXCEDRIN MIGRAINE) 250-250-65 MG tablet Take 1 tablet by mouth every 6 (six) hours as needed for headache.    . diclofenac (VOLTAREN) 75 MG EC tablet Take 1 tablet (75 mg total) by mouth 2 (two) times daily. 50 tablet 2  . losartan-hydrochlorothiazide (HYZAAR) 100-25 MG tablet Take 1 tablet by mouth daily. 30 tablet 1   No current facility-administered medications on file prior to visit.     Allergies  Allergen Reactions  . Augmentin [Amoxicillin-Pot Clavulanate] Nausea And Vomiting  . Biotin Other (See Comments)    Severe yeast infection  . Sulfa Antibiotics Other (See Comments)    Reaction:  Muscle pain     Family History  Problem Relation Age of Onset  . Hypertension Mother   . Hypertension Father   . Diabetes Father   . Irritable bowel syndrome Father   . Breast cancer Paternal Grandmother   . Colon cancer Cousin        maternal  . Rectal cancer Neg Hx   . Esophageal cancer Neg  Hx   . Stomach cancer Neg Hx     Social History   Socioeconomic History  . Marital status: Married    Spouse name: Not on file  . Number of children: 3  . Years of education: Not on file  . Highest education level: Not on file  Occupational History  . Occupation: Teacher, adult educationN    Employer: [employer]  Social Needs  . Financial resource strain: Not on file  . Food insecurity    Worry: Not on file    Inability: Not on file  . Transportation needs    Medical: Not on file    Non-medical: Not on file  Tobacco Use  . Smoking status: Never Smoker  .  Smokeless tobacco: Never Used  Substance and Sexual Activity  . Alcohol use: No  . Drug use: No  . Sexual activity: Yes    Birth control/protection: I.U.D.  Lifestyle  . Physical activity    Days per week: Not on file    Minutes per session: Not on file  . Stress: Not on file  Relationships  . Social Musicianconnections    Talks on phone: Not on file    Gets together: Not on file    Attends religious service: Not on file    Active member of club or organization: Not on file    Attends meetings of clubs or organizations: Not on file    Relationship status: Not on file  . Intimate partner violence    Fear of current or ex partner: Not on file    Emotionally abused: Not on file    Physically abused: Not on file    Forced sexual activity: Not on file  Other Topics Concern  . Not on file  Social History Narrative  . Not on file   Review of Systems  Constitutional: Negative for fever and weight loss.  HENT: Positive for congestion. Negative for ear discharge, ear pain, hearing loss and tinnitus.   Eyes: Negative for blurred vision, double vision, photophobia and pain.  Respiratory: Positive for cough. Negative for shortness of breath and wheezing.   Cardiovascular: Negative for chest pain and palpitations.  Gastrointestinal: Negative for abdominal pain, blood in stool, constipation, diarrhea, heartburn, melena, nausea and vomiting.  Genitourinary: Negative for dysuria, flank pain, frequency, hematuria and urgency.  Musculoskeletal: Negative for falls.  Neurological: Negative for dizziness, loss of consciousness and headaches.  Endo/Heme/Allergies: Negative for environmental allergies.  Psychiatric/Behavioral: Negative for depression, hallucinations, substance abuse and suicidal ideas. The patient is not nervous/anxious and does not have insomnia.     Resp 16   Ht 5\' 4"  (1.626 m)   Wt 178 lb (80.7 kg)   BMI 30.55 kg/m   Physical Exam Vitals signs reviewed.  Constitutional:       Appearance: She is well-developed.  HENT:     Head: Normocephalic and atraumatic.     Mouth/Throat:     Mouth: Mucous membranes are moist.  Eyes:     Pupils: Pupils are equal, round, and reactive to light.  Neck:     Musculoskeletal: Normal range of motion and neck supple.  Cardiovascular:     Rate and Rhythm: Normal rate and regular rhythm.     Heart sounds: Normal heart sounds.  Pulmonary:     Effort: Pulmonary effort is normal.     Breath sounds: Normal breath sounds.  Abdominal:     General: Bowel sounds are normal.     Palpations: Abdomen is soft. There is  no mass.     Tenderness: There is no abdominal tenderness.  Neurological:     Mental Status: She is alert.  Psychiatric:        Mood and Affect: Mood normal.    Assessment/Plan: 1. Visit for preventive health examination Depression screen negative. Health Maintenance reviewed. Preventive schedule discussed and handout given in AVS. Will obtain fasting labs today.  - CBC with Differential/Platelet - Comprehensive metabolic panel - Lipid panel - Hemoglobin A1c  2. Chronic rhinitis Ongoing.  We will have her stop cetirizine.  Start daily Xyzal 5 mg.  Begin daily Flonase.  Supportive measures reviewed.  If not improving will consider trial of Singulair.  Would also obtain chest x-ray at that time giving cough, just to make sure there is nothing else present.  May need referral to ENT or allergist. - levocetirizine (XYZAL) 5 MG tablet; Take 1 tablet (5 mg total) by mouth every evening.  Dispense: 30 tablet; Refill: 1 - fluticasone (FLONASE) 50 MCG/ACT nasal spray; Place 2 sprays into both nostrils daily.  Dispense: 16 g; Refill: 6  3. Need for Tdap vaccination Immunization updated today - Tdap vaccine greater than or equal to 7yo IM   Leeanne Rio, PA-C

## 2019-04-05 NOTE — Patient Instructions (Signed)
Please go to the lab for blood work.   Our office will call you with your results unless you have chosen to receive results via MyChart.  If your blood work is normal we will follow-up each year for physicals and as scheduled for chronic medical problems.  If anything is abnormal we will treat accordingly and get you in for a follow-up.  Please stop the Cetrizine and start the Xyzal and Flonase daily. Delsym if needed for cough. Let me know if not improving, we will proceed with chest x-ray and ENT assessment.    Preventive Care 44-60 Years Old, Female Preventive care refers to visits with your health care provider and lifestyle choices that can promote health and wellness. This includes:  A yearly physical exam. This may also be called an annual well check.  Regular dental visits and eye exams.  Immunizations.  Screening for certain conditions.  Healthy lifestyle choices, such as eating a healthy diet, getting regular exercise, not using drugs or products that contain nicotine and tobacco, and limiting alcohol use. What can I expect for my preventive care visit? Physical exam Your health care provider will check your:  Height and weight. This may be used to calculate body mass index (BMI), which tells if you are at a healthy weight.  Heart rate and blood pressure.  Skin for abnormal spots. Counseling Your health care provider may ask you questions about your:  Alcohol, tobacco, and drug use.  Emotional well-being.  Home and relationship well-being.  Sexual activity.  Eating habits.  Work and work Statistician.  Method of birth control.  Menstrual cycle.  Pregnancy history. What immunizations do I need?  Influenza (flu) vaccine  This is recommended every year. Tetanus, diphtheria, and pertussis (Tdap) vaccine  You may need a Td booster every 10 years. Varicella (chickenpox) vaccine  You may need this if you have not been vaccinated. Zoster (shingles)  vaccine  You may need this after age 44. Measles, mumps, and rubella (MMR) vaccine  You may need at least one dose of MMR if you were born in 1957 or later. You may also need a second dose. Pneumococcal conjugate (PCV13) vaccine  You may need this if you have certain conditions and were not previously vaccinated. Pneumococcal polysaccharide (PPSV23) vaccine  You may need one or two doses if you smoke cigarettes or if you have certain conditions. Meningococcal conjugate (MenACWY) vaccine  You may need this if you have certain conditions. Hepatitis A vaccine  You may need this if you have certain conditions or if you travel or work in places where you may be exposed to hepatitis A. Hepatitis B vaccine  You may need this if you have certain conditions or if you travel or work in places where you may be exposed to hepatitis B. Haemophilus influenzae type b (Hib) vaccine  You may need this if you have certain conditions. Human papillomavirus (HPV) vaccine  If recommended by your health care provider, you may need three doses over 6 months. You may receive vaccines as individual doses or as more than one vaccine together in one shot (combination vaccines). Talk with your health care provider about the risks and benefits of combination vaccines. What tests do I need? Blood tests  Lipid and cholesterol levels. These may be checked every 5 years, or more frequently if you are over 25 years old.  Hepatitis C test.  Hepatitis B test. Screening  Lung cancer screening. You may have this screening every year starting  at age 44 if you have a 30-pack-year history of smoking and currently smoke or have quit within the past 15 years.  Colorectal cancer screening. All adults should have this screening starting at age 44 and continuing until age 62. Your health care provider may recommend screening at age 44 if you are at increased risk. You will have tests every 1-10 years, depending on your  results and the type of screening test.  Diabetes screening. This is done by checking your blood sugar (glucose) after you have not eaten for a while (fasting). You may have this done every 1-3 years.  Mammogram. This may be done every 1-2 years. Talk with your health care provider about when you should start having regular mammograms. This may depend on whether you have a family history of breast cancer.  BRCA-related cancer screening. This may be done if you have a family history of breast, ovarian, tubal, or peritoneal cancers.  Pelvic exam and Pap test. This may be done every 3 years starting at age 44. Starting at age 44, this may be done every 5 years if you have a Pap test in combination with an HPV test. Other tests  Sexually transmitted disease (STD) testing.  Bone density scan. This is done to screen for osteoporosis. You may have this scan if you are at high risk for osteoporosis. Follow these instructions at home: Eating and drinking  Eat a diet that includes fresh fruits and vegetables, whole grains, lean protein, and low-fat dairy.  Take vitamin and mineral supplements as recommended by your health care provider.  Do not drink alcohol if: ? Your health care provider tells you not to drink. ? You are pregnant, may be pregnant, or are planning to become pregnant.  If you drink alcohol: ? Limit how much you have to 0-1 drink a day. ? Be aware of how much alcohol is in your drink. In the [place] one drink equals one 12 oz bottle of beer (355 mL), one 5 oz glass of wine (148 mL), or one 1 oz glass of hard liquor (44 mL). Lifestyle  Take daily care of your teeth and gums.  Stay active. Exercise for at least 30 minutes on 5 or more days each week.  Do not use any products that contain nicotine or tobacco, such as cigarettes, e-cigarettes, and chewing tobacco. If you need help quitting, ask your health care provider.  If you are sexually active, practice safe sex. Use a  condom or other form of birth control (contraception) in order to prevent pregnancy and STIs (sexually transmitted infections).  If told by your health care provider, take low-dose aspirin daily starting at age 44. What's next?  Visit your health care provider once a year for a well check visit.  Ask your health care provider how often you should have your eyes and teeth checked.  Stay up to date on all vaccines. This information is not intended to replace advice given to you by your health care provider. Make sure you discuss any questions you have with your health care provider. Document Released: 09/14/2015 Document Revised: 04/29/2018 Document Reviewed: 04/29/2018 Elsevier Patient Education  2020 [employer].

## 2019-04-08 ENCOUNTER — Encounter: Payer: Self-pay | Admitting: Physician Assistant

## 2019-04-14 ENCOUNTER — Ambulatory Visit: Payer: No Typology Code available for payment source | Admitting: Podiatry

## 2019-05-24 ENCOUNTER — Encounter: Payer: Self-pay | Admitting: Family Medicine

## 2019-05-25 MED ORDER — MOMETASONE FUROATE 50 MCG/ACT NA SUSP: NASAL | 12 refills | Status: DC

## 2019-05-25 MED FILL — MOMETASONE FUROATE 50 MCG S: 50 | 30 days supply | Qty: 17 | Fill #0

## 2019-06-08 ENCOUNTER — Encounter: Payer: Self-pay | Admitting: Family Medicine

## 2019-06-08 DIAGNOSIS — R05 Cough: Secondary | ICD-10-CM

## 2019-06-08 DIAGNOSIS — R053 Chronic cough: Secondary | ICD-10-CM

## 2019-06-08 DIAGNOSIS — R0982 Postnasal drip: Secondary | ICD-10-CM

## 2019-06-13 MED FILL — LOSARTAN-HCTZ 100-25 MG TAB: 100-25 | 30 days supply | Qty: 30 | Fill #1

## 2019-07-06 ENCOUNTER — Other Ambulatory Visit: Payer: Self-pay

## 2019-07-06 ENCOUNTER — Ambulatory Visit
Admission: RE | Admit: 2019-07-06 | Discharge: 2019-07-06 | Disposition: A | Discharge status: Home / Self Care | Payer: No Typology Code available for payment source | Source: Ambulatory Visit | Attending: Otolaryngology | Admitting: Otolaryngology

## 2019-07-06 ENCOUNTER — Other Ambulatory Visit: Payer: Self-pay | Admitting: Otolaryngology

## 2019-07-06 DIAGNOSIS — R05 Cough: Secondary | ICD-10-CM

## 2019-07-06 DIAGNOSIS — R059 Cough, unspecified: Secondary | ICD-10-CM

## 2019-07-19 ENCOUNTER — Telehealth: Payer: Self-pay

## 2019-07-19 ENCOUNTER — Other Ambulatory Visit: Payer: Self-pay | Admitting: Obstetrics and Gynecology

## 2019-07-19 DIAGNOSIS — R928 Other abnormal and inconclusive findings on diagnostic imaging of breast: Secondary | ICD-10-CM

## 2019-07-19 LAB — HM MAMMOGRAPHY

## 2019-07-19 NOTE — Telephone Encounter (Signed)
NOTES ON FILE FROM [facility] [phone], SENT REFERRAL TO SCHEDULING

## 2019-07-20 NOTE — Progress Notes (Signed)
Cardiology Office Note:   Date:  07/21/2019  NAME:  Tammy GOOTEE    MRN: [id number] DOB:  April 17, 1976   PCP:  Sheliah Hatch, MD  Cardiologist:  No primary care provider on file.  Electrophysiologist:  None   Referring MD: Graylin Shiver, MD   Chief Complaint  Patient presents with   Abnormal ECG   History of Present Illness:   [name] is a 43 y.o. female with a hx of arthritis, HTN who is being seen today for the evaluation of palpitations at the request of Graylin Shiver, MD.  Reports for the last 4 to 5 months she has noticed daily episodes of palpitations.  She reports she can have up to 4-5 episodes per day.  They last seconds.  They are bothersome for her.  She does report an associated cough with pain for which she saw an ENT.  She reports no chest pain or shortness of breath with the episodes.  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  She has no limitations completing her job.  She has 3 children and grandchildren.  She does report that work is rather stressful and taking care of her children is also stressful as well.  Blood pressure is elevated today 162/109.  She is taking a losartan HCTZ combination pill she does report significant salt intake.  She reports no excess caffeine consumption.  She will occasionally drink an energy drink.  The palpitations have no association with energy drinks or excess caffeine consumption.  She has no strong risk factors for cardiovascular disease.  Her father had an ablation for something.  No early history of myocardial infarction in the family.  Most recent labs from primary care physician show A1c 5.6, LDL 88, HDL 40, serum creatinine 0.90, hemoglobin 13.7.  Past Medical History: Past Medical History:  Diagnosis Date   Arthritis    right foot   Asthma    Congenital flat foot    has had bilateral foot surgery to correct   Frequent headaches    GERD  (gastroesophageal reflux disease)    Hypertension    Irritable bowel syndrome (IBS)    no current med.   Painful orthopaedic hardware [facility]) 03/2013   right foot    Past Surgical History: Past Surgical History:  Procedure Laterality Date   CALCANEAL OSTEOTOMY Right 12/23/2012   Procedure: RIGHT CALCANEAL OSTEOTOMY, RIGHT LATERAL COLUMN LENGTHENING, RIGHT COTTON OSTEOTOMY, RIGHT FLEX DIGITORIUM LONGUS TRANSFER, RIGHT KIDNER PROCEDURE ;  Surgeon: Toni Arthurs, MD;  Location: [facility];  Service: Orthopedics;  Laterality: Right;   CESAREAN SECTION  07/13/2005; 06/03/2006   DILATION AND EVACUATION  08/16/2008   FOOT SURGERY Left 2002   FOOT SURGERY Right 10/2018   GASTROCNEMIUS RECESSION Right 12/23/2012   Procedure: RIGHT GASTRO RECESSION ;  Surgeon: Toni Arthurs, MD;  Location: [facility];  Service: Orthopedics;  Laterality: Right;   HARDWARE REMOVAL Right 03/24/2013   Procedure: HARDWARE REMOVAL RIGHT HEEL;  Surgeon: Toni Arthurs, MD;  Location: [facility];  Service: Orthopedics;  Laterality: Right;   INTRAUTERINE DEVICE INSERTION     WISDOM TOOTH EXTRACTION      Current Medications: Current Meds  Medication Sig   Albuterol Sulfate (PROAIR RESPICLICK) 108 (90 Base) MCG/ACT AEPB Inhale 2 puffs into the lungs every 4 (four) hours as needed.   aspirin-acetaminophen-caffeine (EXCEDRIN MIGRAINE) 250-250-65 MG tablet Take 1 tablet by  mouth every 6 (six) hours as needed for headache.   diclofenac (VOLTAREN) 75 MG EC tablet Take 1 tablet (75 mg total) by mouth 2 (two) times daily.   fluticasone (FLONASE) 50 MCG/ACT nasal spray Place 2 sprays into both nostrils daily.   levocetirizine (XYZAL) 5 MG tablet Take 1 tablet (5 mg total) by mouth every evening.   losartan-hydrochlorothiazide (HYZAAR) 100-25 MG tablet Take 1 tablet by mouth daily.   mometasone (NASONEX) 50 MCG/ACT nasal spray 2 sprays each nostril daily     Allergies:      Augmentin [amoxicillin-pot clavulanate], Biotin, and Sulfa antibiotics   Social History: Social History   Socioeconomic History   Marital status: Married    Spouse name: Not on file   Number of children: 3   Years of education: Not on file   Highest education level: Not on file  Occupational History   Occupation: Teacher, adult education: [employer]  Social Needs   Financial resource strain: Not on file   Food insecurity    Worry: Not on file    Inability: Not on file   Transportation needs    Medical: Not on file    Non-medical: Not on file  Tobacco Use   Smoking status: Never Smoker   Smokeless tobacco: Never Used  Substance and Sexual Activity   Alcohol use: No   Drug use: No   Sexual activity: Yes    Birth control/protection: I.U.D.  Lifestyle   Physical activity    Days per week: Not on file    Minutes per session: Not on file   Stress: Not on file  Relationships   Social connections    Talks on phone: Not on file    Gets together: Not on file    Attends religious service: Not on file    Active member of club or organization: Not on file    Attends meetings of clubs or organizations: Not on file    Relationship status: Not on file  Other Topics Concern   Not on file  Social History Narrative   Not on file     Family History: The patient's family history includes Arrhythmia in her father; Breast cancer in her paternal grandmother; Colon cancer in her cousin; Diabetes in her father; Hypertension in her father and mother; Irritable bowel syndrome in her father. There is no history of Rectal cancer, Esophageal cancer, or Stomach cancer.  ROS:   All other ROS reviewed and negative. Pertinent positives noted in the HPI.     EKGs/Labs/Other Studies Reviewed:   The following studies were personally reviewed by me today:  EKG:  EKG is ordered today.  The ekg ordered today demonstrates normal sinus rhythm, heart rate 79, nonspecific ST-T changes, no  evidence of prior infarction, and was personally reviewed by me.   Recent Labs: 04/05/2019: ALT 10; BUN 12; Creatinine, Ser 0.90; Hemoglobin 13.7; Platelets 356.0; Potassium 3.8; Sodium 138   Recent Lipid Panel    Component Value Date/Time   CHOL 142 04/05/2019 1349   TRIG 73.0 04/05/2019 1349   HDL 39.80 04/05/2019 1349   CHOLHDL 4 04/05/2019 1349   VLDL 14.6 04/05/2019 1349   LDLCALC 88 04/05/2019 1349    Physical Exam:   VS:  BP (!) 162/109    Pulse 79    Temp 97.6 F (36.4 C)    Ht 5\' 4"  (1.626 m)    Wt 180 lb (81.6 kg)    SpO2 99%  BMI 30.90 kg/m    Wt Readings from Last 3 Encounters:  07/21/19 180 lb (81.6 kg)  04/05/19 178 lb (80.7 kg)  02/09/19 181 lb (82.1 kg)    General: Well nourished, well developed, in no acute distress Heart: Atraumatic, normal size  Eyes: PEERLA, EOMI  Neck: Supple, no JVD Endocrine: No thryomegaly Cardiac: Normal S1, S2; RRR; no murmurs, rubs, or gallops Lungs: Clear to auscultation bilaterally, no wheezing, rhonchi or rales  Abd: Soft, nontender, no hepatomegaly  Ext: No edema, pulses 2+ Musculoskeletal: No deformities, BUE and BLE strength normal and equal Skin: Warm and dry, no rashes   Neuro: Alert and oriented to person, place, time, and situation, CNII-XII grossly intact, no focal deficits  Psych: Normal mood and affect   ASSESSMENT:   Michiel Sitesamika W Fratus is a 43 y.o. female who presents for the following: 1. Nonspecific abnormal electrocardiogram (ECG) (EKG)   2. Essential hypertension   3. Palpitations    PLAN:   1. Nonspecific abnormal electrocardiogram (ECG) (EKG) 2. Essential hypertension -Blood pressure severely elevated today.  No symptoms.  I do not think she will be at goal with her current medication regimen.  We will add amlodipine 10 mg daily.  She will continue losartan 100, HCT 25 mg daily.  She is a Engineer, civil (consulting)nurse and can keep an eye on her blood pressure at home.  3. Palpitations -Episodic palpitations unclear etiology.  We  will check a TSH today.  Obtain echocardiogram and pursue a 7-day Zio patch.  Some of her symptoms may be anxiety related.  She does report significant stress in her life currently.  I have also asked her to limit caffeine consumption as well as to reduce her salt intake.  This will help with her blood pressure as well.  Disposition: Return in about 3 months (around 10/21/2019).  Medication Adjustments/Labs and Tests Ordered: Current medicines are reviewed at length with the patient today.  Concerns regarding medicines are outlined above.  Orders Placed This Encounter  Procedures   TSH   LONG TERM MONITOR (3-14 DAYS)   EKG 12-Lead   ECHOCARDIOGRAM COMPLETE   Meds ordered this encounter  Medications   amLODipine (NORVASC) 10 MG tablet    Sig: Take 1 tablet (10 mg total) by mouth daily.    Dispense:  90 tablet    Refill:  3    Patient Instructions  Medication Instructions:  START AMLODIPINE 10 MG ONCE DAILY  *If you need a refill on your cardiac medications before your next appointment, please call your pharmacy*  Lab Work: Your physician recommends that you HAVE LAB WORK TODAY  If you have labs (blood work) drawn today and your tests are completely normal, you will receive your results only by:  MyChart Message (if you have MyChart) OR  A paper copy in the mail If you have any lab test that is abnormal or we need to change your treatment, we will call you to review the results.  Testing/Procedures: Your physician has requested that you have an echocardiogram. Echocardiography is a painless test that uses sound waves to create images of your heart. It provides your doctor with information about the size and shape of your heart and how well your hearts chambers and valves are working. This procedure takes approximately one hour. There are no restrictions for this procedure.[address]  ZIO XT- Long Term Monitor Instructions   Your physician has requested you  wear your ZIO patch monitor____7___days.   This is  a single patch monitor.  [employer] supplies one patch monitor per enrollment.  Additional stickers are not available.   Please do not apply patch if you will be having a Nuclear Stress Test, Echocardiogram, Cardiac CT, MRI, or Chest Xray during the time frame you would be wearing the monitor. The patch cannot be worn during these tests.  You cannot remove and re-apply the ZIO XT patch monitor.   Your ZIO patch monitor will be sent USPS Priority mail from [facility] directly to your home address. The monitor may also be mailed to a PO BOX if home delivery is not available.   It may take 3-5 days to receive your monitor after you have been enrolled.   Once you have received you monitor, please review enclosed instructions.  Your monitor has already been registered assigning a specific monitor serial # to you.   Applying the monitor   Shave hair from upper left chest.   Hold abrader disc by orange tab.  Rub abrader in 40 strokes over left upper chest as indicated in your monitor instructions.   Clean area with 4 enclosed alcohol pads .  Use all pads to assure are is cleaned thoroughly.  Let dry.   Apply patch as indicated in monitor instructions.  Patch will be place under collarbone on left side of chest with arrow pointing upward.   Rub patch adhesive wings for 2 minutes.Remove white label marked "1".  Remove white label marked "2".  Rub patch adhesive wings for 2 additional minutes.   While looking in a mirror, press and release button in center of patch.  A small green light will flash 3-4 times .  This will be your only indicator the monitor has been turned on.     Do not shower for the first 24 hours.  You may shower after the first 24 hours.   Press button if you feel a symptom. You will hear a small click.  Record Date, Time and Symptom in the Patient Log Book.   When you are ready to remove patch, follow instructions on last  2 pages of Patient Log Book.  Stick patch monitor onto last page of Patient Log Book.   Place Patient Log Book in [place] and [place] box.  Use locking tab on box and tape box closed securely.  The Orange and [employer] has [employer] on it.  Please place in mailbox as soon as possible.  Your physician should have your test results approximately 7 days after the monitor has been mailed back to [facility].   Call [facility] at [phone] if you have questions regarding your ZIO XT patch monitor.  Call them immediately if you see an orange light blinking on your monitor.   If your monitor falls off in less than 4 days contact our Monitor department at [phone].  If your monitor becomes loose or falls off after 4 days call [employer] at [phone] for suggestions on securing your monitor.    Follow-Up: At [facility], you and your health needs are our priority.  As part of our continuing mission to provide you with exceptional heart care, we have created designated Provider Care Teams.  These Care Teams include your primary Cardiologist (physician) and Advanced Practice Providers (APPs -  Physician Assistants and Nurse Practitioners) who all work together to provide you with the care you need, when you need it.  Your next appointment:   3 month(s)  The format for your next  appointment:   In Person  Provider:   Lennie Odor, MD      Signed, Lenna Gilford. Flora Lipps, MD [facility]  [address], [address] [place], [place] [address] [phone]  07/21/2019 3:05 PM

## 2019-07-21 ENCOUNTER — Ambulatory Visit: Payer: No Typology Code available for payment source

## 2019-07-21 ENCOUNTER — Encounter: Payer: Self-pay | Admitting: Cardiovascular Disease

## 2019-07-21 ENCOUNTER — Ambulatory Visit
Admission: RE | Admit: 2019-07-21 | Discharge: 2019-07-21 | Disposition: A | Discharge status: Home / Self Care | Payer: No Typology Code available for payment source | Source: Ambulatory Visit | Attending: Obstetrics and Gynecology | Admitting: Obstetrics and Gynecology

## 2019-07-21 ENCOUNTER — Ambulatory Visit (INDEPENDENT_AMBULATORY_CARE_PROVIDER_SITE_OTHER): Payer: No Typology Code available for payment source | Admitting: Cardiovascular Disease

## 2019-07-21 ENCOUNTER — Other Ambulatory Visit: Payer: Self-pay

## 2019-07-21 VITALS — BP 162/109 | HR 79 | Temp 97.6°F | Ht 64.0 in | Wt 180.0 lb

## 2019-07-21 DIAGNOSIS — R002 Palpitations: Secondary | ICD-10-CM | POA: Diagnosis not present

## 2019-07-21 DIAGNOSIS — R928 Other abnormal and inconclusive findings on diagnostic imaging of breast: Secondary | ICD-10-CM

## 2019-07-21 DIAGNOSIS — R9431 Abnormal electrocardiogram [ECG] [EKG]: Secondary | ICD-10-CM

## 2019-07-21 DIAGNOSIS — I10 Essential (primary) hypertension: Secondary | ICD-10-CM | POA: Diagnosis not present

## 2019-07-21 MED ORDER — AMLODIPINE BESYLATE 10 MG PO TABS: 10.0000 mg | ORAL_TABLET | Freq: Every day | ORAL | 3 refills | Status: DC

## 2019-07-21 MED FILL — AMLODIPINE BESYLATE 10 MG T: 10 | 90 days supply | Qty: 90 | Fill #0

## 2019-07-21 NOTE — Patient Instructions (Signed)
Medication Instructions:  START AMLODIPINE 10 MG ONCE DAILY  *If you need a refill on your cardiac medications before your next appointment, please call your pharmacy*  Lab Work: Your physician recommends that you HAVE LAB WORK TODAY  If you have labs (blood work) drawn today and your tests are completely normal, you will receive your results only by: Marland Kitchen MyChart Message (if you have MyChart) OR . A paper copy in the mail If you have any lab test that is abnormal or we need to change your treatment, we will call you to review the results.  Testing/Procedures: Your physician has requested that you have an echocardiogram. Echocardiography is a painless test that uses sound waves to create images of your heart. It provides your doctor with information about the size and shape of your heart and how well your heart's chambers and valves are working. This procedure takes approximately one hour. There are no restrictions for this procedure.[address]  ZIO XT- Long Term Monitor Instructions   Your physician has requested you wear your ZIO patch monitor____7___days.   This is a single patch monitor.  [employer] supplies one patch monitor per enrollment.  Additional stickers are not available.   Please do not apply patch if you will be having a Nuclear Stress Test, Echocardiogram, Cardiac CT, MRI, or Chest Xray during the time frame you would be wearing the monitor. The patch cannot be worn during these tests.  You cannot remove and re-apply the ZIO XT patch monitor.   Your ZIO patch monitor will be sent USPS Priority mail from [facility] directly to your home address. The monitor may also be mailed to a PO BOX if home delivery is not available.   It may take 3-5 days to receive your monitor after you have been enrolled.   Once you have received you monitor, please review enclosed instructions.  Your monitor has already been registered assigning a specific monitor serial # to  you.   Applying the monitor   Shave hair from upper left chest.   Hold abrader disc by orange tab.  Rub abrader in 40 strokes over left upper chest as indicated in your monitor instructions.   Clean area with 4 enclosed alcohol pads .  Use all pads to assure are is cleaned thoroughly.  Let dry.   Apply patch as indicated in monitor instructions.  Patch will be place under collarbone on left side of chest with arrow pointing upward.   Rub patch adhesive wings for 2 minutes.Remove white label marked "1".  Remove white label marked "2".  Rub patch adhesive wings for 2 additional minutes.   While looking in a mirror, press and release button in center of patch.  A small green light will flash 3-4 times .  This will be your only indicator the monitor has been turned on.     Do not shower for the first 24 hours.  You may shower after the first 24 hours.   Press button if you feel a symptom. You will hear a small click.  Record Date, Time and Symptom in the Patient Log Book.   When you are ready to remove patch, follow instructions on last 2 pages of Patient Log Book.  Stick patch monitor onto last page of Patient Log Book.   Place Patient Log Book in [place] and [place] box.  Use locking tab on box and tape box closed securely.  The Orange and [employer] has [employer] on it.  Please place in mailbox as soon as possible.  Your physician should have your test results approximately 7 days after the monitor has been mailed back to [facility].   Call [facility] at [phone] if you have questions regarding your ZIO XT patch monitor.  Call them immediately if you see an orange light blinking on your monitor.   If your monitor falls off in less than 4 days contact our Monitor department at [phone].  If your monitor becomes loose or falls off after 4 days call [employer] at [phone] for suggestions on securing your monitor.    Follow-Up: At [facility], you and  your health needs are our priority.  As part of our continuing mission to provide you with exceptional heart care, we have created designated Provider Care Teams.  These Care Teams include your primary Cardiologist (physician) and Advanced Practice Providers (APPs -  Physician Assistants and Nurse Practitioners) who all work together to provide you with the care you need, when you need it.  Your next appointment:   3 month(s)  The format for your next appointment:   In Person  Provider:   Eleonore Chiquito, MD

## 2019-07-22 LAB — TSH: TSH: 1.49 u[IU]/mL (ref 0.450–4.500)

## 2019-07-27 ENCOUNTER — Telehealth: Payer: Self-pay

## 2019-07-27 NOTE — Telephone Encounter (Signed)
7 day ZIO XT ordered and mailed to pt. 

## 2019-08-01 ENCOUNTER — Ambulatory Visit: Payer: No Typology Code available for payment source | Admitting: Family Medicine

## 2019-08-03 ENCOUNTER — Other Ambulatory Visit: Payer: No Typology Code available for payment source

## 2019-08-04 ENCOUNTER — Ambulatory Visit (INDEPENDENT_AMBULATORY_CARE_PROVIDER_SITE_OTHER): Payer: No Typology Code available for payment source

## 2019-08-04 DIAGNOSIS — R002 Palpitations: Secondary | ICD-10-CM

## 2019-08-11 ENCOUNTER — Other Ambulatory Visit: Payer: Self-pay

## 2019-08-11 ENCOUNTER — Ambulatory Visit (HOSPITAL_COMMUNITY): Payer: No Typology Code available for payment source | Attending: Cardiology

## 2019-08-11 DIAGNOSIS — R002 Palpitations: Secondary | ICD-10-CM | POA: Diagnosis not present

## 2019-08-16 ENCOUNTER — Other Ambulatory Visit: Payer: Self-pay

## 2019-08-16 ENCOUNTER — Ambulatory Visit (INDEPENDENT_AMBULATORY_CARE_PROVIDER_SITE_OTHER): Payer: No Typology Code available for payment source | Admitting: Family Medicine

## 2019-08-16 ENCOUNTER — Encounter: Payer: Self-pay | Admitting: Family Medicine

## 2019-08-16 VITALS — BP 127/82 | HR 80 | Temp 98.5°F | Wt 182.8 lb

## 2019-08-16 DIAGNOSIS — M542 Cervicalgia: Secondary | ICD-10-CM

## 2019-08-16 DIAGNOSIS — M545 Low back pain, unspecified: Secondary | ICD-10-CM

## 2019-08-16 MED ORDER — MELOXICAM 7.5 MG PO TABS: 7.5000 mg | ORAL_TABLET | Freq: Every day | ORAL | 0 refills | Status: DC

## 2019-08-16 MED ORDER — CYCLOBENZAPRINE HCL 5 MG PO TABS: 5.0000 mg | ORAL_TABLET | Freq: Three times a day (TID) | ORAL | 1 refills | Status: DC | PRN

## 2019-08-16 MED FILL — CYCLOBENZAPRINE HCL 5 MG TA: 5 | 10 days supply | Qty: 30 | Fill #0

## 2019-08-16 MED FILL — MELOXICAM 7.5 MG TABLET: 7.5 | 30 days supply | Qty: 30 | Fill #0

## 2019-08-16 NOTE — Patient Instructions (Signed)
° ° ° °  If you have lab work done today you will be contacted with your lab results within the next 2 weeks.  If you have not heard from us then please contact us. The fastest way to get your results is to register for My Chart. ° ° °IF you received an x-ray today, you will receive an invoice from [facility]. Please contact [facility] at [phone] with questions or concerns regarding your invoice.  ° °IF you received labwork today, you will receive an invoice from [facility]. Please contact [facility] at [phone] with questions or concerns regarding your invoice.  ° °Our billing staff will not be able to assist you with questions regarding bills from these companies. ° °You will be contacted with the lab results as soon as they are available. The fastest way to get your results is to activate your My Chart account. Instructions are located on the last page of this paperwork. If you have not heard from us regarding the results in 2 weeks, please contact this office. °  ° ° ° °

## 2019-08-16 NOTE — Progress Notes (Signed)
New Patient Office Visit  Subjective:  Patient ID: [name], female    DOB: 03-May-1976  Age: 43 y.o. MRN: [id number]  CC:  Chief Complaint  Patient presents with  . Establish Care    New patient here to est care. Patient also would like to talk about her back pain post lifting something heavy 1 week ago    HPI [name] presents for   Onset: one week Pt reports that she was in the car and hit some boxes in the garage  She had to reposition the boxes which were large and heavy with furniture  The next day she had tightness in the low back After that she noticed that each day she had progressive pain Her pain today is 5/10 She already takes meloxicam as a prescription for bilateral arthritis She added extra-strength tylenol. She also used heating pads.  She reports that she notices that when she is sitting and she gets up the back locks up Also rotation of the shoulder externally and reaching behind makes the back hurt She states that she has soreness in her neck and typically gets injections in her neck.  She reports that the back pain seems to be in a band across her low back and now she is feeling pain in her hips. She is able to sleep until she changes positions.    Past Medical History:  Diagnosis Date  . Arthritis    right foot  . Asthma   . Congenital flat foot    has had bilateral foot surgery to correct  . Frequent headaches   . GERD (gastroesophageal reflux disease)   . Hypertension   . Irritable bowel syndrome (IBS)    no current med.  . Painful orthopaedic hardware [facility]) 03/2013   right foot    Past Surgical History:  Procedure Laterality Date  . CALCANEAL OSTEOTOMY Right 12/23/2012   Procedure: RIGHT CALCANEAL OSTEOTOMY, RIGHT LATERAL COLUMN LENGTHENING, RIGHT COTTON OSTEOTOMY, RIGHT FLEX DIGITORIUM LONGUS TRANSFER, RIGHT KIDNER PROCEDURE ;  Surgeon: Wylene Simmer, MD;  Location: [place];  Service: Orthopedics;  Laterality:  Right;  . CESAREAN SECTION  07/13/2005; 06/03/2006  . DILATION AND EVACUATION  08/16/2008  . FOOT SURGERY Left 2002  . FOOT SURGERY Right 10/2018  . GASTROCNEMIUS RECESSION Right 12/23/2012   Procedure: RIGHT GASTRO RECESSION ;  Surgeon: Wylene Simmer, MD;  Location: [place];  Service: Orthopedics;  Laterality: Right;  . HARDWARE REMOVAL Right 03/24/2013   Procedure: HARDWARE REMOVAL RIGHT HEEL;  Surgeon: Wylene Simmer, MD;  Location: [place];  Service: Orthopedics;  Laterality: Right;  . INTRAUTERINE DEVICE INSERTION    . WISDOM TOOTH EXTRACTION      Family History  Problem Relation Age of Onset  . Hypertension Mother   . Hypertension Father   . Diabetes Father   . Irritable bowel syndrome Father   . Arrhythmia Father   . Breast cancer Paternal Grandmother   . Colon cancer Cousin        maternal  . Rectal cancer Neg Hx   . Esophageal cancer Neg Hx   . Stomach cancer Neg Hx     Social History   Socioeconomic History  . Marital status: Married    Spouse name: Not on file  . Number of children: 3  . Years of education: Not on file  . Highest education level: Not on file  Occupational History  . Occupation: Therapist, sports  Employer: [employer]  Tobacco Use  . Smoking status: Never Smoker  . Smokeless tobacco: Never Used  Substance and Sexual Activity  . Alcohol use: No  . Drug use: No  . Sexual activity: Yes    Birth control/protection: I.U.D.  Other Topics Concern  . Not on file  Social History Narrative  . Not on file   Social Determinants of Health   Financial Resource Strain:   . Difficulty of Paying Living Expenses: Not on file  Food Insecurity:   . Worried About Programme researcher, broadcasting/film/video in the Last Year: Not on file  . Ran Out of Food in the Last Year: Not on file  Transportation Needs:   . Lack of Transportation (Medical): Not on file  . Lack of Transportation (Non-Medical): Not on file  Physical Activity:   . Days of Exercise per  Week: Not on file  . Minutes of Exercise per Session: Not on file  Stress:   . Feeling of Stress : Not on file  Social Connections:   . Frequency of Communication with Friends and Family: Not on file  . Frequency of Social Gatherings with Friends and Family: Not on file  . Attends Religious Services: Not on file  . Active Member of Clubs or Organizations: Not on file  . Attends Banker Meetings: Not on file  . Marital Status: Not on file  Intimate Partner Violence:   . Fear of Current or Ex-Partner: Not on file  . Emotionally Abused: Not on file  . Physically Abused: Not on file  . Sexually Abused: Not on file    ROS Review of Systems Review of Systems  Constitutional: Negative for activity change, appetite change, chills and fever.  HENT: Negative for congestion, nosebleeds, trouble swallowing and voice change.   Respiratory: Negative for cough, shortness of breath and wheezing.   Gastrointestinal: Negative for diarrhea, nausea and vomiting.  Genitourinary: Negative for difficulty urinating, dysuria, flank pain and hematuria.  Musculoskeletal: see hpi Neurological: Negative for dizziness, speech difficulty, light-headedness and numbness.  See HPI. All other review of systems negative.   Objective:   Today's Vitals: BP 127/82 (BP Location: Right Arm, Patient Position: Sitting, Cuff Size: Large)   Pulse 80   Temp 98.5 F (36.9 C) (Temporal)   Wt 182 lb 12.8 oz (82.9 kg)   LMP 08/04/2019   SpO2 97%   BMI 31.38 kg/m   Physical Exam Constitutional:      Appearance: Normal appearance.  HENT:     Head: Normocephalic and atraumatic.  Eyes:     Extraocular Movements: Extraocular movements intact.     Conjunctiva/sclera: Conjunctivae normal.  Cardiovascular:     Rate and Rhythm: Normal rate and regular rhythm.     Pulses: Normal pulses.     Heart sounds: No murmur.  Pulmonary:     Effort: Pulmonary effort is normal. No respiratory distress.     Breath  sounds: Normal breath sounds. No stridor. No wheezing, rhonchi or rales.  Chest:     Chest wall: No tenderness.  Musculoskeletal:     Cervical back: Normal range of motion. Spasms and tenderness present. No torticollis or bony tenderness. No pain with movement.     Thoracic back: No swelling, edema, deformity, signs of trauma, lacerations, spasms, tenderness or bony tenderness. Normal range of motion. No scoliosis.     Lumbar back: Spasms, tenderness and bony tenderness present. No swelling, edema, deformity, signs of trauma or lacerations. Normal range of motion.  No scoliosis.  Skin:    General: Skin is warm.     Capillary Refill: Capillary refill takes less than 2 seconds.  Neurological:     General: No focal deficit present.     Mental Status: She is alert and oriented to person, place, and time.  Psychiatric:        Mood and Affect: Mood normal.        Behavior: Behavior normal.        Thought Content: Thought content normal.        Judgment: Judgment normal.        Assessment & Plan:   Problem List Items Addressed This Visit    None    Visit Diagnoses    Acute midline low back pain without sciatica    -  Primary -   Discussed muscle relaxer -   Placed referral for PT -   Discussed that xray is not indicated for muscloskeletal pain   Relevant Medications   meloxicam (MOBIC) 7.5 MG tablet   cyclobenzaprine (FLEXERIL) 5 MG tablet   Other Relevant Orders   Ambulatory referral to Physical Therapy   Musculoskeletal neck pain    -  Advised pt to continue to work on neck pain with PT and NSAIDs and muscle relaxer      Outpatient Encounter Medications as of 08/16/2019  Medication Sig  . Albuterol Sulfate (PROAIR RESPICLICK) 108 (90 Base) MCG/ACT AEPB Inhale 2 puffs into the lungs every 4 (four) hours as needed.  Marland Kitchen. amLODipine (NORVASC) 10 MG tablet Take 1 tablet (10 mg total) by mouth daily.  Marland Kitchen. aspirin-acetaminophen-caffeine (EXCEDRIN MIGRAINE) 250-250-65 MG tablet Take 1  tablet by mouth every 6 (six) hours as needed for headache.  . diclofenac (VOLTAREN) 75 MG EC tablet Take 1 tablet (75 mg total) by mouth 2 (two) times daily.  . Multiple Vitamin (MULTIVITAMIN) tablet Take 1 tablet by mouth daily.  . Probiotic Product (PROBIOTIC ADVANCED) CAPS Take by mouth.  . cyclobenzaprine (FLEXERIL) 5 MG tablet Take 1 tablet (5 mg total) by mouth 3 (three) times daily as needed for muscle spasms.  . meloxicam (MOBIC) 7.5 MG tablet Take 1 tablet (7.5 mg total) by mouth daily.  . [DISCONTINUED] fluticasone (FLONASE) 50 MCG/ACT nasal spray Place 2 sprays into both nostrils daily.  . [DISCONTINUED] levocetirizine (XYZAL) 5 MG tablet Take 1 tablet (5 mg total) by mouth every evening.  . [DISCONTINUED] losartan-hydrochlorothiazide (HYZAAR) 100-25 MG tablet Take 1 tablet by mouth daily.  . [DISCONTINUED] mometasone (NASONEX) 50 MCG/ACT nasal spray 2 sprays each nostril daily   No facility-administered encounter medications on file as of 08/16/2019.    Follow-up: No follow-ups on file.   Doristine BosworthZoe A Joannah Gitlin, MD

## 2019-08-31 ENCOUNTER — Encounter: Payer: Self-pay | Admitting: Family Medicine

## 2019-09-05 ENCOUNTER — Encounter: Payer: Self-pay | Admitting: Physical Therapy

## 2019-09-05 ENCOUNTER — Other Ambulatory Visit: Payer: Self-pay

## 2019-09-05 ENCOUNTER — Ambulatory Visit: Payer: No Typology Code available for payment source | Attending: Family Medicine | Admitting: Physical Therapy

## 2019-09-05 DIAGNOSIS — R293 Abnormal posture: Secondary | ICD-10-CM | POA: Diagnosis present

## 2019-09-05 DIAGNOSIS — M542 Cervicalgia: Secondary | ICD-10-CM | POA: Insufficient documentation

## 2019-09-05 DIAGNOSIS — M545 Low back pain, unspecified: Secondary | ICD-10-CM

## 2019-09-05 NOTE — Patient Instructions (Signed)
Standing hip flexor and thomas test stretch  Upper trap and levator stretch  30 sec each x 3 x 2 per day

## 2019-09-05 NOTE — Therapy (Signed)
[place], [place], [address] Phone: [phone]   Fax:  [phone]  Physical Therapy Evaluation  Patient Details  Name: [name] MRN: [id number] Date of Birth: August 10, 1976 Referring Provider (PT): Dr. Delia Chimes    Encounter Date: 09/05/2019  PT End of Session - 09/05/19 1711    Visit Number  1    Number of Visits  16    Date for PT Re-Evaluation  11/04/19    Authorization Type  [facility] [employer]    PT Start Time  1130    PT Stop Time  1225    PT Time Calculation (min)  55 min    Activity Tolerance  Patient tolerated treatment well    Behavior During Therapy  [facility] for tasks assessed/performed       Past Medical History:  Diagnosis Date  . Arthritis    right foot  . Asthma   . Congenital flat foot    has had bilateral foot surgery to correct  . Frequent headaches   . GERD (gastroesophageal reflux disease)   . Hypertension   . Irritable bowel syndrome (IBS)    no current med.  . Painful orthopaedic hardware [facility]) 03/2013   right foot    Past Surgical History:  Procedure Laterality Date  . CALCANEAL OSTEOTOMY Right 12/23/2012   Procedure: RIGHT CALCANEAL OSTEOTOMY, RIGHT LATERAL COLUMN LENGTHENING, RIGHT COTTON OSTEOTOMY, RIGHT FLEX DIGITORIUM LONGUS TRANSFER, RIGHT KIDNER PROCEDURE ;  Surgeon: Wylene Simmer, MD;  Location: [place];  Service: Orthopedics;  Laterality: Right;  . CESAREAN SECTION  07/13/2005; 06/03/2006  . DILATION AND EVACUATION  08/16/2008  . FOOT SURGERY Left 2002  . FOOT SURGERY Right 10/2018  . GASTROCNEMIUS RECESSION Right 12/23/2012   Procedure: RIGHT GASTRO RECESSION ;  Surgeon: Wylene Simmer, MD;  Location: [place];  Service: Orthopedics;  Laterality: Right;  . HARDWARE REMOVAL Right 03/24/2013   Procedure: HARDWARE REMOVAL RIGHT HEEL;  Surgeon: Wylene Simmer, MD;  Location: [place];  Service: Orthopedics;  Laterality: Right;   . INTRAUTERINE DEVICE INSERTION    . WISDOM TOOTH EXTRACTION      There were no vitals filed for this visit.   Subjective Assessment - 09/05/19 1135    Subjective  Pt had to move heavy boxes about 3-4 weeks ago.  Began to develop back pain and then neck pain.  Denies radicular pain, red flags. She has trouble going from sit to stand, bending over (back), looking down looking up and turning her head or her trunk to L when driving.  She has learned to do a few stretches on You [employer]ube but has not had relief.    Pertinent History  none relevant    Limitations  Sitting;House hold activities    How long can you sit comfortably?  achy with tightness in R side after 30-45 min    How long can you stand comfortably?  not limited    How long can you walk comfortably?  not limited by pain    Diagnostic tests  none    Patient Stated Goals  Reduce pain    Currently in Pain?  Yes    Pain Score  6     Pain Location  Neck    Pain Orientation  Right    Pain Descriptors / Indicators  Aching;Sore    Pain Type  Acute pain    Pain Radiating Towards  superior Rt shoulder  Pain Onset  1 to 4 weeks ago    Pain Frequency  Constant    Aggravating Factors   tunring    Pain Relieving Factors  massaging it    Multiple Pain Sites  Yes    Pain Score  4    Pain Location  Back    Pain Orientation  Right;Left;Lower    Pain Descriptors / Indicators  Aching;Tightness    Pain Type  Acute pain    Pain Radiating Towards  anterior thighs (prox ) when goes from sit to stand    Pain Onset  1 to 4 weeks ago    Pain Frequency  Intermittent    Aggravating Factors   sitting    Pain Relieving Factors  moving    Effect of Pain on Daily Activities  struggles with housework, day to day activities, rolling in bed         [facility] PT Assessment - 09/05/19 0001      Assessment   Medical Diagnosis  low back and neck pain     Referring Provider (PT)  Dr. Collie Siad     Onset Date/Surgical Date  08/15/19    Prior Therapy   No       Precautions   Precautions  None      Balance Screen   Has the patient fallen in the past 6 months  Yes    How many times?  2    Has the patient had a decrease in activity level because of a fear of falling?   No    Is the patient reluctant to leave their home because of a fear of falling?   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     Leisure  movies, also has 45 and 61 yr old children, 74 yr old   has 2 grandkids      Cognition   Overall Cognitive Status  Within Functional Limits for tasks assessed      Observation/Other Assessments   Focus on Therapeutic Outcomes (FOTO)   51%      Sensation   Light Touch  Appears Intact      Posture/Postural Control   Posture/Postural Control  Postural limitations    Postural Limitations  Rounded Shoulders;Forward head;Increased lumbar lordosis;Anterior pelvic tilt    Posture Comments  genu recurvatum       AROM   Overall AROM Comments  Lumbar WFL, pain with flexion >ext     Cervical Flexion  60   pain    Cervical Extension  50   pain    Cervical - Right Side Bend  55    Cervical - Left Side Bend  50    Cervical - Right Rotation  80    Cervical - Left Rotation  80       Strength   Overall Strength Comments  WNL in LEs , shoulder flex/abd 4/5       Palpation   Spinal mobility  tender with palpation to central L3-L4, hypertonic in lumbar paraspinals bilateral , hypomobile throughout T-L junction  Objective measurements completed on examination: See above findings.      PT Education - 09/05/19 1710    Education Details  PT/POC, HEP and posture    Person(s) Educated  Patient    Methods  Explanation;Demonstration;Tactile  cues;Handout    Comprehension  Verbalized understanding;Returned demonstration       PT Short Term Goals - 09/05/19 1712      PT SHORT TERM GOAL #1   Title  Pt will be I with HEP for back and neck, including flexibility and stability    Time  4    Period  Weeks    Status  New    Target Date  10/03/19      PT SHORT TERM GOAL #2   Title  Pt will be able to turn head to drive with less pain , strain in neck and low back, improved 50%    Time  4    Period  Weeks    Status  New    Target Date  10/03/19      PT SHORT TERM GOAL #3   Title  Pt will understand sitting and standing posture and implications for pain/strain on neck and low back    Time  4    Period  Weeks    Status  New    Target Date  10/03/19      PT SHORT TERM GOAL #4   Title  Pt will be able to squat without pain in back for return to home tasks and minimize reinjury.    Time  4    Period  Weeks    Status  New    Target Date  10/03/19        PT Long Term Goals - 09/05/19 1716      PT LONG TERM GOAL #1   Title  pt will be able to show Indpendence with HEP upon last visit.    Time  8    Period  Weeks    Status  New    Target Date  10/31/19      PT LONG TERM GOAL #2   Title  Pt will be able to sleep without interruption from pain >75% of the time.    Time  6    Period  Weeks    Status  New    Target Date  10/31/19      PT LONG TERM GOAL #3   Title  Pt will be able to sit as long as needed without increased pain in back, neck for work performance.    Time  8    Period  Weeks    Status  New    Target Date  10/31/19      PT LONG TERM GOAL #4   Title  Pt will score FOTO to no more than 35% limited to demo improved function of lumbar spine    Time  8    Period  Weeks    Status  New    Target Date  10/31/19             Plan - 09/05/19 1722    Clinical Impression Statement  Patient presents for low complexity eval of neck and low back pain following an injury sustained while lifting heavy  boxes.  She has no weakness or worrisome findings.  She likely has strained muscles due to poor mechanics and lack of physical readiness.  She should do very well with PT intervention and will benefit  from corrective exercises, modalities.    Examination-Activity Limitations  Bed Mobility;Bend;Lift;Squat;Carry;Reach Overhead    Examination-Participation Restrictions  Community Activity;Driving;Shop;Laundry;Cleaning;Interpersonal Relationship    Stability/Clinical Decision Making  Stable/Uncomplicated    Clinical Decision Making  Low    Rehab Potential  Excellent    PT Frequency  2x / week    PT Duration  8 weeks    PT Treatment/Interventions  ADLs/Self Care Home Management;Electrical Stimulation;Cryotherapy;Moist Heat;Traction;Ultrasound;Therapeutic exercise;Therapeutic activities;Functional mobility training;Neuromuscular re-education;Taping;Manual techniques;Dry needling;Passive range of motion    PT Next Visit Plan  core, manual to Rt upper trap, DN?    PT Home Exercise Plan  hip flexor stretching and upper trap and levator scap stretching    Consulted and Agree with Plan of Care  Patient       Patient will benefit from skilled therapeutic intervention in order to improve the following deficits and impairments:  Decreased mobility, Increased muscle spasms, Hypomobility, Increased fascial restricitons, Impaired UE functional use, Postural dysfunction, Pain, Impaired flexibility  Visit Diagnosis: Cervicalgia  Acute bilateral low back pain without sciatica  Abnormal posture     Problem List Patient Active Problem List   Diagnosis Date Noted  . Chronic cough 09/27/2018  . Cervical spondylosis 07/11/2018  . Lymphocytic colitis 05/21/2018  . Anxiety state 04/29/2016  . Atypical chest pain 02/22/2016  . Dysphagia, pharyngoesophageal phase 02/22/2016  . Physical exam 12/15/2014  . Left hip pain 09/29/2014  . Radicular low back pain 09/11/2014  . Bilateral hip bursitis 09/11/2014  .  BPV (benign positional vertigo) 02/22/2014  . HTN (hypertension) 02/22/2014    PAA,JENNIFER 09/05/2019, 5:36 PM  [facility] [address] [place], [place], [address] Phone: [phone]   Fax:  [phone]  Name: ABRIE EGLOFF MRN: [id number] Date of Birth: [birth year]   Karie Mainland, PT 09/05/19 5:36 PM Phone: [phone] Fax: [phone]

## 2019-09-15 ENCOUNTER — Other Ambulatory Visit: Payer: Self-pay

## 2019-09-15 ENCOUNTER — Encounter: Payer: Self-pay | Admitting: Physical Therapy

## 2019-09-15 ENCOUNTER — Ambulatory Visit: Payer: No Typology Code available for payment source | Attending: Family Medicine | Admitting: Physical Therapy

## 2019-09-15 DIAGNOSIS — R293 Abnormal posture: Secondary | ICD-10-CM | POA: Diagnosis present

## 2019-09-15 DIAGNOSIS — M542 Cervicalgia: Secondary | ICD-10-CM | POA: Diagnosis present

## 2019-09-15 DIAGNOSIS — M545 Low back pain, unspecified: Secondary | ICD-10-CM

## 2019-09-15 NOTE — Patient Instructions (Signed)

## 2019-09-15 NOTE — Therapy (Addendum)
[facility] [address] [place], [place], [address] Phone: [phone]   Fax:  [phone]  Physical Therapy Treatment/Discharge  Patient Details  Name: [name] MRN: [id number] Date of Birth: [birth year] Referring Provider (PT): Dr. Collie Siad    Encounter Date: 09/15/2019  PT End of Session - 09/15/19 0921    Visit Number  2    Number of Visits  16    Date for PT Re-Evaluation  11/04/19    Authorization Type  [facility] [employer]    PT Start Time  0844    PT Stop Time  0928    PT Time Calculation (min)  44 min    Activity Tolerance  Patient tolerated treatment well    Behavior During Therapy  [facility] for tasks assessed/performed       Past Medical History:  Diagnosis Date  . Arthritis    right foot  . Asthma   . Congenital flat foot    has had bilateral foot surgery to correct  . Frequent headaches   . GERD (gastroesophageal reflux disease)   . Hypertension   . Irritable bowel syndrome (IBS)    no current med.  . Painful orthopaedic hardware [facility]) 03/2013   right foot    Past Surgical History:  Procedure Laterality Date  . CALCANEAL OSTEOTOMY Right 12/23/2012   Procedure: RIGHT CALCANEAL OSTEOTOMY, RIGHT LATERAL COLUMN LENGTHENING, RIGHT COTTON OSTEOTOMY, RIGHT FLEX DIGITORIUM LONGUS TRANSFER, RIGHT KIDNER PROCEDURE ;  Surgeon: Toni Arthurs, MD;  Location: [facility];  Service: Orthopedics;  Laterality: Right;  . CESAREAN SECTION  07/13/2005; 06/03/2006  . DILATION AND EVACUATION  08/16/2008  . FOOT SURGERY Left 2002  . FOOT SURGERY Right 10/2018  . GASTROCNEMIUS RECESSION Right 12/23/2012   Procedure: RIGHT GASTRO RECESSION ;  Surgeon: Toni Arthurs, MD;  Location: [facility];  Service: Orthopedics;  Laterality: Right;  . HARDWARE REMOVAL Right 03/24/2013   Procedure: HARDWARE REMOVAL RIGHT HEEL;  Surgeon: Toni Arthurs, MD;  Location: [facility];  Service: Orthopedics;   Laterality: Right;  . INTRAUTERINE DEVICE INSERTION    . WISDOM TOOTH EXTRACTION      There were no vitals filed for this visit.  Subjective Assessment - 09/15/19 0843    Subjective  Feeling some benefit from HEP but still noting mild to moderate pain neck and low back with neck worse than back.    Currently in Pain?  Yes    Pain Score  4     Pain Location  Neck    Pain Orientation  Right    Pain Descriptors / Indicators  Aching;Sore    Pain Type  Acute pain    Pain Onset  1 to 4 weeks ago    Pain Frequency  Constant    Aggravating Factors   turning head    Pain Relieving Factors  massage    Pain Score  2    Pain Location  Back    Pain Orientation  Right;Lower    Pain Descriptors / Indicators  Aching;Tightness    Pain Type  Acute pain    Pain Onset  1 to 4 weeks ago    Pain Frequency  Intermittent    Aggravating Factors   sitting    Pain Relieving Factors  movement                       [facility] Adult PT Treatment/Exercise - 09/15/19 0001  Lumbar Exercises: Stretches   Lower Trunk Rotation  5 reps    Pelvic Tilt  10 reps    Press Ups Limitations  prone press up on elbows x 10    Other Lumbar Stretch Exercise  "child's pose" stretch 20 sec x 3 with hands to side ea. side bilat. to target right anfd left lumbar paraspinals respectively      Lumbar Exercises: Supine   Bent Knee Raise  15 reps    Bridge  10 reps      Lumbar Exercises: Quadruped   Straight Leg Raise  10 reps      Manual Therapy   Manual Therapy  Soft tissue mobilization    Soft tissue mobilization  STM right upper trapezius region, right>left lumbar region       Trigger Point Dry Needling - 09/15/19 0001    Consent Given?  Yes    Education Handout Provided  Yes    Muscles Treated Head and Neck  Upper trapezius;Levator scapulae    Muscles Treated Back/Hip  Erector spinae    Dry Needling Comments  needling performed in prone with 32 gauge 30 mm needles for neck region and 32 gauge 50  mm needles used for lumbar region (needled at L2-3 levels longissimus    Electrical Stimulation Performed with Dry Needling  Yes    E-stim with Dry Needling Details  TENS 20 pps x 10 minutes           PT Education - 09/15/19 0921    Education Details  dry needling, exercises    Person(s) Educated  Patient    Methods  Explanation;Demonstration;Verbal cues;Handout    Comprehension  Returned demonstration;Verbalized understanding       PT Short Term Goals - 09/05/19 1712      PT SHORT TERM GOAL #1   Title  Pt will be I with HEP for back and neck, including flexibility and stability    Time  4    Period  Weeks    Status  New    Target Date  10/03/19      PT SHORT TERM GOAL #2   Title  Pt will be able to turn head to drive with less pain , strain in neck and low back, improved 50%    Time  4    Period  Weeks    Status  New    Target Date  10/03/19      PT SHORT TERM GOAL #3   Title  Pt will understand sitting and standing posture and implications for pain/strain on neck and low back    Time  4    Period  Weeks    Status  New    Target Date  10/03/19      PT SHORT TERM GOAL #4   Title  Pt will be able to squat without pain in back for return to home tasks and minimize reinjury.    Time  4    Period  Weeks    Status  New    Target Date  10/03/19        PT Long Term Goals - 09/05/19 1716      PT LONG TERM GOAL #1   Title  pt will be able to show Indpendence with HEP upon last visit.    Time  8    Period  Weeks    Status  New    Target Date  10/31/19      PT LONG TERM  GOAL #2   Title  Pt will be able to sleep without interruption from pain >75% of the time.    Time  6    Period  Weeks    Status  New    Target Date  10/31/19      PT LONG TERM GOAL #3   Title  Pt will be able to sit as long as needed without increased pain in back, neck for work performance.    Time  8    Period  Weeks    Status  New    Target Date  10/31/19      PT LONG TERM GOAL #4    Title  Pt will score FOTO to no more than 35% limited to demo improved function of lumbar spine    Time  8    Period  Weeks    Status  New    Target Date  10/31/19            Plan - 09/15/19 6546    Clinical Impression Statement  Tx. focus manual to neck and back as well as exercises focused for lumbar region for ROM/stretches and light core strengthening. Also trial dry needling today which was well-tolerated. Expect may take 1-2 days to note results so will await further tx. response by next visit.    Examination-Activity Limitations  Bed Mobility;Bend;Lift;Squat;Carry;Reach Overhead    Examination-Participation Restrictions  Community Activity;Driving;Shop;Laundry;Cleaning;Interpersonal Relationship    Stability/Clinical Decision Making  Stable/Uncomplicated    Clinical Decision Making  Low    Rehab Potential  Excellent    PT Frequency  2x / week    PT Duration  8 weeks    PT Treatment/Interventions  ADLs/Self Care Home Management;Electrical Stimulation;Cryotherapy;Moist Heat;Traction;Ultrasound;Therapeutic exercise;Therapeutic activities;Functional mobility training;Neuromuscular re-education;Taping;Manual techniques;Dry needling;Passive range of motion    PT Next Visit Plan  check response dry needling and continue as found beneficial, manual to right upper trap region and lumbar region, continue core strengthening progression    PT Home Exercise Plan  hip flexor stretching and upper trap and levator scap stretching    Consulted and Agree with Plan of Care  Patient       Patient will benefit from skilled therapeutic intervention in order to improve the following deficits and impairments:  Decreased mobility, Increased muscle spasms, Hypomobility, Increased fascial restricitons, Impaired UE functional use, Postural dysfunction, Pain, Impaired flexibility  Visit Diagnosis: Cervicalgia  Acute bilateral low back pain without sciatica  Abnormal posture     Problem  List Patient Active Problem List   Diagnosis Date Noted  . Chronic cough 09/27/2018  . Cervical spondylosis 07/11/2018  . Lymphocytic colitis 05/21/2018  . Anxiety state 04/29/2016  . Atypical chest pain 02/22/2016  . Dysphagia, pharyngoesophageal phase 02/22/2016  . Physical exam 12/15/2014  . Left hip pain 09/29/2014  . Radicular low back pain 09/11/2014  . Bilateral hip bursitis 09/11/2014  . BPV (benign positional vertigo) 02/22/2014  . HTN (hypertension) 02/22/2014   Beaulah Dinning, PT, DPT 09/15/19 9:24 AM  [facility] [address] [place], [place], [address] Phone: [phone]   Fax:  [phone]  Name: [name] MRN: [id number] Date of Birth: June 18, 1976   10/04/19: Patient called to say she did not think PT is needed anymore.  DC from PT after 2 visits.  See above for most recent goals.   Raeford Razor, PT 10/04/19 2:03 PM Phone: [phone] Fax: [phone]

## 2019-09-21 ENCOUNTER — Ambulatory Visit: Payer: No Typology Code available for payment source | Admitting: Physical Therapy

## 2019-09-27 ENCOUNTER — Ambulatory Visit: Payer: No Typology Code available for payment source | Admitting: Physical Therapy

## 2019-09-29 ENCOUNTER — Ambulatory Visit: Payer: No Typology Code available for payment source | Admitting: Physical Therapy

## 2019-10-04 MED FILL — CYCLOBENZAPRINE HCL 5 MG TA: 5 | 10 days supply | Qty: 30 | Fill #1

## 2019-10-17 ENCOUNTER — Telehealth: Payer: Self-pay | Admitting: Physical Medicine and Rehabilitation

## 2019-10-18 NOTE — Telephone Encounter (Signed)
Left message #1

## 2019-10-18 NOTE — Telephone Encounter (Signed)
ok 

## 2019-10-21 NOTE — Telephone Encounter (Signed)
Called patient again to schedule. Mailbox is full.

## 2019-10-23 NOTE — Progress Notes (Deleted)
Cardiology Office Note:   Date:  10/23/2019  NAME:  [name]    MRN: [id number] DOB:  1976-04-25   PCP:  Doristine Bosworth, MD  Cardiologist:  No primary care provider on file.  Electrophysiologist:  None   Referring MD: Sheliah Hatch, MD   No chief complaint on file. ***  History of Present Illness:   [name] is a 44 y.o. female with a hx of arthritis, HTN who presents for follow-up of palpitations. Zio patch without arrhythmia and rare ectopy.   Past Medical History: Past Medical History:  Diagnosis Date  . Arthritis    right foot  . Asthma   . Congenital flat foot    has had bilateral foot surgery to correct  . Frequent headaches   . GERD (gastroesophageal reflux disease)   . Hypertension   . Irritable bowel syndrome (IBS)    no current med.  . Painful orthopaedic hardware [facility]) 03/2013   right foot    Past Surgical History: Past Surgical History:  Procedure Laterality Date  . CALCANEAL OSTEOTOMY Right 12/23/2012   Procedure: RIGHT CALCANEAL OSTEOTOMY, RIGHT LATERAL COLUMN LENGTHENING, RIGHT COTTON OSTEOTOMY, RIGHT FLEX DIGITORIUM LONGUS TRANSFER, RIGHT KIDNER PROCEDURE ;  Surgeon: Toni Arthurs, MD;  Location: [facility];  Service: Orthopedics;  Laterality: Right;  . CESAREAN SECTION  07/13/2005; 06/03/2006  . DILATION AND EVACUATION  08/16/2008  . FOOT SURGERY Left 2002  . FOOT SURGERY Right 10/2018  . GASTROCNEMIUS RECESSION Right 12/23/2012   Procedure: RIGHT GASTRO RECESSION ;  Surgeon: Toni Arthurs, MD;  Location: [facility];  Service: Orthopedics;  Laterality: Right;  . HARDWARE REMOVAL Right 03/24/2013   Procedure: HARDWARE REMOVAL RIGHT HEEL;  Surgeon: Toni Arthurs, MD;  Location: [facility];  Service: Orthopedics;  Laterality: Right;  . INTRAUTERINE DEVICE INSERTION    . WISDOM TOOTH EXTRACTION      Current Medications: No outpatient medications have been marked as taking for the 10/24/19 encounter  (Appointment) with O'Neal, Ronnald Ramp, MD.     Allergies:    Augmentin [amoxicillin-pot clavulanate], Biotin, and Sulfa antibiotics   Social History: Social History   Socioeconomic History  . Marital status: Married    Spouse name: Not on file  . Number of children: 3  . Years of education: Not on file  . Highest education level: Not on file  Occupational History  . Occupation: Teacher, adult education: [employer]  Tobacco Use  . Smoking status: Never Smoker  . Smokeless tobacco: Never Used  Substance and Sexual Activity  . Alcohol use: No  . Drug use: No  . Sexual activity: Yes    Birth control/protection: I.U.D.  Other Topics Concern  . Not on file  Social History Narrative  . Not on file   Social Determinants of Health   Financial Resource Strain:   . Difficulty of Paying Living Expenses: Not on file  Food Insecurity:   . Worried About Programme researcher, broadcasting/film/video in the Last Year: Not on file  . Ran Out of Food in the Last Year: Not on file  Transportation Needs:   . Lack of Transportation (Medical): Not on file  . Lack of Transportation (Non-Medical): Not on file  Physical Activity:   . Days of Exercise per Week: Not on file  . Minutes of Exercise per Session: Not on file  Stress:   . Feeling of Stress : Not on file  Social Connections:   .  Frequency of Communication with Friends and Family: Not on file  . Frequency of Social Gatherings with Friends and Family: Not on file  . Attends Religious Services: Not on file  . Active Member of Clubs or Organizations: Not on file  . Attends Banker Meetings: Not on file  . Marital Status: Not on file     Family History: The patient's ***family history includes Arrhythmia in her father; Breast cancer in her paternal grandmother; Colon cancer in her cousin; Diabetes in her father; Hypertension in her father and mother; Irritable bowel syndrome in her father. There is no history of Rectal cancer, Esophageal cancer, or  Stomach cancer.  ROS:   All other ROS reviewed and negative. Pertinent positives noted in the HPI.     EKGs/Labs/Other Studies Reviewed:   The following studies were personally reviewed by me today:  EKG:  EKG is *** ordered today.  The ekg ordered today demonstrates ***, and was personally reviewed by me.   Zio 09/02/2019 1. No arrhythmias detected.  2. No atrial fibrillation.  3. Rare ectopy.   TTE 08/11/2019 1. Left ventricular ejection fraction, by visual estimation, is 55 to  60%. The left ventricle has normal function. There is mildly increased  left ventricular hypertrophy. Normal diastolic function.  2. Left ventricular ejection fraction by 3D volume is is 58 %.  3. The average left ventricular global longitudinal strain is -16.5 %.  4. Global right ventricle has normal systolic function.The right  ventricular size is normal. No increase in right ventricular wall  thickness.  5. Left atrial size was normal.  6. Right atrial size was normal.  7. The mitral valve is normal in structure. No evidence of mitral valve  regurgitation.  8. The tricuspid valve is normal in structure. Tricuspid valve  regurgitation is trivial.  9. The aortic valve was not well visualized. Aortic valve regurgitation  is not visualized. No evidence of aortic valve sclerosis or stenosis.  10. The pulmonic valve was not well visualized. Pulmonic valve  regurgitation is not visualized.   Recent Labs: 04/05/2019: ALT 10; BUN 12; Creatinine, Ser 0.90; Hemoglobin 13.7; Platelets 356.0; Potassium 3.8; Sodium 138 07/21/2019: TSH 1.490   Recent Lipid Panel    Component Value Date/Time   CHOL 142 04/05/2019 1349   TRIG 73.0 04/05/2019 1349   HDL 39.80 04/05/2019 1349   CHOLHDL 4 04/05/2019 1349   VLDL 14.6 04/05/2019 1349   LDLCALC 88 04/05/2019 1349    Physical Exam:   VS:  There were no vitals taken for this visit.   Wt Readings from Last 3 Encounters:  08/16/19 182 lb 12.8 oz (82.9 kg)    07/21/19 180 lb (81.6 kg)  04/05/19 178 lb (80.7 kg)    General: Well nourished, well developed, in no acute distress Heart: Atraumatic, normal size  Eyes: PEERLA, EOMI  Neck: Supple, no JVD Endocrine: No thryomegaly Cardiac: Normal S1, S2; RRR; no murmurs, rubs, or gallops Lungs: Clear to auscultation bilaterally, no wheezing, rhonchi or rales  Abd: Soft, nontender, no hepatomegaly  Ext: No edema, pulses 2+ Musculoskeletal: No deformities, BUE and BLE strength normal and equal Skin: Warm and dry, no rashes   Neuro: Alert and oriented to person, place, time, and situation, CNII-XII grossly intact, no focal deficits  Psych: Normal mood and affect   ASSESSMENT:   [name] is a 44 y.o. female who presents for the following: No diagnosis found.  PLAN:   There are no diagnoses linked  to this encounter.  Disposition: No follow-ups on file.  Medication Adjustments/Labs and Tests Ordered: Current medicines are reviewed at length with the patient today.  Concerns regarding medicines are outlined above.  No orders of the defined types were placed in this encounter.  No orders of the defined types were placed in this encounter.   There are no Patient Instructions on file for this visit.   Signed, Addison Naegeli. Audie Box, [place]  [address] [place], [place] [address] [phone]  10/23/2019 3:20 PM

## 2019-10-24 ENCOUNTER — Ambulatory Visit: Payer: No Typology Code available for payment source | Admitting: Cardiovascular Disease

## 2019-10-24 NOTE — Telephone Encounter (Signed)
Patient is scheduled for 3/10. She wants to know if there is anything she can do in the meantime to help lessen the pain. She has been applying ice and taking tylenol and ibuprofen.

## 2019-10-24 NOTE — Telephone Encounter (Signed)
Her meds say diclofenac 75mg  and meleoxicam 7.5mg  is she taking either one of those? Also she can do 2 tylenol ES TID.

## 2019-10-24 NOTE — Telephone Encounter (Signed)
Patient states that she is not currently taking either of those. She states that she does not have any Diclofenac and asks if you could refill that.

## 2019-10-25 ENCOUNTER — Other Ambulatory Visit: Payer: Self-pay | Admitting: Physical Medicine and Rehabilitation

## 2019-10-25 MED ORDER — DICLOFENAC SODIUM 75 MG PO TBEC: 75.0000 mg | DELAYED_RELEASE_TABLET | Freq: Two times a day (BID) | ORAL | 0 refills | Status: DC

## 2019-10-25 MED FILL — DICLOFENAC SODIUM 75 MG TAB: 75 | 30 days supply | Qty: 60 | Fill #0

## 2019-10-25 NOTE — Telephone Encounter (Signed)
done

## 2019-11-07 ENCOUNTER — Telehealth: Payer: Self-pay | Admitting: *Deleted

## 2019-11-08 ENCOUNTER — Other Ambulatory Visit: Payer: Self-pay | Admitting: Physical Medicine and Rehabilitation

## 2019-11-08 DIAGNOSIS — F411 Generalized anxiety disorder: Secondary | ICD-10-CM

## 2019-11-08 MED ORDER — DIAZEPAM 5 MG PO TABS: ORAL_TABLET | ORAL | 0 refills | Status: DC

## 2019-11-08 MED FILL — diazePAM 5 MG TABS: 5 | 2 days supply | Qty: 2 | Fill #0

## 2019-11-08 NOTE — Progress Notes (Signed)
Pre-procedure diazepam ordered for pre-operative anxiety.  

## 2019-11-08 NOTE — Telephone Encounter (Signed)
Called pt and advised.  

## 2019-11-08 NOTE — Telephone Encounter (Signed)
Done

## 2019-11-09 ENCOUNTER — Other Ambulatory Visit: Payer: Self-pay

## 2019-11-09 ENCOUNTER — Ambulatory Visit: Payer: Self-pay

## 2019-11-09 ENCOUNTER — Encounter: Payer: Self-pay | Admitting: Physical Medicine and Rehabilitation

## 2019-11-09 ENCOUNTER — Ambulatory Visit (INDEPENDENT_AMBULATORY_CARE_PROVIDER_SITE_OTHER): Payer: No Typology Code available for payment source | Admitting: Physical Medicine and Rehabilitation

## 2019-11-09 VITALS — BP 151/108 | HR 86

## 2019-11-09 DIAGNOSIS — M5412 Radiculopathy, cervical region: Secondary | ICD-10-CM

## 2019-11-09 DIAGNOSIS — M5010 Cervical disc disorder with radiculopathy, unspecified cervical region: Secondary | ICD-10-CM | POA: Diagnosis not present

## 2019-11-09 MED ORDER — METHYLPREDNISOLONE ACETATE 80 MG/ML IJ SUSP
40.0000 mg | Freq: Once | INTRAMUSCULAR | Status: AC
  Administered 2019-11-09: 40 mg

## 2019-11-09 NOTE — Progress Notes (Signed)
Right sided neck pain starting to radiate into right shoulder. Diclofenac  Numeric Pain Rating Scale and Functional Assessment Average Pain 5   In the last MONTH (on 0-10 scale) has pain interfered with the following?  1. General activity like being  able to carry out your everyday physical activities such as walking, climbing stairs, carrying groceries, or moving a chair?  Rating(5)   +Driver, -BT, -Dye Allergies.

## 2019-11-10 NOTE — Progress Notes (Signed)
[name] - 44 y.o. female MRN [id number]  Date of birth: 05/28/1976  Office Visit Note: Visit Date: 11/09/2019 PCP: Forrest Moron, MD Referred by: Forrest Moron, MD  Subjective: Chief Complaint  Patient presents with  . Neck - Pain   HPI:  [name] is a 44 y.o. female who comes in today For repeat C7-T1 interlaminar epidural steroid injection.  Patient has had 2 prior C7-T1 injections both with really good relief lasting almost 6 months.  She also has been using some diclofenac which seems to help.  The pain radiates from the right neck and to the right shoulder.  She has failed conservative care otherwise.  MRI reviewed again.  Since I last seen her no real new issues or trauma just exacerbation of current condition.  She has been undergoing physical therapy as well.  Last injection did help significantly that was in May 2020  ROS Otherwise per HPI.  Assessment & Plan: Visit Diagnoses:  1. Cervical radiculopathy   2. Cervical disc disorder with radiculopathy     Plan: No additional findings.   Meds & Orders:  Meds ordered this encounter  Medications  . methylPREDNISolone acetate (DEPO-MEDROL) injection 40 mg    Orders Placed This Encounter  Procedures  . XR C-ARM NO REPORT  . Epidural Steroid injection    Follow-up: Return if symptoms worsen or fail to improve.   Procedures: No procedures performed  Cervical Epidural Steroid Injection - Interlaminar Approach with Fluoroscopic Guidance  Patient: [name]      Date of Birth: 04-Sep-1975 MRN: [id number] PCP: Forrest Moron, MD      Visit Date: 11/09/2019   Universal Protocol:    Date/Time: 03/11/215:50 AM  Consent Given By: the patient  Position: PRONE  Additional Comments: Vital signs were monitored before and after the procedure. Patient was prepped and draped in the usual sterile fashion. The correct patient, procedure, and site was verified.   Injection Procedure Details:    Procedure Site One Meds Administered:  Meds ordered this encounter  Medications  . methylPREDNISolone acetate (DEPO-MEDROL) injection 40 mg     Laterality: Right  Location/Site: C7-T1  Needle size: 20 G  Needle type: Touhy  Needle Placement: Paramedian epidural space  Findings:  -Comments: Excellent flow of contrast into the epidural space.  Procedure Details: Using a paramedian approach from the side mentioned above, the region overlying the inferior lamina was localized under fluoroscopic visualization and the soft tissues overlying this structure were infiltrated with 4 ml. of 1% Lidocaine without Epinephrine. A # 20 gauge, Tuohy needle was inserted into the epidural space using a paramedian approach.  The epidural space was localized using loss of resistance along with lateral and contralateral oblique bi-planar fluoroscopic views.  After negative aspirate for air, blood, and CSF, a 2 ml. volume of Isovue-250 was injected into the epidural space and the flow of contrast was observed. Radiographs were obtained for documentation purposes.   The injectate was administered into the level noted above.  Additional Comments:  The patient tolerated the procedure well Dressing: 2 x 2 sterile gauze and Band-Aid    Post-procedure details: Patient was observed during the procedure. Post-procedure instructions were reviewed.  Patient left the clinic in stable condition.     Clinical History: MRI CERVICAL SPINE WITHOUT CONTRAST  TECHNIQUE: Multiplanar, multisequence MR imaging of the cervical spine was performed. No intravenous contrast was administered.  COMPARISON:  None.  FINDINGS: The patient was unable  to remain motionless for the exam. Small or subtle lesions could be overlooked.  Alignment: Anatomic  Vertebrae: No fracture, evidence of discitis, or bone lesion.  Cord: Normal signal and morphology. No significant stenosis or compression.  Posterior  Fossa, vertebral arteries, paraspinal tissues: Negative.  Disc levels:  C2-3:  Normal.  C3-4: Central protrusion with osseous spurring. Effacement anterior subarachnoid space. RIGHT greater than LEFT C4 foraminal narrowing is compounded by facet arthropathy.  C4-5:  Tiny central protrusion.  No impingement.  C5-6:  Slight disc desiccation.  Facet arthropathy.  No impingement.  C6-7:  Normal interspace.  C7-T1:  Mild facet arthropathy.  Normal disc space.  No impingement.  IMPRESSION: Mild multilevel spondylosis. Central protrusion with osseous spurring at C3-4 in conjunction with facet arthropathy may be the symptomatic level. BILATERAL C4 foraminal narrowing is observed.   Electronically Signed   By: Elsie Stain M.D.   On: 05/26/2018 14:58     Objective:  VS:  HT:    WT:   BMI:     BP:(!) 151/108  HR:86bpm  TEMP: ( )  RESP:  Physical Exam  Ortho Exam Imaging: XR C-ARM NO REPORT  Result Date: 11/09/2019 Please see Notes tab for imaging impression.

## 2019-11-10 NOTE — Procedures (Signed)
Cervical Epidural Steroid Injection - Interlaminar Approach with Fluoroscopic Guidance  Patient: [name]      Date of Birth: Nov 02, 1975 MRN: [id number] PCP: Doristine Bosworth, MD      Visit Date: 11/09/2019   Universal Protocol:    Date/Time: 03/11/215:50 AM  Consent Given By: the patient  Position: PRONE  Additional Comments: Vital signs were monitored before and after the procedure. Patient was prepped and draped in the usual sterile fashion. The correct patient, procedure, and site was verified.   Injection Procedure Details:  Procedure Site One Meds Administered:  Meds ordered this encounter  Medications  . methylPREDNISolone acetate (DEPO-MEDROL) injection 40 mg     Laterality: Right  Location/Site: C7-T1  Needle size: 20 G  Needle type: Touhy  Needle Placement: Paramedian epidural space  Findings:  -Comments: Excellent flow of contrast into the epidural space.  Procedure Details: Using a paramedian approach from the side mentioned above, the region overlying the inferior lamina was localized under fluoroscopic visualization and the soft tissues overlying this structure were infiltrated with 4 ml. of 1% Lidocaine without Epinephrine. A # 20 gauge, Tuohy needle was inserted into the epidural space using a paramedian approach.  The epidural space was localized using loss of resistance along with lateral and contralateral oblique bi-planar fluoroscopic views.  After negative aspirate for air, blood, and CSF, a 2 ml. volume of Isovue-250 was injected into the epidural space and the flow of contrast was observed. Radiographs were obtained for documentation purposes.   The injectate was administered into the level noted above.  Additional Comments:  The patient tolerated the procedure well Dressing: 2 x 2 sterile gauze and Band-Aid    Post-procedure details: Patient was observed during the procedure. Post-procedure instructions were reviewed.  Patient  left the clinic in stable condition.

## 2019-11-21 ENCOUNTER — Encounter: Payer: Self-pay | Admitting: Cardiovascular Disease

## 2019-12-14 ENCOUNTER — Encounter: Payer: Self-pay | Admitting: Family Medicine

## 2019-12-14 ENCOUNTER — Ambulatory Visit (INDEPENDENT_AMBULATORY_CARE_PROVIDER_SITE_OTHER): Payer: No Typology Code available for payment source | Admitting: Family Medicine

## 2019-12-14 ENCOUNTER — Other Ambulatory Visit: Payer: Self-pay

## 2019-12-14 VITALS — BP 154/88 | HR 66 | Temp 98.2°F | Resp 16 | Ht 64.0 in | Wt 183.2 lb

## 2019-12-14 DIAGNOSIS — F1590 Other stimulant use, unspecified, uncomplicated: Secondary | ICD-10-CM

## 2019-12-14 DIAGNOSIS — E559 Vitamin D deficiency, unspecified: Secondary | ICD-10-CM | POA: Diagnosis not present

## 2019-12-14 DIAGNOSIS — I10 Essential (primary) hypertension: Secondary | ICD-10-CM

## 2019-12-14 DIAGNOSIS — K580 Irritable bowel syndrome with diarrhea: Secondary | ICD-10-CM | POA: Diagnosis not present

## 2019-12-14 DIAGNOSIS — Z0001 Encounter for general adult medical examination with abnormal findings: Secondary | ICD-10-CM

## 2019-12-14 DIAGNOSIS — D72819 Decreased white blood cell count, unspecified: Secondary | ICD-10-CM | POA: Diagnosis not present

## 2019-12-14 DIAGNOSIS — Z0000 Encounter for general adult medical examination without abnormal findings: Secondary | ICD-10-CM

## 2019-12-14 MED ORDER — LOSARTAN POTASSIUM 50 MG PO TABS: 50.0000 mg | ORAL_TABLET | Freq: Every day | ORAL | 3 refills | Status: DC

## 2019-12-14 MED FILL — LOSARTAN POTASSIUM 50 MG TA: 50 | 90 days supply | Qty: 90 | Fill #0

## 2019-12-14 NOTE — Progress Notes (Signed)
Chief Complaint  Patient presents with  . Annual Exam    cpe no pap    Subjective:  [name] is a 44 y.o. female here for a health maintenance visit.  Patient is established pt  She drinks 5 Dr. Malachi Bonds sodas a day.  She reports that she has IBS-D  She has diarrhea 15-30 minutes after eating She drinks that much coffee due to her sense of fatigue.  She states that she drinks 5 hour energy drinks.   She has a history of night sweats, weight gain  Wt Readings from Last 3 Encounters:  12/14/19 183 lb 3.2 oz (83.1 kg)  08/16/19 182 lb 12.8 oz (82.9 kg)  07/21/19 180 lb (81.6 kg)      Patient Active Problem List   Diagnosis Date Noted  . Chronic cough 09/27/2018  . Cervical spondylosis 07/11/2018  . Lymphocytic colitis 05/21/2018  . Anxiety state 04/29/2016  . Atypical chest pain 02/22/2016  . Dysphagia, pharyngoesophageal phase 02/22/2016  . Physical exam 12/15/2014  . Left hip pain 09/29/2014  . Radicular low back pain 09/11/2014  . Bilateral hip bursitis 09/11/2014  . BPV (benign positional vertigo) 02/22/2014  . HTN (hypertension) 02/22/2014    Past Medical History:  Diagnosis Date  . Arthritis    right foot  . Asthma   . Congenital flat foot    has had bilateral foot surgery to correct  . Frequent headaches   . GERD (gastroesophageal reflux disease)   . Hypertension   . Irritable bowel syndrome (IBS)    no current med.  . Painful orthopaedic hardware [facility]) 03/2013   right foot    Past Surgical History:  Procedure Laterality Date  . CALCANEAL OSTEOTOMY Right 12/23/2012   Procedure: RIGHT CALCANEAL OSTEOTOMY, RIGHT LATERAL COLUMN LENGTHENING, RIGHT COTTON OSTEOTOMY, RIGHT FLEX DIGITORIUM LONGUS TRANSFER, RIGHT KIDNER PROCEDURE ;  Surgeon: Wylene Simmer, MD;  Location: [place];  Service: Orthopedics;  Laterality: Right;  . CESAREAN SECTION  07/13/2005; 06/03/2006  . DILATION AND EVACUATION  08/16/2008  . FOOT SURGERY Left 2002  . FOOT  SURGERY Right 10/2018  . GASTROCNEMIUS RECESSION Right 12/23/2012   Procedure: RIGHT GASTRO RECESSION ;  Surgeon: Wylene Simmer, MD;  Location: [place];  Service: Orthopedics;  Laterality: Right;  . HARDWARE REMOVAL Right 03/24/2013   Procedure: HARDWARE REMOVAL RIGHT HEEL;  Surgeon: Wylene Simmer, MD;  Location: [place];  Service: Orthopedics;  Laterality: Right;  . INTRAUTERINE DEVICE INSERTION    . WISDOM TOOTH EXTRACTION       Outpatient Medications Prior to Visit  Medication Sig Dispense Refill  . acetaminophen (TYLENOL) 500 MG tablet Take 1,000 mg by mouth every 6 (six) hours as needed.    Marland Kitchen aspirin-acetaminophen-caffeine (EXCEDRIN MIGRAINE) 250-250-65 MG tablet Take 1 tablet by mouth every 6 (six) hours as needed for headache.    . cetirizine (ZYRTEC) 10 MG tablet Take 10 mg by mouth daily.    . cyclobenzaprine (FLEXERIL) 5 MG tablet Take 1 tablet (5 mg total) by mouth 3 (three) times daily as needed for muscle spasms. 30 tablet 1  . diazepam (VALIUM) 5 MG tablet Take 1 by mouth 1 hour  pre-procedure with very light food. May bring 2nd tablet to appointment. 2 tablet 0  . diclofenac (VOLTAREN) 75 MG EC tablet Take 1 tablet (75 mg total) by mouth 2 (two) times daily. 60 tablet 0  . Multiple Vitamin (MULTIVITAMIN) tablet Take 1 tablet by mouth daily.    Marland Kitchen  Probiotic Product (PROBIOTIC ADVANCED) CAPS Take by mouth.    . Albuterol Sulfate (PROAIR RESPICLICK) 161 (90 Base) MCG/ACT AEPB Inhale 2 puffs into the lungs every 4 (four) hours as needed. (Patient not taking: Reported on 09/05/2019) 1 each 1  . amLODipine (NORVASC) 10 MG tablet Take 1 tablet (10 mg total) by mouth daily. 90 tablet 3  . meloxicam (MOBIC) 7.5 MG tablet Take 1 tablet (7.5 mg total) by mouth daily. (Patient not taking: Reported on 12/14/2019) 30 tablet 0   No facility-administered medications prior to visit.    Allergies  Allergen Reactions  . Augmentin [Amoxicillin-Pot Clavulanate] Nausea  And Vomiting  . Biotin Other (See Comments)    Severe yeast infection  . Sulfa Antibiotics Other (See Comments)    Reaction:  Muscle pain      Family History  Problem Relation Age of Onset  . Hypertension Mother   . Hypertension Father   . Diabetes Father   . Irritable bowel syndrome Father   . Arrhythmia Father   . Breast cancer Paternal Grandmother   . Colon cancer Cousin        maternal  . Rectal cancer Neg Hx   . Esophageal cancer Neg Hx   . Stomach cancer Neg Hx      Social History   Socioeconomic History  . Marital status: Married    Spouse name: Not on file  . Number of children: 3  . Years of education: Not on file  . Highest education level: Not on file  Occupational History  . Occupation: Programmer, multimedia: [employer]  Tobacco Use  . Smoking status: Never Smoker  . Smokeless tobacco: Never Used  Substance and Sexual Activity  . Alcohol use: No  . Drug use: No  . Sexual activity: Yes    Birth control/protection: I.U.D.  Other Topics Concern  . Not on file  Social History Narrative  . Not on file   Social Determinants of Health   Financial Resource Strain:   . Difficulty of Paying Living Expenses:   Food Insecurity:   . Worried About Charity fundraiser in the Last Year:   . Arboriculturist in the Last Year:   Transportation Needs:   . Film/video editor (Medical):   Marland Kitchen Lack of Transportation (Non-Medical):   Physical Activity:   . Days of Exercise per Week:   . Minutes of Exercise per Session:   Stress:   . Feeling of Stress :   Social Connections:   . Frequency of Communication with Friends and Family:   . Frequency of Social Gatherings with Friends and Family:   . Attends Religious Services:   . Active Member of Clubs or Organizations:   . Attends Archivist Meetings:   Marland Kitchen Marital Status:   Intimate Partner Violence:   . Fear of Current or Ex-Partner:   . Emotionally Abused:   Marland Kitchen Physically Abused:   . Sexually Abused:     Social History   Substance and Sexual Activity  Alcohol Use No   Social History   Tobacco Use  Smoking Status Never Smoker  Smokeless Tobacco Never Used   Social History   Substance and Sexual Activity  Drug Use No    GYN: Sexual Health Menstrual status: regular menses LMP: No LMP recorded. (Menstrual status: IUD). Last pap smear: see HM section History of abnormal pap smears:  Sexually active: ** with ** partner Current contraception:   Health Maintenance: See  under health Maintenance activity for review of completion dates as well. Immunization History  Administered Date(s) Administered  . Hepatitis B, adult 08/29/2015, 09/26/2015, 02/06/2016  . Influenza, Seasonal, Injecte, Preservative Fre 06/30/2019  . Influenza,inj,Quad PF,6+ Mos 06/01/2014, 06/07/2015, 05/18/2017, 05/28/2018  . Tdap 04/05/2019      Depression Screen-PHQ2/9 Depression screen [facility] 2/9 12/14/2019 08/16/2019 04/05/2019 03/31/2018 10/19/2017  Decreased Interest 0 0 0 0 0  Down, Depressed, Hopeless 0 0 2 0 0  PHQ - 2 Score 0 0 2 0 0  Altered sleeping - - 1 0 0  Tired, decreased energy - - 1 0 0  Change in appetite - - 0 0 0  Feeling bad or failure about yourself  - - 0 0 0  Trouble concentrating - - 0 0 0  Moving slowly or fidgety/restless - - 0 0 0  Suicidal thoughts - - 0 0 0  PHQ-9 Score - - 4 0 0  Difficult doing work/chores - - Somewhat difficult Not difficult at all -       Depression Severity and Treatment Recommendations:  0-4= None  5-9= Mild / Treatment: Support, educate to call if worse; return in one month  10-14= Moderate / Treatment: Support, watchful waiting; Antidepressant or Psycotherapy  15-19= Moderately severe / Treatment: Antidepressant OR Psychotherapy  >= 20 = Major depression, severe / Antidepressant AND Psychotherapy    Review of Systems   ROS  See HPI for ROS as well.   Review of Systems  Constitutional: Negative for activity change, appetite change,  chills and fever.  HENT: Negative for congestion, nosebleeds, trouble swallowing and voice change.   Respiratory: Negative for cough, shortness of breath and wheezing.   Gastrointestinal: +diarrhea, no nausea and vomiting.  Genitourinary: Negative for difficulty urinating, dysuria, flank pain and hematuria.  Musculoskeletal: Negative for back pain, joint swelling and neck pain.  Neurological: Negative for dizziness, speech difficulty, light-headedness and numbness.  See HPI. All other review of systems negative.   Objective:   Vitals:   12/14/19 1126 12/14/19 1205  BP: (!) 177/104 (!) 154/88  Pulse: 66   Resp: 16   Temp: 98.2 F (36.8 C)   TempSrc: Temporal   SpO2: 99%   Weight: 183 lb 3.2 oz (83.1 kg)   Height: 5' 4" (1.626 m)     Body mass index is 31.45 kg/m.  Physical Exam  Physical Exam  Constitutional: Oriented to person, place, and time. Appears well-developed and well-nourished.  HENT:  Head: Normocephalic and atraumatic.  Eyes: Conjunctivae and EOM are normal.  Cardiovascular: Normal rate, regular rhythm, normal heart sounds and intact distal pulses.  No murmur heard. Pulmonary/Chest: Effort normal and breath sounds normal. No stridor. No respiratory distress. Has no wheezes.  Neurological: Is alert and oriented to person, place, and time.  Skin: Skin is warm. Capillary refill takes less than 2 seconds.  Psychiatric: Has a normal mood and affect. Behavior is normal. Judgment and thought content normal.     Assessment/Plan:   Patient was seen for a health maintenance exam.  Counseled the patient on health maintenance issues. Reviewed her health mainteance schedule and ordered appropriate tests (see orders.) Counseled on regular exercise and weight management. Recommend regular eye exams and dental cleaning.   The following issues were addressed today for health maintenance:   Charae was seen today for annual exam.  Diagnoses and all orders for this  visit:  Encounter for health maintenance examination in adult - mammogram and pap smear up to  date Advised monthly breast exam and annual mammogram Advised dental exam every six months Discussed stress management Discussed pap smear screening guidelines    Vitamin D deficiency - will reassess -     VITAMIN D 25 Hydroxy (Vit-D Deficiency, Fractures)  Essential hypertension  -  Patient's blood pressure is NOT at goal of 139/89 or less. Added losartan to improve blood pressure management.  I also recommend a balanced diet with fruits and vegetables every day, lean meats, and little fried foods. The DASH diet (you can find this online) is a good example of this.  -     CMP14+EGFR -     Lipid panel -     losartan (COZAAR) 50 MG tablet; Take 1 tablet (50 mg total) by mouth daily.  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    Body mass index is 31.45 kg/m.:  Discussed the patient's BMI with patient. The BMI body mass index is 31.45 kg/m.     No future appointments.  Patient Instructions       If you have lab work done today you will be contacted with your lab results within the next 2 weeks.  If you have not heard from [place] then please contact us. The fastest way to get your results is to register for My Chart.   IF you received an x-ray today, you will receive an invoice from [facility]. Please contact [facility] at [phone] with questions or concerns regarding your invoice.   IF you received labwork today, you will receive an invoice from [place]. Please contact [facility] at [phone] with questions or concerns regarding your invoice.   Our billing staff will not be able to assist you with questions regarding bills  from these companies.  You will be contacted with the lab results as soon as they are available. The fastest way to get your results is to activate your My Chart account. Instructions are located on the last page of this paperwork. If you have not heard from [place] regarding the results in 2 weeks, please contact this office.     Caffeine Use Disorder  Having caffeine use disorder means that a person cannot control how much caffeine he or she consumes. Caffeine is a drug that is found in many foods and beverages, such as coffee, tea, chocolate, and energy drinks. Caffeine is also found in some medicines and over-the-counter products like alertness tablets or weight loss pills. Caffeine may be bad for your health when you have too much. People with caffeine use disorder continue to consume caffeine even though they know it may be causing problems with their physical or mental health. What are the causes? This condition is caused by consuming too much caffeine over time. What increases the risk? You are more likely to develop this condition if:  You have more than 400 mg of caffeine--or 4 cups of coffee--each day.  You have a history or family history of a substance use disorder or alcohol abuse.  You have a psychiatric or mental health disorder, or you have been treated for one. What are the signs or symptoms? Symptoms of this condition include:  Finding it hard to get through the day without caffeine. Caffeine is hurting your ability to function.  Wanting to  cut down on (limit) your caffeine intake but finding it hard to do so.  Having withdrawal symptoms within 24 hours of not having caffeine. Withdrawal symptoms may include: ? Headaches. ? Feeling tired (fatigued) or sleepy. ? Feeling irritable, angry, or depressed. ? Not being able to focus. ? Feeling like you have the flu.  Having trouble falling asleep because of caffeine. You may also fall asleep when you are not supposed to--such  as at work or school--whenever you do not have enough caffeine.  Continuing to use caffeine even though it harms your body or emotions. You are concerned that your high caffeine intake is bad for your health. How is this diagnosed? This condition may be diagnosed with an assessment by your health care provider. During the assessment, your health care provider may:  Ask questions about how much caffeine you consume each day.  Try to determine if your caffeine intake is harming your health. Some health problems that may be related to caffeine use include: ? Heart or stomach problems. ? Fibrocystic breast disease. ? Stress. ? Being unable to sleep (insomnia). ? Urinary issues.  Ask questions about why you find it hard to quit consuming caffeine or why you have had trouble quitting in the past. How is this treated? Treatment for this condition involves reducing your intake of caffeine over time. Your health care provider will discuss ways to do this and how to manage caffeine withdrawal symptoms. He or she may help you figure out the best goal for how much caffeine you should consume on a regular basis--for example, whether you should cut down to one soda a day or none at all. In some cases, your health care provider may recommend counseling to help treat your caffeine use disorder. Follow these instructions at home:  Follow instructions from your health care provider about how to cut down on caffeine. This may involve: ? Slowly reducing your intake of caffeine. For example, this may mean gradually having fewer cups of coffee each day--cutting out one more cup each day--until you are able to have only one cup daily. ? Mixing a caffeinated soda with a decaf (decaffeinated) soda. ? Replacing coffee, tea, or soda with a decaf drink.  Do not stop having caffeine all at once. Doing that may cause severe withdrawal symptoms.  Find ways to reduce stress, such as by: ? Meditating. ? Being more  active. ? Using deep breathing exercises.  Contact a health care provider if:  You cannot cut down on caffeine.  Withdrawal symptoms get worse or they do not go away. Get help right away if:  You have severe caffeine withdrawal symptoms, such as vomiting or depression. Summary  Having caffeine use disorder means that a person cannot control how much caffeine he or she consumes.  Symptoms of caffeine use disorder include wanting to limit (cut down on) your caffeine intake but finding it hard to do so.  Follow instructions from your health care provider about how to cut down on caffeine. This information is not intended to replace advice given to you by your health care provider. Make sure you discuss any questions you have with your health care provider. Document Revised: 07/31/2017 Document Reviewed: 12/11/2016 Elsevier Patient Education  2020 [employer].

## 2019-12-14 NOTE — Patient Instructions (Addendum)
If you have lab work done today you will be contacted with your lab results within the next 2 weeks.  If you have not heard from [place] then please contact us. The fastest way to get your results is to register for My Chart.   IF you received an x-ray today, you will receive an invoice from [facility]. Please contact [facility] at [phone] with questions or concerns regarding your invoice.   IF you received labwork today, you will receive an invoice from [place]. Please contact [facility] at [phone] with questions or concerns regarding your invoice.   Our billing staff will not be able to assist you with questions regarding bills from these companies.  You will be contacted with the lab results as soon as they are available. The fastest way to get your results is to activate your My Chart account. Instructions are located on the last page of this paperwork. If you have not heard from [place] regarding the results in 2 weeks, please contact this office.     Caffeine Use Disorder  Having caffeine use disorder means that a person cannot control how much caffeine he or she consumes. Caffeine is a drug that is found in many foods and beverages, such as coffee, tea, chocolate, and energy drinks. Caffeine is also found in some medicines and over-the-counter products like alertness tablets or weight loss pills. Caffeine may be bad for your health when you have too much. People with caffeine use disorder continue to consume caffeine even though they know it may be causing problems with their physical or mental health. What are the causes? This condition is caused by consuming too much caffeine over time. What increases the risk? You are more likely to develop this condition if:  You have more than 400 mg of caffeine--or 4 cups of coffee--each day.  You have a history or family history of a substance use disorder or alcohol abuse.  You have a psychiatric or mental health  disorder, or you have been treated for one. What are the signs or symptoms? Symptoms of this condition include:  Finding it hard to get through the day without caffeine. Caffeine is hurting your ability to function.  Wanting to cut down on (limit) your caffeine intake but finding it hard to do so.  Having withdrawal symptoms within 24 hours of not having caffeine. Withdrawal symptoms may include: ? Headaches. ? Feeling tired (fatigued) or sleepy. ? Feeling irritable, angry, or depressed. ? Not being able to focus. ? Feeling like you have the flu.  Having trouble falling asleep because of caffeine. You may also fall asleep when you are not supposed to--such as at work or school--whenever you do not have enough caffeine.  Continuing to use caffeine even though it harms your body or emotions. You are concerned that your high caffeine intake is bad for your health. How is this diagnosed? This condition may be diagnosed with an assessment by your health care provider. During the assessment, your health care provider may:  Ask questions about how much caffeine you consume each day.  Try to determine if your caffeine intake is harming your health. Some health problems that may be related to caffeine use include: ? Heart or stomach problems. ? Fibrocystic breast disease. ? Stress. ? Being unable to sleep (insomnia). ? Urinary issues.  Ask questions about why you find it hard to quit consuming caffeine or why you have had trouble quitting in the past. How is this  treated? Treatment for this condition involves reducing your intake of caffeine over time. Your health care provider will discuss ways to do this and how to manage caffeine withdrawal symptoms. He or she may help you figure out the best goal for how much caffeine you should consume on a regular basis--for example, whether you should cut down to one soda a day or none at all. In some cases, your health care provider may recommend  counseling to help treat your caffeine use disorder. Follow these instructions at home:  Follow instructions from your health care provider about how to cut down on caffeine. This may involve: ? Slowly reducing your intake of caffeine. For example, this may mean gradually having fewer cups of coffee each day--cutting out one more cup each day--until you are able to have only one cup daily. ? Mixing a caffeinated soda with a decaf (decaffeinated) soda. ? Replacing coffee, tea, or soda with a decaf drink.  Do not stop having caffeine all at once. Doing that may cause severe withdrawal symptoms.  Find ways to reduce stress, such as by: ? Meditating. ? Being more active. ? Using deep breathing exercises.  Contact a health care provider if:  You cannot cut down on caffeine.  Withdrawal symptoms get worse or they do not go away. Get help right away if:  You have severe caffeine withdrawal symptoms, such as vomiting or depression. Summary  Having caffeine use disorder means that a person cannot control how much caffeine he or she consumes.  Symptoms of caffeine use disorder include wanting to limit (cut down on) your caffeine intake but finding it hard to do so.  Follow instructions from your health care provider about how to cut down on caffeine. This information is not intended to replace advice given to you by your health care provider. Make sure you discuss any questions you have with your health care provider. Document Revised: 07/31/2017 Document Reviewed: 12/11/2016 Elsevier Patient Education  2020 [employer].

## 2019-12-15 LAB — CBC
Hematocrit: 39 % (ref 34.0–46.6)
Hemoglobin: 13.3 g/dL (ref 11.1–15.9)
MCH: 29.6 pg (ref 26.6–33.0)
MCHC: 34.1 g/dL (ref 31.5–35.7)
MCV: 87 fL (ref 79–97)
Platelets: 371 10*3/uL (ref 150–450)
RBC: 4.5 x10E6/uL (ref 3.77–5.28)
RDW: 12.7 % (ref 11.7–15.4)
WBC: 3.2 10*3/uL — ABNORMAL LOW (ref 3.4–10.8)

## 2019-12-15 LAB — CMP14+EGFR
ALT: 10 IU/L (ref 0–32)
AST: 15 IU/L (ref 0–40)
Albumin/Globulin Ratio: 1.5 (ref 1.2–2.2)
Albumin: 4.3 g/dL (ref 3.8–4.8)
Alkaline Phosphatase: 62 IU/L (ref 39–117)
BUN/Creatinine Ratio: 5 — ABNORMAL LOW (ref 9–23)
BUN: 5 mg/dL — ABNORMAL LOW (ref 6–24)
Bilirubin Total: 0.2 mg/dL (ref 0.0–1.2)
CO2: 22 mmol/L (ref 20–29)
Calcium: 8.9 mg/dL (ref 8.7–10.2)
Chloride: 103 mmol/L (ref 96–106)
Creatinine, Ser: 0.96 mg/dL (ref 0.57–1.00)
GFR calc Af Amer: 83 mL/min/{1.73_m2} (ref >59)
GFR calc non Af Amer: 72 mL/min/{1.73_m2} (ref >59)
Globulin, Total: 2.9 g/dL (ref 1.5–4.5)
Glucose: 84 mg/dL (ref 65–99)
Potassium: 4.5 mmol/L (ref 3.5–5.2)
Sodium: 137 mmol/L (ref 134–144)
Total Protein: 7.2 g/dL (ref 6.0–8.5)

## 2019-12-15 LAB — LIPID PANEL
Chol/HDL Ratio: 3.1 ratio (ref 0.0–4.4)
Cholesterol, Total: 142 mg/dL (ref 100–199)
HDL: 46 mg/dL (ref >39)
LDL Chol Calc (NIH): 81 mg/dL (ref 0–99)
Triglycerides: 76 mg/dL (ref 0–149)
VLDL Cholesterol Cal: 15 mg/dL (ref 5–40)

## 2019-12-15 LAB — VITAMIN D 25 HYDROXY (VIT D DEFICIENCY, FRACTURES): Vit D, 25-Hydroxy: 19.3 ng/mL — ABNORMAL LOW (ref 30.0–100.0)

## 2020-01-02 ENCOUNTER — Telehealth (INDEPENDENT_AMBULATORY_CARE_PROVIDER_SITE_OTHER): Payer: No Typology Code available for payment source | Admitting: Registered Nurse

## 2020-01-02 ENCOUNTER — Other Ambulatory Visit: Payer: Self-pay

## 2020-01-02 ENCOUNTER — Encounter: Payer: Self-pay | Admitting: Family Medicine

## 2020-01-02 DIAGNOSIS — F411 Generalized anxiety disorder: Secondary | ICD-10-CM | POA: Diagnosis not present

## 2020-01-02 MED ORDER — ESCITALOPRAM OXALATE 5 MG PO TABS: 5.0000 mg | ORAL_TABLET | Freq: Every day | ORAL | 0 refills | Status: DC

## 2020-01-02 MED ORDER — ESCITALOPRAM OXALATE 10 MG PO TABS: 10.0000 mg | ORAL_TABLET | Freq: Every day | ORAL | 0 refills | Status: DC

## 2020-01-02 MED ORDER — ALPRAZOLAM 0.25 MG PO TBDP: 0.2500 mg | ORAL_TABLET | Freq: Three times a day (TID) | ORAL | 0 refills | Status: DC | PRN

## 2020-01-02 MED FILL — ESCITALOPRAM 5 MG TABLET: 5 | 7 days supply | Qty: 7 | Fill #0

## 2020-01-02 MED FILL — ALPRAZolam 0.25 MG TBDP: 0.25 | 10 days supply | Qty: 30 | Fill #0

## 2020-01-02 MED FILL — ESCITALOPRAM 10 MG TABLET: 10 | 90 days supply | Qty: 90 | Fill #0

## 2020-01-02 NOTE — Patient Instructions (Signed)
° ° ° °  If you have lab work done today you will be contacted with your lab results within the next 2 weeks.  If you have not heard from us then please contact us. The fastest way to get your results is to register for My Chart. ° ° °IF you received an x-ray today, you will receive an invoice from [facility]. Please contact [facility] at [phone] with questions or concerns regarding your invoice.  ° °IF you received labwork today, you will receive an invoice from [facility]. Please contact [facility] at [phone] with questions or concerns regarding your invoice.  ° °Our billing staff will not be able to assist you with questions regarding bills from these companies. ° °You will be contacted with the lab results as soon as they are available. The fastest way to get your results is to activate your My Chart account. Instructions are located on the last page of this paperwork. If you have not heard from us regarding the results in 2 weeks, please contact this office. °  ° ° ° °

## 2020-01-13 ENCOUNTER — Ambulatory Visit: Payer: No Typology Code available for payment source | Admitting: Family Medicine

## 2020-02-05 ENCOUNTER — Encounter: Payer: Self-pay | Admitting: Registered Nurse

## 2020-02-05 NOTE — Progress Notes (Addendum)
Established Patient Office Visit  Telemedicine Encounter- SOAP NOTE Established Patient  This telephone encounter was conducted with the patient's (or proxy's) verbal consent via audio telecommunications: yes  Patient was instructed to have this encounter in a suitably private space; and to only have persons present to whom they give permission to participate. In addition, patient identity was confirmed by use of name plus two identifiers (DOB and address).  I discussed the limitations, risks, security and privacy concerns of performing an evaluation and management service by telephone and the availability of in person appointments. I also discussed with the patient that there may be a patient responsible charge related to this service. The patient expressed understanding and agreed to proceed.  I spent a total of 13 minutes talking with the patient or their proxy.  Subjective:  Patient ID: [name], female    DOB: 11-29-1975  Age: 44 y.o. MRN: [id number]  CC:  Chief Complaint  Patient presents with  . Anxiety    pt says that when she is sleeping she is awakened by overwhelming  anxious feeling. This is something new she is experiencing. Also having having palpitiations. Gad 7 and Phq 9 completed    HPI [name] presents for anxiety  Mostly at nighttime. Finds that she wakes with overwhelming anxiety that prevents her from going back to sleep. She often has to get out of bed for a while No clear cause. Has been worsening for some time Denies snoring or apneic episodes. Denies hi/si.  Has not been treated for anxiety in the past.   Past Medical History:  Diagnosis Date  . Arthritis    right foot  . Asthma   . Congenital flat foot    has had bilateral foot surgery to correct  . Frequent headaches   . GERD (gastroesophageal reflux disease)   . Hypertension   . Irritable bowel syndrome (IBS)    no current med.  . Painful orthopaedic hardware [facility]) 03/2013    right foot    Past Surgical History:  Procedure Laterality Date  . CALCANEAL OSTEOTOMY Right 12/23/2012   Procedure: RIGHT CALCANEAL OSTEOTOMY, RIGHT LATERAL COLUMN LENGTHENING, RIGHT COTTON OSTEOTOMY, RIGHT FLEX DIGITORIUM LONGUS TRANSFER, RIGHT KIDNER PROCEDURE ;  Surgeon: Toni Arthurs, MD;  Location: [facility];  Service: Orthopedics;  Laterality: Right;  . CESAREAN SECTION  07/13/2005; 06/03/2006  . DILATION AND EVACUATION  08/16/2008  . FOOT SURGERY Left 2002  . FOOT SURGERY Right 10/2018  . GASTROCNEMIUS RECESSION Right 12/23/2012   Procedure: RIGHT GASTRO RECESSION ;  Surgeon: Toni Arthurs, MD;  Location: [facility];  Service: Orthopedics;  Laterality: Right;  . HARDWARE REMOVAL Right 03/24/2013   Procedure: HARDWARE REMOVAL RIGHT HEEL;  Surgeon: Toni Arthurs, MD;  Location: [facility];  Service: Orthopedics;  Laterality: Right;  . INTRAUTERINE DEVICE INSERTION    . WISDOM TOOTH EXTRACTION      Family History  Problem Relation Age of Onset  . Hypertension Mother   . Hypertension Father   . Diabetes Father   . Irritable bowel syndrome Father   . Arrhythmia Father   . Breast cancer Paternal Grandmother   . Colon cancer Cousin        maternal  . Rectal cancer Neg Hx   . Esophageal cancer Neg Hx   . Stomach cancer Neg Hx     Social History   Socioeconomic History  . Marital status: Married    Spouse name: Not  on file  . Number of children: 3  . Years of education: Not on file  . Highest education level: Not on file  Occupational History  . Occupation: Teacher, adult education: [employer]  Tobacco Use  . Smoking status: Never Smoker  . Smokeless tobacco: Never Used  Substance and Sexual Activity  . Alcohol use: No  . Drug use: No  . Sexual activity: Yes    Birth control/protection: I.U.D.  Other Topics Concern  . Not on file  Social History Narrative  . Not on file   Social Determinants of Health   Financial Resource Strain:     . Difficulty of Paying Living Expenses:   Food Insecurity:   . Worried About Programme researcher, broadcasting/film/video in the Last Year:   . Barista in the Last Year:   Transportation Needs:   . Freight forwarder (Medical):   Marland Kitchen Lack of Transportation (Non-Medical):   Physical Activity:   . Days of Exercise per Week:   . Minutes of Exercise per Session:   Stress:   . Feeling of Stress :   Social Connections:   . Frequency of Communication with Friends and Family:   . Frequency of Social Gatherings with Friends and Family:   . Attends Religious Services:   . Active Member of Clubs or Organizations:   . Attends Banker Meetings:   Marland Kitchen Marital Status:   Intimate Partner Violence:   . Fear of Current or Ex-Partner:   . Emotionally Abused:   Marland Kitchen Physically Abused:   . Sexually Abused:     Outpatient Medications Prior to Visit  Medication Sig Dispense Refill  . acetaminophen (TYLENOL) 500 MG tablet Take 1,000 mg by mouth every 6 (six) hours as needed.    . Albuterol Sulfate (PROAIR RESPICLICK) 108 (90 Base) MCG/ACT AEPB Inhale 2 puffs into the lungs every 4 (four) hours as needed. 1 each 1  . aspirin-acetaminophen-caffeine (EXCEDRIN MIGRAINE) 250-250-65 MG tablet Take 1 tablet by mouth every 6 (six) hours as needed for headache.    . cetirizine (ZYRTEC) 10 MG tablet Take 10 mg by mouth daily.    . diclofenac (VOLTAREN) 75 MG EC tablet Take 1 tablet (75 mg total) by mouth 2 (two) times daily. 60 tablet 0  . meloxicam (MOBIC) 7.5 MG tablet Take 1 tablet (7.5 mg total) by mouth daily. 30 tablet 0  . Probiotic Product (PROBIOTIC ADVANCED) CAPS Take by mouth.    Marland Kitchen amLODipine (NORVASC) 10 MG tablet Take 1 tablet (10 mg total) by mouth daily. 90 tablet 3  . cyclobenzaprine (FLEXERIL) 5 MG tablet Take 1 tablet (5 mg total) by mouth 3 (three) times daily as needed for muscle spasms. 30 tablet 1  . diazepam (VALIUM) 5 MG tablet Take 1 by mouth 1 hour  pre-procedure with very light food. May  bring 2nd tablet to appointment. 2 tablet 0  . losartan (COZAAR) 50 MG tablet Take 1 tablet (50 mg total) by mouth daily. 90 tablet 3  . Multiple Vitamin (MULTIVITAMIN) tablet Take 1 tablet by mouth daily.     No facility-administered medications prior to visit.    Allergies  Allergen Reactions  . Augmentin [Amoxicillin-Pot Clavulanate] Nausea And Vomiting  . Biotin Other (See Comments)    Severe yeast infection  . Sulfa Antibiotics Other (See Comments)    Reaction:  Muscle pain     ROS Review of Systems  Constitutional: Negative.   HENT: Negative.  Eyes: Negative.   Respiratory: Negative.   Cardiovascular: Negative.   Gastrointestinal: Negative.   Endocrine: Negative.   Genitourinary: Negative.   Musculoskeletal: Negative.   Skin: Negative.   Allergic/Immunologic: Negative.   Neurological: Negative.   Hematological: Negative.   Psychiatric/Behavioral: Positive for sleep disturbance. Negative for agitation, behavioral problems, confusion, decreased concentration, dysphoric mood, hallucinations, self-injury and suicidal ideas. The patient is nervous/anxious. The patient is not hyperactive.   All other systems reviewed and are negative.     Objective:    Physical Exam  Constitutional: She is oriented to person, place, and time. She appears well-developed and well-nourished. No distress.  Cardiovascular: Normal rate and regular rhythm.  Pulmonary/Chest: Effort normal. No respiratory distress.  Neurological: She is alert and oriented to person, place, and time.  Skin: Skin is warm and dry. No rash noted. She is not diaphoretic. No erythema. No pallor.  Psychiatric: She has a normal mood and affect. Her behavior is normal. Judgment and thought content normal.  Nursing note and vitals reviewed.   There were no vitals taken for this visit. Wt Readings from Last 3 Encounters:  12/14/19 183 lb 3.2 oz (83.1 kg)  08/16/19 182 lb 12.8 oz (82.9 kg)  07/21/19 180 lb (81.6 kg)       Health Maintenance Due  Topic Date Due  . COVID-19 Vaccine (1) Never done    There are no preventive care reminders to display for this patient.  Lab Results  Component Value Date   TSH 1.490 07/21/2019   Lab Results  Component Value Date   WBC 3.2 (L) 12/14/2019   HGB 13.3 12/14/2019   HCT 39.0 12/14/2019   MCV 87 12/14/2019   PLT 371 12/14/2019   Lab Results  Component Value Date   NA 137 12/14/2019   K 4.5 12/14/2019   CO2 22 12/14/2019   GLUCOSE 84 12/14/2019   BUN 5 (L) 12/14/2019   CREATININE 0.96 12/14/2019   BILITOT 0.2 12/14/2019   ALKPHOS 62 12/14/2019   AST 15 12/14/2019   ALT 10 12/14/2019   PROT 7.2 12/14/2019   ALBUMIN 4.3 12/14/2019   CALCIUM 8.9 12/14/2019   ANIONGAP 8 01/18/2018   GFR 82.31 04/05/2019   Lab Results  Component Value Date   CHOL 142 12/14/2019   Lab Results  Component Value Date   HDL 46 12/14/2019   Lab Results  Component Value Date   LDLCALC 81 12/14/2019   Lab Results  Component Value Date   TRIG 76 12/14/2019   Lab Results  Component Value Date   CHOLHDL 3.1 12/14/2019   Lab Results  Component Value Date   HGBA1C 5.6 04/05/2019      Assessment & Plan:   Problem List Items Addressed This Visit    None    Visit Diagnoses    Generalized anxiety disorder    -  Primary   Relevant Medications   escitalopram (LEXAPRO) 5 MG tablet   escitalopram (LEXAPRO) 10 MG tablet   ALPRAZolam (NIRAVAM) 0.25 MG dissolvable tablet      Meds ordered this encounter  Medications  . escitalopram (LEXAPRO) 5 MG tablet    Sig: Take 1 tablet (5 mg total) by mouth daily.    Dispense:  7 tablet    Refill:  0    Order Specific Question:   Supervising Provider    Answer:   Delia Chimes A [id number]  . escitalopram (LEXAPRO) 10 MG tablet    Sig: Take 1 tablet (10 mg  total) by mouth daily.    Dispense:  90 tablet    Refill:  0    Order Specific Question:   Supervising Provider    Answer:   Collie Siad A [id number]   . ALPRAZolam (NIRAVAM) 0.25 MG dissolvable tablet    Sig: Take 1 tablet (0.25 mg total) by mouth 3 (three) times daily as needed for anxiety.    Dispense:  30 tablet    Refill:  0    Order Specific Question:   Supervising Provider    Answer:   Doristine Bosworth [id number]    Follow-up: No follow-ups on file.   PLAN  Start escitalopram 5mg  PO qd for one week, then increase to 10mg  PO qd.  Med check in 4 - 6 weeks  Alprazolam 0.25mg  sublingual po qhs when anxiety breaks through.  Suggest counseling and coping mechanisms - pt will consider  Patient encouraged to call clinic with any questions, comments, or concerns.  I discussed the assessment and treatment plan with the patient. The patient was provided an opportunity to ask questions and all were answered. The patient agreed with the plan and demonstrated an understanding of the instructions.  The patient was advised to call back or seek an in-person evaluation if the symptoms worsen or if the condition fails to improve as anticipated.  I provided 13 minutes of non-face-to-face time during this encounter. , NP

## 2020-02-19 MED FILL — AMLODIPINE BESYLATE 10 MG T: 10 | 90 days supply | Qty: 90 | Fill #1

## 2020-04-02 ENCOUNTER — Telehealth: Payer: Self-pay | Admitting: Physical Medicine and Rehabilitation

## 2020-04-02 NOTE — Telephone Encounter (Signed)
Is auth needed for [address]? 

## 2020-04-02 NOTE — Telephone Encounter (Signed)
ok 

## 2020-04-02 NOTE — Telephone Encounter (Signed)
Patient called needing an appointment for a neck injection with Dr Alvester Morin. The number to contact patient is [phone]

## 2020-04-02 NOTE — Telephone Encounter (Signed)
Right C7-T1 IL 11/09/19. Ok to repeat if helped, same problem/side, and no new injury?

## 2020-04-12 ENCOUNTER — Telehealth: Payer: Self-pay | Admitting: Physical Medicine and Rehabilitation

## 2020-04-12 NOTE — Telephone Encounter (Signed)
Tammy the case worker would like the clinical info from [facility] for the epidural shot requested. Tammy states this is the last time they will ask for this.  Tammy CB# [phone] Fax# [phone]

## 2020-05-16 MED FILL — ALPRAZolam 0.5 MG TABS: 0.5 | 2 days supply | Qty: 5 | Fill #0

## 2020-09-11 ENCOUNTER — Other Ambulatory Visit: Payer: Self-pay | Admitting: Physician Assistant

## 2020-09-11 ENCOUNTER — Other Ambulatory Visit: Payer: Self-pay | Admitting: Nurse Practitioner

## 2020-09-11 ENCOUNTER — Ambulatory Visit (HOSPITAL_COMMUNITY)
Admission: RE | Admit: 2020-09-11 | Discharge: 2020-09-11 | Disposition: A | Discharge status: Home / Self Care | Payer: No Typology Code available for payment source | Source: Ambulatory Visit | Attending: Pulmonary Disease | Admitting: Pulmonary Disease

## 2020-09-11 DIAGNOSIS — U071 COVID-19: Secondary | ICD-10-CM

## 2020-09-11 DIAGNOSIS — Z79899 Other long term (current) drug therapy: Secondary | ICD-10-CM | POA: Insufficient documentation

## 2020-09-11 DIAGNOSIS — I10 Essential (primary) hypertension: Secondary | ICD-10-CM | POA: Diagnosis not present

## 2020-09-11 LAB — BASIC METABOLIC PANEL
Anion gap: 6 (ref 5–15)
BUN: <5 mg/dL — ABNORMAL LOW (ref 6–20)
CO2: 28 mmol/L (ref 22–32)
Calcium: 8.5 mg/dL — ABNORMAL LOW (ref 8.9–10.3)
Chloride: 103 mmol/L (ref 98–111)
Creatinine, Ser: 0.85 mg/dL (ref 0.44–1.00)
GFR, Estimated: >60 mL/min (ref >60)
Glucose, Bld: 98 mg/dL (ref 70–99)
Potassium: 4 mmol/L (ref 3.5–5.1)
Sodium: 137 mmol/L (ref 135–145)

## 2020-09-11 MED ORDER — NIRMATRELVIR/RITONAVIR (PAXLOVID)TABLET: 3.0000 | ORAL_TABLET | Freq: Two times a day (BID) | ORAL | 0 refills | Status: AC

## 2020-09-11 NOTE — Progress Notes (Signed)
Called to discuss with patient about COVID-19 symptoms and the use of one of the available treatments for those with mild to moderate Covid symptoms and at a high risk of hospitalization.  Pt appears to qualify for outpatient treatment due to co-morbid conditions and/or a member of an at-risk group in accordance with the FDA Emergency Use Authorization.    Symptom onset:1/9 Vaccinated: Yes  Booster?  Qualifiers: [employer] Employee, BMI > 25 and HTN  Patient is good Paxlovid candidate and she is agree to start medications. Will write prescription pending BMET this afternoon.   Manson Passey, PAC

## 2020-09-11 NOTE — Progress Notes (Signed)
Outpatient Oral COVID Treatment Note  I connected with [name] on 09/11/2020/4:53 PM by telephone and verified that I am speaking with the correct person using two identifiers.  I discussed the limitations, risks, security, and privacy concerns of performing an evaluation and management service by telephone and the availability of in person appointments. I also discussed with the patient that there may be a patient responsible charge related to this service. The patient expressed understanding and agreed to proceed.  Patient location: Home Provider location: Home  Diagnosis: COVID-19 infection  Purpose of visit: Discussion of potential use of Molnupiravir or Paxlovid, a new treatment for mild to moderate COVID-19 viral infection in non-hospitalized patients.   Subjective: Patient is a 45 y.o. female who has been diagnosed with COVID 19 viral infection.  Their symptoms began on 1/9 with cough and congestion.   Vaccinated but not boosted.   Past Medical History:  Diagnosis Date  . Arthritis    right foot  . Asthma   . Congenital flat foot    has had bilateral foot surgery to correct  . Frequent headaches   . GERD (gastroesophageal reflux disease)   . Hypertension   . Irritable bowel syndrome (IBS)    no current med.  . Painful orthopaedic hardware ([facility]) 03/2013   right foot    Allergies  Allergen Reactions  . Augmentin [Amoxicillin-Pot Clavulanate] Nausea And Vomiting  . Biotin Other (See Comments)    Severe yeast infection  . Sulfa Antibiotics Other (See Comments)    Reaction:  Muscle pain      Current Outpatient Medications:  .  acetaminophen (TYLENOL) 500 MG tablet, Take 1,000 mg by mouth every 6 (six) hours as needed., Disp: , Rfl:  .  Albuterol Sulfate (PROAIR RESPICLICK) 108 (90 Base) MCG/ACT AEPB, Inhale 2 puffs into the lungs every 4 (four) hours as needed., Disp: 1 each, Rfl: 1 .  ALPRAZolam (NIRAVAM) 0.25 MG dissolvable tablet, Take 1 tablet (0.25 mg total)  by mouth 3 (three) times daily as needed for anxiety., Disp: 30 tablet, Rfl: 0 .  amLODipine (NORVASC) 10 MG tablet, Take 1 tablet (10 mg total) by mouth daily., Disp: 90 tablet, Rfl: 3 .  aspirin-acetaminophen-caffeine (EXCEDRIN MIGRAINE) 250-250-65 MG tablet, Take 1 tablet by mouth every 6 (six) hours as needed for headache., Disp: , Rfl:  .  cetirizine (ZYRTEC) 10 MG tablet, Take 10 mg by mouth daily., Disp: , Rfl:  .  diclofenac (VOLTAREN) 75 MG EC tablet, Take 1 tablet (75 mg total) by mouth 2 (two) times daily., Disp: 60 tablet, Rfl: 0 .  escitalopram (LEXAPRO) 10 MG tablet, Take 1 tablet (10 mg total) by mouth daily., Disp: 90 tablet, Rfl: 0 .  escitalopram (LEXAPRO) 5 MG tablet, Take 1 tablet (5 mg total) by mouth daily., Disp: 7 tablet, Rfl: 0 .  meloxicam (MOBIC) 7.5 MG tablet, Take 1 tablet (7.5 mg total) by mouth daily., Disp: 30 tablet, Rfl: 0 .  Probiotic Product (PROBIOTIC ADVANCED) CAPS, Take by mouth., Disp: , Rfl:   Objective: Patient appears/sounds sick. .  They are in no apparent distress.  Breathing is non labored.  Mood and behavior are normal.  Laboratory Data:  Recent Results (from the past 2160 hour(s))  Basic metabolic panel     Status: Abnormal   Collection Time: 09/11/20  3:33 PM  Result Value Ref Range   Sodium 137 135 - 145 mmol/L   Potassium 4.0 3.5 - 5.1 mmol/L   Chloride 103 98 -  111 mmol/L   CO2 28 22 - 32 mmol/L   Glucose, Bld 98 70 - 99 mg/dL    Comment: Glucose reference range applies only to samples taken after fasting for at least 8 hours.   BUN <5 (L) 6 - 20 mg/dL   Creatinine, Ser 9.32 0.44 - 1.00 mg/dL   Calcium 8.5 (L) 8.9 - 10.3 mg/dL   GFR, Estimated >67 >12 mL/min    Comment: (NOTE) Calculated using the CKD-EPI Creatinine Equation (2021)    Anion gap 6 5 - 15    Comment: Performed at [facility], [address], [place], [place] [address]     Assessment: 45 y.o. female with mild/moderate COVID 19 viral infection  diagnosed on 09/10/20 at high risk for progression to severe COVID 19.  Plan:  This patient is a 45 y.o. female that meets the following criteria for Emergency Use Authorization of: Paxlovid 1. Age >12 yr AND > 40 kg 2. SARS-COV-2 positive test 3. Symptom onset < 5 days 4. Mild-to-moderate COVID disease with high risk for severe progression to hospitalization or death  I have spoken and communicated the following to the patient or parent/caregiver regarding: 1. Paxlovid is an unapproved drug that is authorized for use under an Emergency Use Authorization.  2. There are no adequate, approved, available products for the treatment of COVID-19 in adults who have mild-to-moderate COVID-19 and are at high risk for progressing to severe COVID-19, including hospitalization or death. 3. Other therapeutics are currently authorized. For additional information on all products authorized for treatment or prevention of COVID-19, please see [email or web address]://[email or web address]-[email or web address]/.  4. There are benefits and risks of taking this treatment as outlined in the "Fact Sheet for Patients and Caregivers."  5. "Fact Sheet for Patients and Caregivers" was reviewed with patient. A hard copy will be provided to patient from pharmacy prior to the patient receiving treatment. 6. Patients should continue to self-isolate and use infection control measures (e.g., wear mask, isolate, social distance, avoid sharing personal items, clean and disinfect "high touch" surfaces, and frequent handwashing) according to CDC guidelines.  7. The patient or parent/caregiver has the option to accept or refuse treatment. 8. Patient medication history was reviewed for potential drug interactions:No drug interactions 9. Patient's creatinine clearance was calculated to be > 60. , and they were therefore prescribed Normal dose (CrCl>60) - nirmatrelvir 150mg  tab (2  tablet) by mouth twice daily AND ritonavir 100mg  tab (1 tablet) by mouth twice daily   After reviewing above information with the patient, the patient agrees to receive Paxlovid.  Follow up instructions:    . Take prescription BID x 5 days as directed . Reach out to pharmacist for counseling on medication if desired . For concerns regarding further COVID symptoms please follow up with your PCP or urgent care . For urgent or life-threatening issues, seek care at your local emergency department  The patient was provided an opportunity to ask questions, and all were answered. The patient agreed with the plan and demonstrated an understanding of the instructions.   Script sent to [facility] and opted to pick up RX.  The patient was advised to call their PCP or seek an in-person evaluation if the symptoms worsen or if the condition fails to improve as anticipated.   I provided 45 minutes of face-to-face video visit time during this encounter, and > 50% was spent counseling as documented under my assessment & plan.  [place], [facility] 09/11/2020 /4:53 PM

## 2020-09-18 MED ORDER — NIRMATRELVIR/RITONAVIR (PAXLOVID)TABLET: 3.0000 | ORAL_TABLET | Freq: Two times a day (BID) | ORAL | 0 refills | Status: DC

## 2020-09-18 NOTE — Progress Notes (Signed)
Duplicate Orders for Paxlovid placed- order removed.

## 2020-09-19 ENCOUNTER — Encounter: Payer: Self-pay | Admitting: Registered Nurse

## 2020-09-20 ENCOUNTER — Other Ambulatory Visit: Payer: Self-pay | Admitting: Family Medicine

## 2020-09-20 ENCOUNTER — Encounter: Payer: Self-pay | Admitting: Family Medicine

## 2020-09-20 ENCOUNTER — Other Ambulatory Visit: Payer: Self-pay

## 2020-09-20 ENCOUNTER — Telehealth (INDEPENDENT_AMBULATORY_CARE_PROVIDER_SITE_OTHER): Payer: No Typology Code available for payment source | Admitting: Family Medicine

## 2020-09-20 DIAGNOSIS — U071 COVID-19: Secondary | ICD-10-CM | POA: Diagnosis not present

## 2020-09-20 DIAGNOSIS — I10 Essential (primary) hypertension: Secondary | ICD-10-CM | POA: Diagnosis not present

## 2020-09-20 MED ORDER — AMLODIPINE BESYLATE 10 MG PO TABS: 10.0000 mg | ORAL_TABLET | Freq: Every day | ORAL | 0 refills | Status: DC

## 2020-09-20 MED ORDER — PREDNISONE 20 MG PO TABS: ORAL_TABLET | ORAL | 0 refills | Status: DC

## 2020-09-20 MED ORDER — BENZONATATE 100 MG PO CAPS: 100.0000 mg | ORAL_CAPSULE | Freq: Three times a day (TID) | ORAL | 2 refills | Status: DC | PRN

## 2020-09-20 MED FILL — predniSONE 20 MG TABS: 20 | 5 days supply | Qty: 9 | Fill #0

## 2020-09-20 MED FILL — BENZONATATE 100 MG CAPS: 100 | 15 days supply | Qty: 90 | Fill #0

## 2020-09-20 MED FILL — AMLODIPINE BESYLATE 10 MG T: 10 | 90 days supply | Qty: 90 | Fill #0

## 2020-09-20 NOTE — Patient Instructions (Addendum)
 Cough, Adult A cough helps to clear your throat and lungs. A cough may be a sign of an illness or another medical condition. An acute cough may only last 2-3 weeks, while a chronic cough may last 8 or more weeks. Many things can cause a cough. They include:  Germs (viruses or bacteria) that attack the airway.  Breathing in things that bother (irritate) your lungs.  Allergies.  Asthma.  Mucus that runs down the back of your throat (postnasal drip).  Smoking.  Acid backing up from the stomach into the tube that moves food from the mouth to the stomach (gastroesophageal reflux).  Some medicines.  Lung problems.  Other medical conditions, such as heart failure or a blood clot in the lung (pulmonary embolism). Follow these instructions at home: Medicines  Take over-the-counter and prescription medicines only as told by your doctor.  Talk with your doctor before you take medicines that stop a cough (cough suppressants). Lifestyle  Do not smoke, and try not to be around smoke. Do not use any products that contain nicotine or tobacco, such as cigarettes, e-cigarettes, and chewing tobacco. If you need help quitting, ask your doctor.  Drink enough fluid to keep your pee (urine) pale yellow.  Avoid caffeine.  Do not drink alcohol if your doctor tells you not to drink.   General instructions  Watch for any changes in your cough. Tell your doctor about them.  Always cover your mouth when you cough.  Stay away from things that make you cough, such as perfume, candles, campfire smoke, or cleaning products.  If the air is dry, use a cool mist vaporizer or humidifier in your home.  If your cough is worse at night, try using extra pillows to raise your head up higher while you sleep.  Rest as needed.  Keep all follow-up visits as told by your doctor. This is important.   Contact a doctor if:  You have new symptoms.  You cough up pus.  Your cough does not get better after  2-3 weeks, or your cough gets worse.  Cough medicine does not help your cough and you are not sleeping well.  You have pain that gets worse or pain that is not helped with medicine.  You have a fever.  You are losing weight and you do not know why.  You have night sweats. Get help right away if:  You cough up blood.  You have trouble breathing.  Your heartbeat is very fast. These symptoms may be an emergency. Do not wait to see if the symptoms will go away. Get medical help right away. Call your local emergency services (911 in the [place] Do not drive yourself to the hospital. Summary  A cough helps to clear your throat and lungs. Many things can cause a cough.  Take over-the-counter and prescription medicines only as told by your doctor.  Always cover your mouth when you cough.  Contact a doctor if you have new symptoms or you have a cough that does not get better or gets worse. This information is not intended to replace advice given to you by your health care provider. Make sure you discuss any questions you have with your health care provider. Document Revised: 10/07/2019 Document Reviewed: 09/06/2018 Elsevier Patient Education  2021 Elsevier Inc.    If you have lab work done today you will be contacted with your lab results within the next 2 weeks.  If you have not heard from us then please   contact us. The fastest way to get your results is to register for My Chart.   IF you received an x-ray today, you will receive an invoice from [facility]. Please contact [facility] at [phone] with questions or concerns regarding your invoice.   IF you received labwork today, you will receive an invoice from [facility]. Please contact [facility] at [phone] with questions or concerns regarding your invoice.   Our billing staff will not be able to assist you with questions regarding bills from these companies.  You will be contacted with the lab results as  soon as they are available. The fastest way to get your results is to activate your My Chart account. Instructions are located on the last page of this paperwork. If you have not heard from us regarding the results in 2 weeks, please contact this office.      

## 2020-09-20 NOTE — Progress Notes (Signed)
Virtual Visit Note  I connected with patient on 09/20/20 at 1211 by telephone due to unable to work Epic video visit and verified that I am speaking with the correct person using two identifiers. [name] is currently located at home and no family members are currently with them during visit. The provider, Azalee Course Patsy Varma, FNP is located in their office at time of visit.  I discussed the limitations, risks, security and privacy concerns of performing an evaluation and management service by telephone and the availability of in person appointments. I also discussed with the patient that there may be a patient responsible charge related to this service. The patient expressed understanding and agreed to proceed.   I provided 20 minutes of non-face-to-face time during this encounter.  Chief Complaint  Patient presents with  . cogh    And congestion - delsyum and nyquil , tylenol ibuprofen otc not working- positive covid 1/10  . Medication Refill    amlodipine     HPI ? Lingering cough 1/9 started feeling sick 1/10 positive for COVID Symptoms started with fever, aches, and sore throat Takes delsym, and nyquil at night Takes tea for the cough Had 2 shots of Pfizer  Allergies  Allergen Reactions  . Augmentin [Amoxicillin-Pot Clavulanate] Nausea And Vomiting  . Biotin Other (See Comments)    Severe yeast infection  . Sulfa Antibiotics Other (See Comments)    Reaction:  Muscle pain     Prior to Admission medications   Medication Sig Start Date End Date Taking? Authorizing Provider  acetaminophen (TYLENOL) 500 MG tablet Take 1,000 mg by mouth every 6 (six) hours as needed.   Yes [provider]  amLODipine (NORVASC) 10 MG tablet Take 1 tablet (10 mg total) by mouth daily. 07/21/19 10/19/19 Yes O'Neal, Ronnald Ramp, MD  Albuterol Sulfate (PROAIR RESPICLICK) 108 (90 Base) MCG/ACT AEPB Inhale 2 puffs into the lungs every 4 (four) hours as needed. Patient not taking:  Reported on 09/20/2020 06/18/18   Sheliah Hatch, MD  ALPRAZolam (NIRAVAM) 0.25 MG dissolvable tablet Take 1 tablet (0.25 mg total) by mouth 3 (three) times daily as needed for anxiety. Patient not taking: Reported on 09/20/2020 01/02/20   Janeece Agee, NP  aspirin-acetaminophen-caffeine [facility] MIGRAINE) [id number] MG tablet Take 1 tablet by mouth every 6 (six) hours as needed for headache. Patient not taking: Reported on 09/20/2020    [provider]  cetirizine (ZYRTEC) 10 MG tablet Take 10 mg by mouth daily. Patient not taking: Reported on 09/20/2020    [provider]  diclofenac (VOLTAREN) 75 MG EC tablet Take 1 tablet (75 mg total) by mouth 2 (two) times daily. Patient not taking: Reported on 09/20/2020 10/25/19   Tyrell Antonio, MD  escitalopram (LEXAPRO) 10 MG tablet Take 1 tablet (10 mg total) by mouth daily. Patient not taking: Reported on 09/20/2020 01/02/20   Janeece Agee, NP  escitalopram (LEXAPRO) 5 MG tablet Take 1 tablet (5 mg total) by mouth daily. Patient not taking: Reported on 09/20/2020 01/02/20   Janeece Agee, NP  meloxicam (MOBIC) 7.5 MG tablet Take 1 tablet (7.5 mg total) by mouth daily. Patient not taking: Reported on 09/20/2020 08/16/19   Doristine Bosworth, MD  Probiotic Product (PROBIOTIC ADVANCED) CAPS Take by mouth. Patient not taking: Reported on 09/20/2020    [provider]    Past Medical History:  Diagnosis Date  . Arthritis    right foot  . Asthma   . 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 Frequent headaches   . GERD (gastroesophageal reflux disease)   . Hypertension   . Irritable bowel syndrome (IBS)    no current med.  . Painful orthopaedic hardware [facility]) 03/2013   right foot    Past Surgical History:  Procedure Laterality Date  . CALCANEAL OSTEOTOMY Right 12/23/2012   Procedure: RIGHT CALCANEAL OSTEOTOMY, RIGHT LATERAL COLUMN LENGTHENING, RIGHT COTTON OSTEOTOMY, RIGHT FLEX DIGITORIUM  LONGUS TRANSFER, RIGHT KIDNER PROCEDURE ;  Surgeon: Toni Arthurs, MD;  Location: [facility];  Service: Orthopedics;  Laterality: Right;  . CESAREAN SECTION  07/13/2005; 06/03/2006  . DILATION AND EVACUATION  08/16/2008  . FOOT SURGERY Left 2002  . FOOT SURGERY Right 10/2018  . GASTROCNEMIUS RECESSION Right 12/23/2012   Procedure: RIGHT GASTRO RECESSION ;  Surgeon: Toni Arthurs, MD;  Location: [facility];  Service: Orthopedics;  Laterality: Right;  . HARDWARE REMOVAL Right 03/24/2013   Procedure: HARDWARE REMOVAL RIGHT HEEL;  Surgeon: Toni Arthurs, MD;  Location: [facility];  Service: Orthopedics;  Laterality: Right;  . INTRAUTERINE DEVICE INSERTION    . WISDOM TOOTH EXTRACTION      Social History   Tobacco Use  . Smoking status: Never Smoker  . Smokeless tobacco: Never Used  Substance Use Topics  . Alcohol use: No    Family History  Problem Relation Age of Onset  . Hypertension Mother   . Hypertension Father   . Diabetes Father   . Irritable bowel syndrome Father   . Arrhythmia Father   . Breast cancer Paternal Grandmother   . Colon cancer Cousin        maternal  . Rectal cancer Neg Hx   . Esophageal cancer Neg Hx   . Stomach cancer Neg Hx     Review of Systems  Constitutional: Negative for chills, fever and malaise/fatigue.  HENT: Positive for sore throat. Negative for congestion and sinus pain.   Respiratory: Positive for cough. Negative for sputum production, shortness of breath and wheezing.   Cardiovascular: Negative for chest pain and palpitations.  Gastrointestinal: Negative for abdominal pain, constipation, diarrhea, heartburn, nausea and vomiting.  Musculoskeletal: Negative for back pain.  Neurological: Negative for headaches.    Objective  Constitutional:      General: Not in acute distress.    Appearance: Normal appearance. Not ill-appearing.   Pulmonary:     Effort: Pulmonary effort is normal. No respiratory  distress.  Neurological:     Mental Status: Alert and oriented to person, place, and time.  Psychiatric:        Mood and Affect: Mood normal.        Behavior: Behavior normal.     ASSESSMENT and PLAN  Problem List Items Addressed This Visit   None   Visit Diagnoses    COVID-19    -  Primary   Relevant Medications   benzonatate (TESSALON) 100 MG capsule   predniSONE (DELTASONE) 20 MG tablet   Essential hypertension       Relevant Medications   amLODipine (NORVASC) 10 MG tablet      Plan   Refills for amlodipine sent  Continue conservative treatments for symptoms  Prednisone taper and tessalon prn prescribed for continued cough  RTC/ED precautions provided  Work note sent  Return if symptoms worsen or fail to improve.    The above assessment and management plan was discussed with the patient. The patient verbalized understanding of and has agreed to  the management plan. Patient is aware to call the clinic if symptoms persist or worsen. Patient is aware when to return to the clinic for a follow-up visit. Patient educated on when it is appropriate to go to the emergency department.     Huston Foley Karry Causer, FNP-BC [facility] [place], [place] [address] Ph.  [phone] Fax [phone]

## 2020-09-27 ENCOUNTER — Encounter: Payer: Self-pay | Admitting: Family Medicine

## 2020-10-01 ENCOUNTER — Other Ambulatory Visit: Payer: Self-pay | Admitting: Family Medicine

## 2020-10-01 DIAGNOSIS — U071 COVID-19: Secondary | ICD-10-CM

## 2020-10-01 MED ORDER — HYDROCOD POLST-CPM POLST ER 10-8 MG/5ML PO SUER
5.0000 mL | Freq: Every evening | ORAL | 0 refills | Status: DC | PRN
Start: 2020-10-01 — End: 2020-10-01

## 2020-10-01 MED FILL — HYDROCODONE-CHLORPHEN ER SU: 10-8 | 5 days supply | Qty: 25 | Fill #0

## 2021-01-23 ENCOUNTER — Ambulatory Visit (INDEPENDENT_AMBULATORY_CARE_PROVIDER_SITE_OTHER): Payer: No Typology Code available for payment source

## 2021-01-23 ENCOUNTER — Other Ambulatory Visit (HOSPITAL_COMMUNITY): Payer: Self-pay

## 2021-01-23 ENCOUNTER — Ambulatory Visit (INDEPENDENT_AMBULATORY_CARE_PROVIDER_SITE_OTHER): Payer: No Typology Code available for payment source | Admitting: Podiatry

## 2021-01-23 ENCOUNTER — Other Ambulatory Visit: Payer: Self-pay

## 2021-01-23 ENCOUNTER — Encounter: Payer: Self-pay | Admitting: Podiatry

## 2021-01-23 DIAGNOSIS — M778 Other enthesopathies, not elsewhere classified: Secondary | ICD-10-CM

## 2021-01-23 DIAGNOSIS — M7672 Peroneal tendinitis, left leg: Secondary | ICD-10-CM | POA: Diagnosis not present

## 2021-01-23 MED ORDER — DICLOFENAC SODIUM 75 MG PO TBEC
75.0000 mg | DELAYED_RELEASE_TABLET | Freq: Two times a day (BID) | ORAL | 2 refills | Status: DC
Start: 2021-01-23 — End: 2021-05-15
  Filled 2021-01-23: qty 50, 25d supply, fill #0

## 2021-01-23 MED ORDER — TRIAMCINOLONE ACETONIDE 10 MG/ML IJ SUSP
10.0000 mg | Freq: Once | INTRAMUSCULAR | Status: AC
  Administered 2021-01-23: 10 mg

## 2021-01-23 NOTE — Progress Notes (Signed)
Subjective:   Patient ID: [name], female   DOB: 45 y.o.   MRN: [id number]   HPI Patient states she developed a lot of pain in the outside of her left heel and ankle recently and does not remember injury.  States its been hurting for around a month and gradually becoming more aggravating for her.  States she is trying to ice it and wear different shoes neuro   ROS      Objective:  Physical Exam  Vascular status intact moderate collapse arch medial longitudinal bilateral with inflammation around the peroneal as it comes underneath the lateral malleolus with mild swelling.  No indication of tendon dysfunction     Assessment:  Peroneal tendinitis left with inflammation with slight possibility of interstitial tear     Plan:  H&P reviewed condition and recommended support and did dispense a fascial brace to lift up the lateral side of the foot did sterile prep and injected the sheath of the peroneal tendon 3 mg dexamethasone Kenalog 5 mg Xylocaine advised on ice therapy and supportive shoes and reappoint to recheck  X-rays indicate that there is moderate flatfoot deformity good healing of the previous osteotomy done a number of years ago with moderate flatfoot deformity and no indications of ankle pathology currently

## 2021-02-26 IMAGING — MG MM DIGITAL DIAGNOSTIC UNILAT*L* W/ TOMO W/ CAD
4 series · 4 of 12 positions shown · non-contrast
Comparison: Previous exam(s).

CLINICAL DATA: The patient was called back for left breast
asymmetry.

EXAM:
DIGITAL DIAGNOSTIC UNILATERAL LEFT MAMMOGRAM WITH CAD AND TOMO

[L CC synth-2D]
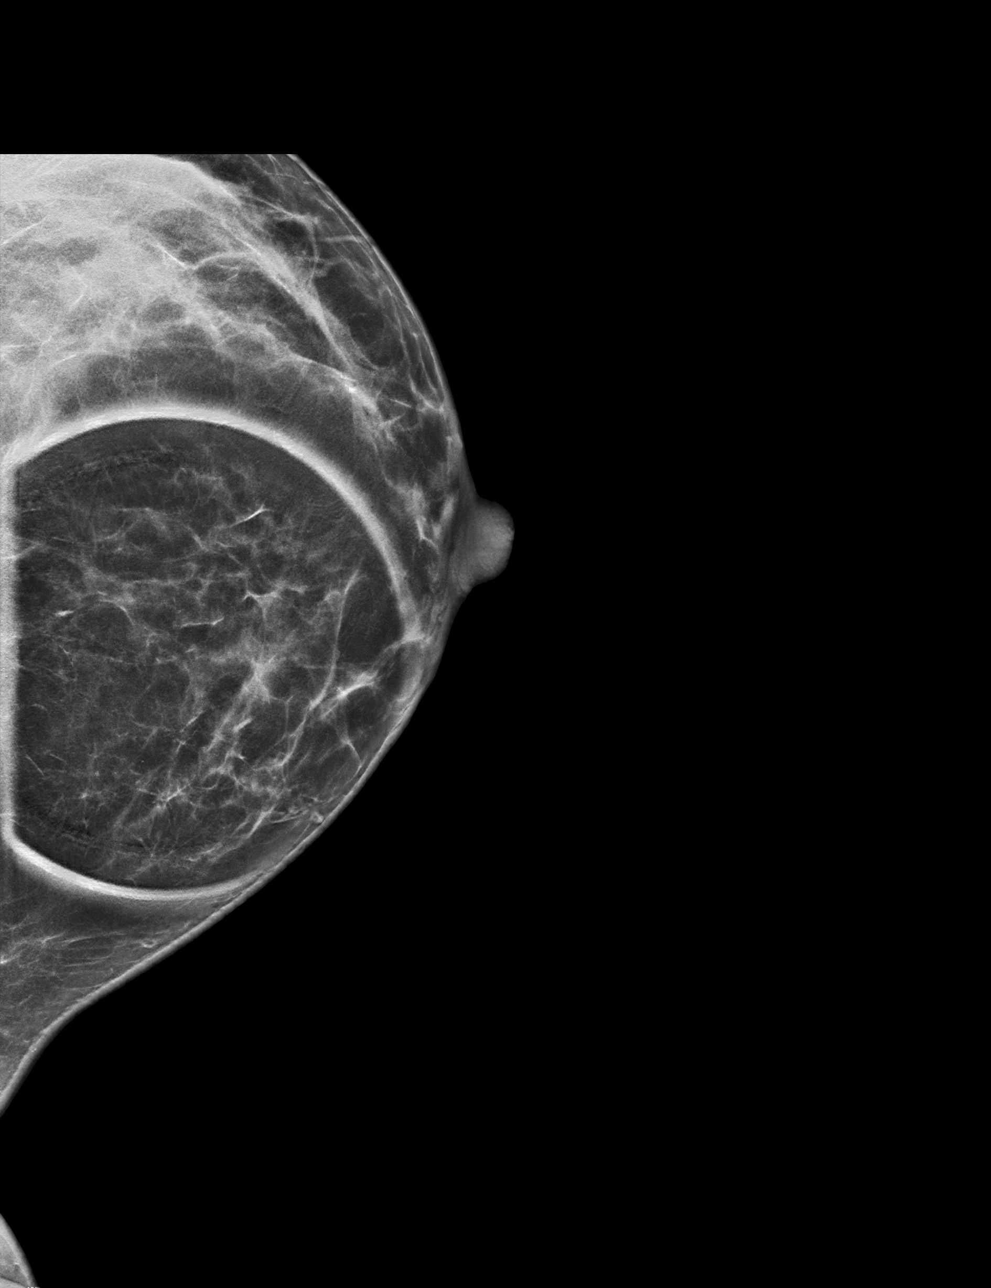

[L ML synth-2D]
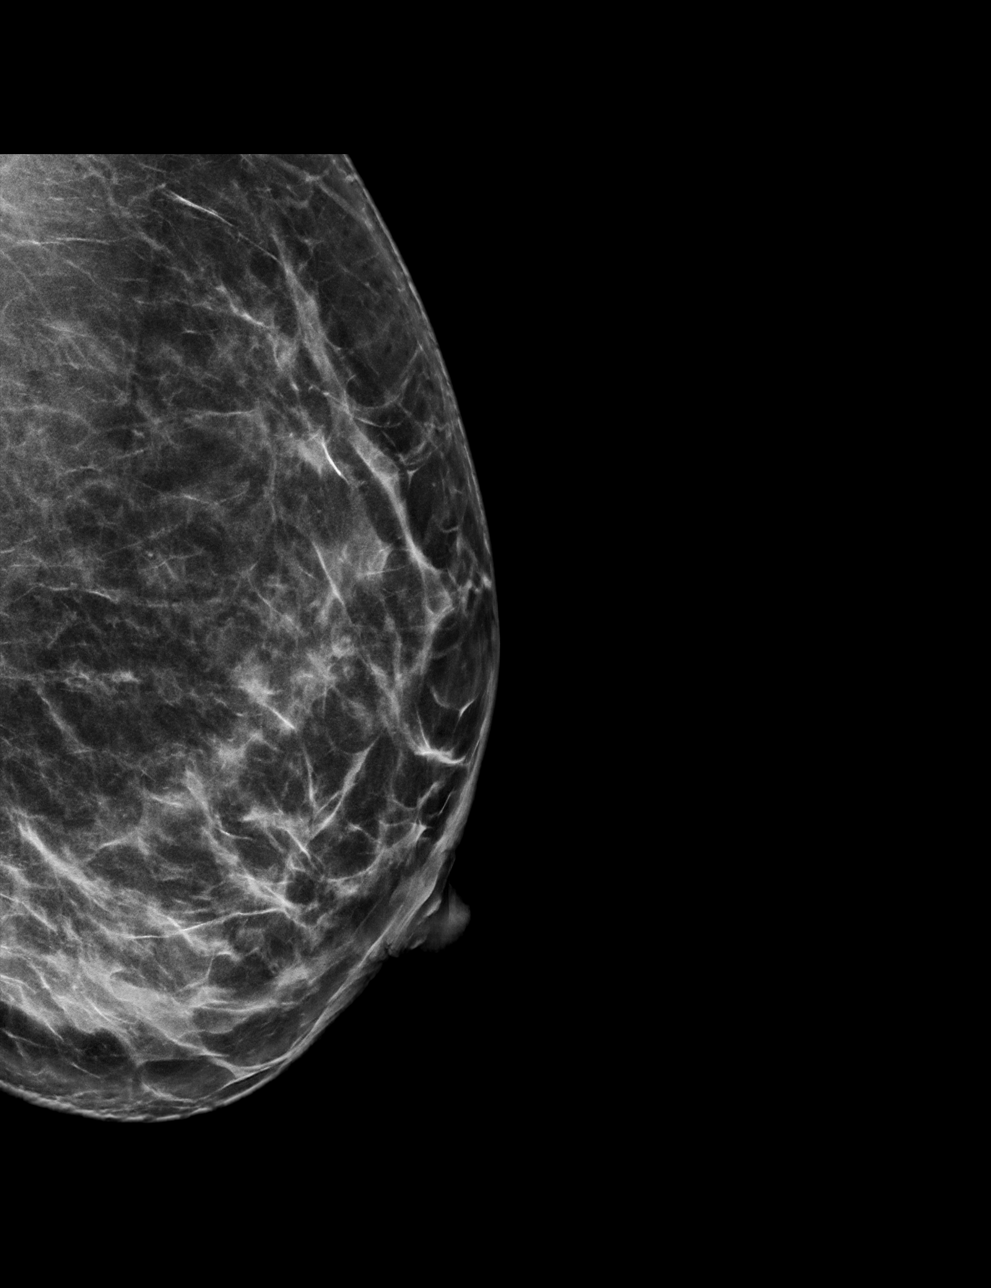

[L ML tomo · tomo slice 37/74.0]
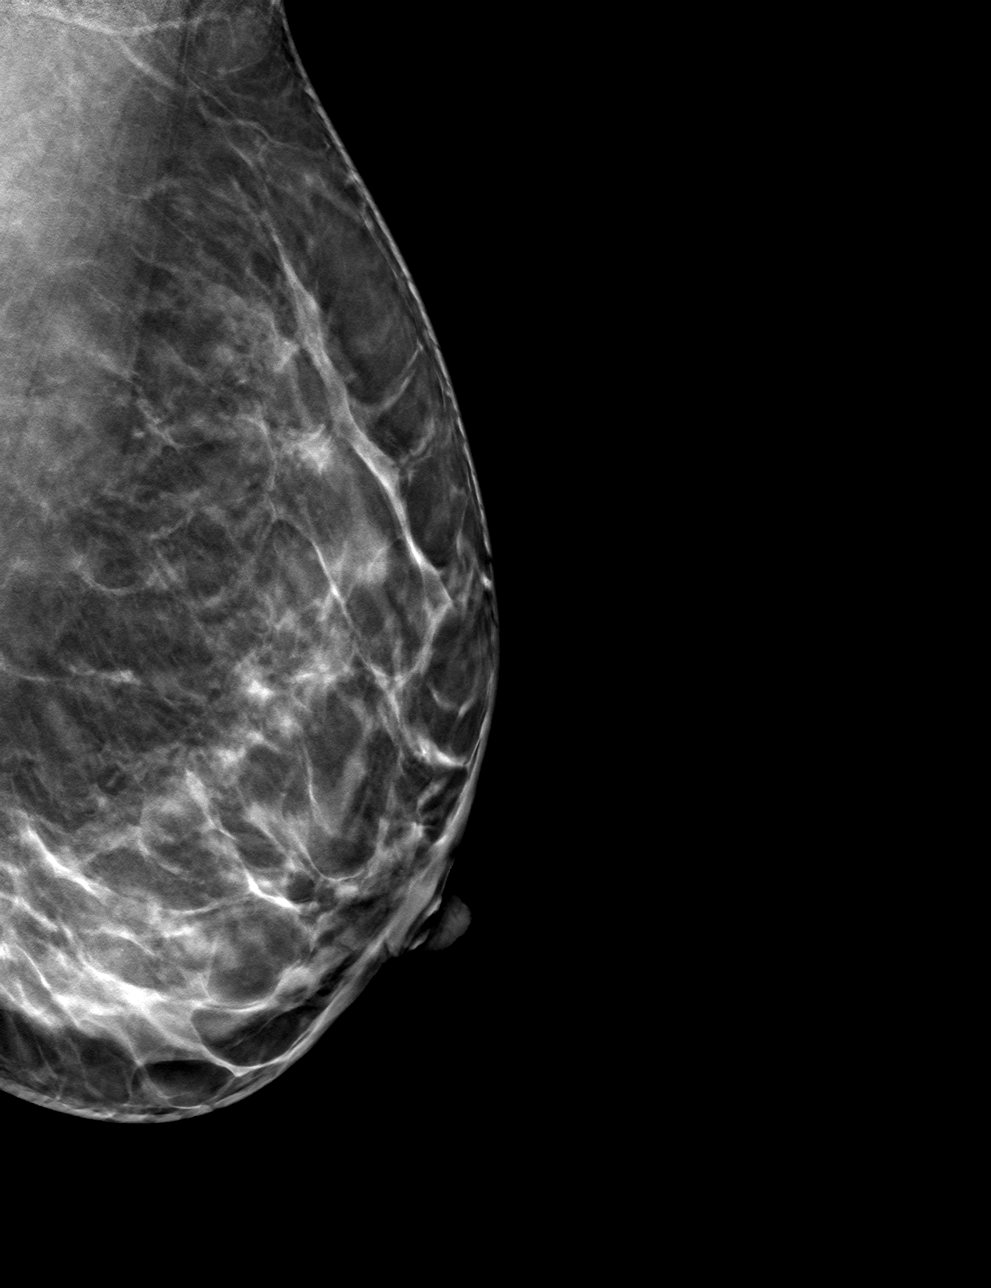

[L CC tomo · tomo slice 27/54.0]
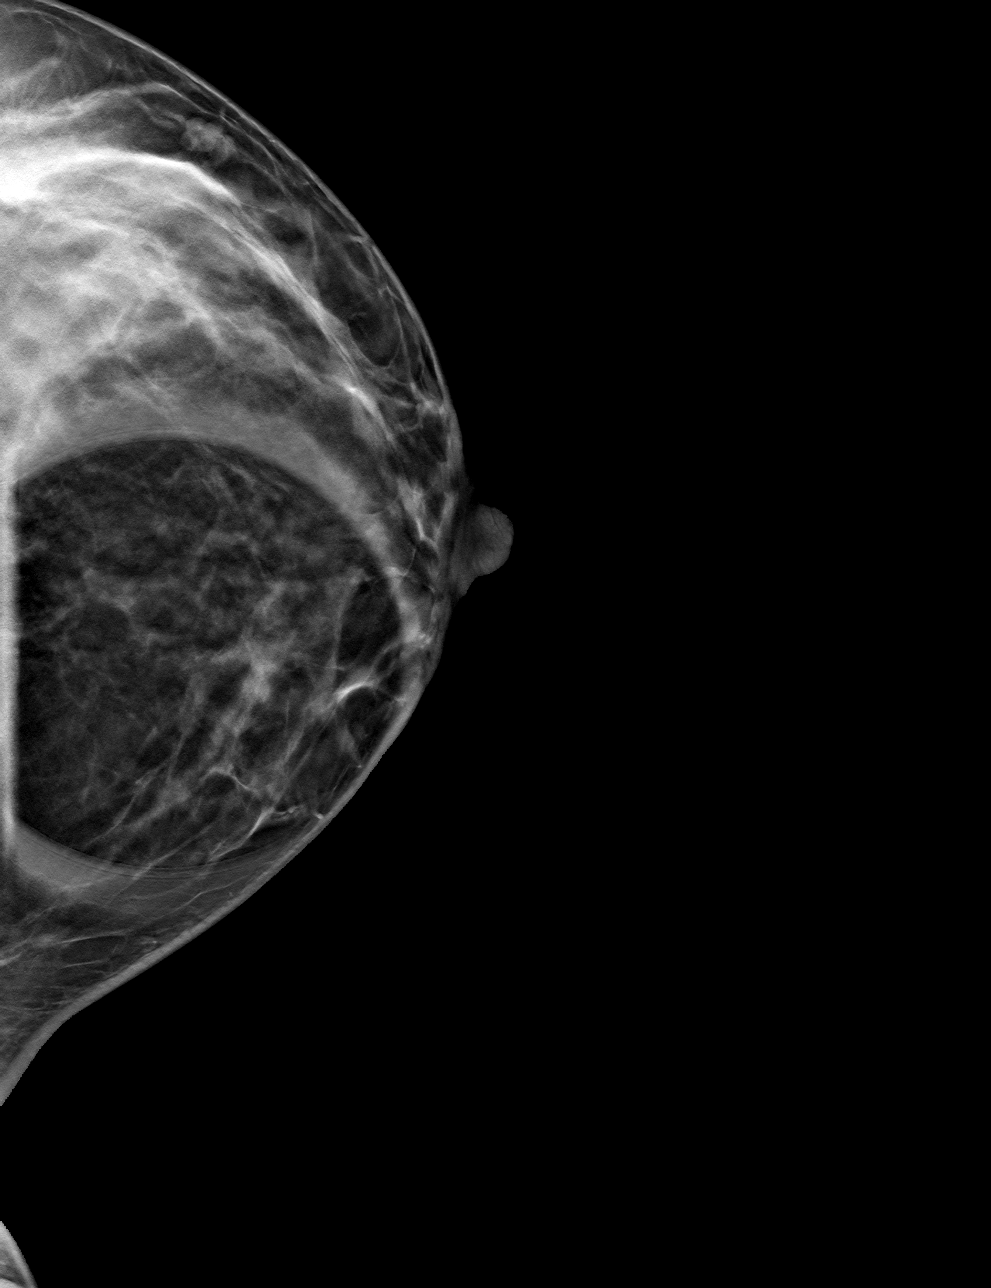

[4 of 12 positions shown; findings below may reference images not displayed]

ACR Breast Density Category c: The breast tissue is heterogeneously
dense, which may obscure small masses.
FINDINGS: The left breast asymmetry resolves on additional imaging.

Mammographic images were processed with CAD.
IMPRESSION: No mammographic evidence of malignancy.

RECOMMENDATION:
Annual screening mammography.

I have discussed the findings and recommendations with the patient.
If applicable, a reminder letter will be sent to the patient
regarding the next appointment.

BI-RADS CATEGORY  2: Benign.

## 2021-04-29 ENCOUNTER — Other Ambulatory Visit (HOSPITAL_COMMUNITY): Payer: Self-pay

## 2021-04-29 ENCOUNTER — Other Ambulatory Visit: Payer: Self-pay | Admitting: Nurse Practitioner

## 2021-04-29 ENCOUNTER — Other Ambulatory Visit: Payer: Self-pay

## 2021-04-29 ENCOUNTER — Ambulatory Visit (INDEPENDENT_AMBULATORY_CARE_PROVIDER_SITE_OTHER): Payer: No Typology Code available for payment source | Admitting: Nurse Practitioner

## 2021-04-29 ENCOUNTER — Encounter: Payer: Self-pay | Admitting: Nurse Practitioner

## 2021-04-29 VITALS — BP 142/98 | HR 68 | Temp 98.2°F | Ht 64.0 in | Wt 185.4 lb

## 2021-04-29 DIAGNOSIS — R053 Chronic cough: Secondary | ICD-10-CM

## 2021-04-29 DIAGNOSIS — K52832 Lymphocytic colitis: Secondary | ICD-10-CM | POA: Diagnosis not present

## 2021-04-29 DIAGNOSIS — Z0000 Encounter for general adult medical examination without abnormal findings: Secondary | ICD-10-CM

## 2021-04-29 DIAGNOSIS — I10 Essential (primary) hypertension: Secondary | ICD-10-CM | POA: Diagnosis not present

## 2021-04-29 LAB — POCT URINALYSIS DIPSTICK
Bilirubin, UA: NEGATIVE
Glucose, UA: NEGATIVE
Ketones, UA: NEGATIVE
Leukocytes, UA: NEGATIVE
Nitrite, UA: NEGATIVE
Protein, UA: NEGATIVE
Spec Grav, UA: 1.025 (ref 1.010–1.025)
Urobilinogen, UA: 0.2 E.U./dL
pH, UA: 6.5 (ref 5.0–8.0)

## 2021-04-29 MED ORDER — CHLORTHALIDONE 25 MG PO TABS
25.0000 mg | ORAL_TABLET | Freq: Every day | ORAL | 1 refills | Status: DC
Start: 2021-04-29 — End: 2022-03-31
  Filled 2021-04-29: qty 30, 30d supply, fill #0

## 2021-04-29 MED ORDER — PROAIR RESPICLICK 108 (90 BASE) MCG/ACT IN AEPB
2.0000 | INHALATION_SPRAY | RESPIRATORY_TRACT | 1 refills | Status: DC | PRN
  Filled 2021-04-29: qty 1, 17d supply, fill #0

## 2021-04-29 MED ORDER — LEVOCETIRIZINE DIHYDROCHLORIDE 5 MG PO TABS
5.0000 mg | ORAL_TABLET | Freq: Every evening | ORAL | 2 refills | Status: DC
  Filled 2021-04-29: qty 30, 30d supply, fill #0
  Filled 2021-06-10: qty 30, 30d supply, fill #1

## 2021-04-29 MED ORDER — OMEPRAZOLE 20 MG PO CPDR
20.0000 mg | DELAYED_RELEASE_CAPSULE | Freq: Every day | ORAL | 3 refills | Status: DC
  Filled 2021-04-29: qty 30, 30d supply, fill #0
  Filled 2021-06-10: qty 30, 30d supply, fill #1

## 2021-04-29 NOTE — Patient Instructions (Addendum)
Hypertension, Adult Hypertension is another name for high blood pressure. High blood pressure forces your heart to work harder to pump blood. This can cause problems overtime. There are two numbers in a blood pressure reading. There is a top number (systolic) over a bottom number (diastolic). It is best to have a blood pressure that is below 120/80. Healthy choicescan help lower your blood pressure, or you may need medicine to help lower it. What are the causes? The cause of this condition is not known. Some conditions may be related tohigh blood pressure. What increases the risk? Smoking. Having type 2 diabetes mellitus, high cholesterol, or both. Not getting enough exercise or physical activity. Being overweight. Having too much fat, sugar, calories, or salt (sodium) in your diet. Drinking too much alcohol. Having long-term (chronic) kidney disease. Having a family history of high blood pressure. Age. Risk increases with age. Race. You may be at higher risk if you are African American. Gender. Men are at higher risk than women before age 20. After age 72, women are at higher risk than men. Having obstructive sleep apnea. Stress. What are the signs or symptoms? High blood pressure may not cause symptoms. Very high blood pressure (hypertensive crisis) may cause: Headache. Feelings of worry or nervousness (anxiety). Shortness of breath. Nosebleed. A feeling of being sick to your stomach (nausea). Throwing up (vomiting). Changes in how you see. Very bad chest pain. Seizures. How is this treated? This condition is treated by making healthy lifestyle changes, such as: Eating healthy foods. Exercising more. Drinking less alcohol. Your health care provider may prescribe medicine if lifestyle changes are not enough to get your blood pressure under control, and if: Your top number is above 130. Your bottom number is above 80. Your personal target blood pressure may vary. Follow these  instructions at home: Eating and drinking  If told, follow the DASH eating plan. To follow this plan: Fill one half of your plate at each meal with fruits and vegetables. Fill one fourth of your plate at each meal with whole grains. Whole grains include whole-wheat pasta, brown rice, and whole-grain bread. Eat or drink low-fat dairy products, such as skim milk or low-fat yogurt. Fill one fourth of your plate at each meal with low-fat (lean) proteins. Low-fat proteins include fish, chicken without skin, eggs, beans, and tofu. Avoid fatty meat, cured and processed meat, or chicken with skin. Avoid pre-made or processed food. Eat less than 1,500 mg of salt each day. Do not drink alcohol if: Your doctor tells you not to drink. You are pregnant, may be pregnant, or are planning to become pregnant. If you drink alcohol: Limit how much you use to: 0-1 drink a day for women. 0-2 drinks a day for men. Be aware of how much alcohol is in your drink. In the [place] one drink equals one 12 oz bottle of beer (355 mL), one 5 oz glass of wine (148 mL), or one 1 oz glass of hard liquor (44 mL).  Lifestyle  Work with your doctor to stay at a healthy weight or to lose weight. Ask your doctor what the best weight is for you. Get at least 30 minutes of exercise most days of the week. This may include walking, swimming, or biking. Get at least 30 minutes of exercise that strengthens your muscles (resistance exercise) at least 3 days a week. This may include lifting weights or doing Pilates. Do not use any products that contain nicotine or tobacco, such as cigarettes,  e-cigarettes, and chewing tobacco. If you need help quitting, ask your doctor. Check your blood pressure at home as told by your doctor. Keep all follow-up visits as told by your doctor. This is important.  Medicines Take over-the-counter and prescription medicines only as told by your doctor. Follow directions carefully. Do not skip doses of  blood pressure medicine. The medicine does not work as well if you skip doses. Skipping doses also puts you at risk for problems. Ask your doctor about side effects or reactions to medicines that you should watch for. Contact a doctor if you: Think you are having a reaction to the medicine you are taking. Have headaches that keep coming back (recurring). Feel dizzy. Have swelling in your ankles. Have trouble with your vision. Get help right away if you: Get a very bad headache. Start to feel mixed up (confused). Feel weak or numb. Feel faint. Have very bad pain in your: Chest. Belly (abdomen). Throw up more than once. Have trouble breathing. Summary Hypertension is another name for high blood pressure. High blood pressure forces your heart to work harder to pump blood. For most people, a normal blood pressure is less than 120/80. Making healthy choices can help lower blood pressure. If your blood pressure does not get lower with healthy choices, you may need to take medicine. This information is not intended to replace advice given to you by your health care provider. Make sure you discuss any questions you have with your healthcare provider. Document Revised: 04/28/2018 Document Reviewed: 04/28/2018 Elsevier Patient Education  2022 Elsevier Inc.   Low-Sodium Eating Plan Sodium, which is an element that makes up salt, helps you maintain a healthy balance of fluids in your body. Too much sodium can increase your bloodpressure and cause fluid and waste to be held in your body. Your health care provider or dietitian may recommend following this plan if you have high blood pressure (hypertension), kidney disease, liver disease, or heart failure. Eating less sodium can help lower your blood pressure, reduce swelling, and protect your heart, liver, andkidneys. What are tips for following this plan? Reading food labels The Nutrition Facts label lists the amount of sodium in one serving of  the food. If you eat more than one serving, you must multiply the listed amount of sodium by the number of servings. Choose foods with less than 140 mg of sodium per serving. Avoid foods with 300 mg of sodium or more per serving. Shopping  Look for lower-sodium products, often labeled as "low-sodium" or "no salt added." Always check the sodium content, even if foods are labeled as "unsalted" or "no salt added." Buy fresh foods. Avoid canned foods and pre-made or frozen meals. Avoid canned, cured, or processed meats. Buy breads that have less than 80 mg of sodium per slice.  Cooking  Eat more home-cooked food and less restaurant, buffet, and fast food. Avoid adding salt when cooking. Use salt-free seasonings or herbs instead of table salt or sea salt. Check with your health care provider or pharmacist before using salt substitutes. Cook with plant-based oils, such as canola, sunflower, or olive oil.  Meal planning When eating at a restaurant, ask that your food be prepared with less salt or no salt, if possible. Avoid dishes labeled as brined, pickled, cured, smoked, or made with soy sauce, miso, or teriyaki sauce. Avoid foods that contain MSG (monosodium glutamate). MSG is sometimes added to [place] food, bouillon, and some canned foods. Make meals that can be grilled, baked,  poached, roasted, or steamed. These are generally made with less sodium. General information Most people on this plan should limit their sodium intake to 1,500-2,000 mg (milligrams) of sodium each day. What foods should I eat? Fruits Fresh, frozen, or canned fruit. Fruit juice. Vegetables Fresh or frozen vegetables. "No salt added" canned vegetables. "No salt added"tomato sauce and paste. Low-sodium or reduced-sodium tomato and vegetable juice. Grains Low-sodium cereals, including oats, puffed wheat and rice, and shredded wheat. Low-sodium crackers. Unsalted rice. Unsalted pasta. Low-sodium bread.Whole-grain breads  and whole-grain pasta. Meats and other proteins Fresh or frozen (no salt added) meat, poultry, seafood, and fish. Low-sodium canned tuna and salmon. Unsalted nuts. Dried peas, beans, and lentils withoutadded salt. Unsalted canned beans. Eggs. Unsalted nut butters. Dairy Milk. Soy milk. Cheese that is naturally low in sodium, such as ricotta cheese, fresh mozzarella, or Swiss cheese. Low-sodium or reduced-sodium cheese. Creamcheese. Yogurt. Seasonings and condiments Fresh and dried herbs and spices. Salt-free seasonings. Low-sodium mustard and ketchup. Sodium-free salad dressing. Sodium-free light mayonnaise. Fresh orrefrigerated horseradish. Lemon juice. Vinegar. Other foods Homemade, reduced-sodium, or low-sodium soups. Unsalted popcorn and pretzels.Low-salt or salt-free chips. The items listed above may not be a complete list of foods and beverages you can eat. Contact a dietitian for more information. What foods should I avoid? Vegetables Sauerkraut, pickled vegetables, and relishes. Olives. [place] fries. Onion rings. Regular canned vegetables (not low-sodium or reduced-sodium). Regular canned tomato sauce and paste (not low-sodium or reduced-sodium). Regular tomato and vegetable juice (not low-sodium or reduced-sodium). Frozenvegetables in sauces. Grains Instant hot cereals. Bread stuffing, pancake, and biscuit mixes. Croutons. Seasoned rice or pasta mixes. Noodle soup cups. Boxed or frozen macaroni andcheese. Regular salted crackers. Self-rising flour. Meats and other proteins Meat or fish that is salted, canned, smoked, spiced, or pickled. Precooked or cured meat, such as sausages or meat loaves. Tomasa Blase. Ham. Pepperoni. Hot dogs. Corned beef. Chipped beef. Salt pork. Jerky. Pickled herring. Anchovies andsardines. Regular canned tuna. Salted nuts. Dairy Processed cheese and cheese spreads. Hard cheeses. Cheese curds. Blue cheese.Feta cheese. String cheese. Regular cottage cheese. Buttermilk.  Canned milk. Fats and oils Salted butter. Regular margarine. Ghee. Bacon fat. Seasonings and condiments Onion salt, garlic salt, seasoned salt, table salt, and sea salt. Canned and packaged gravies. Worcestershire sauce. Tartar sauce. Barbecue sauce. Teriyaki sauce. Soy sauce, including reduced-sodium. Steak sauce. Fish sauce. Oyster sauce. Cocktail sauce. Horseradish that you find on the shelf. Regular ketchup and mustard. Meat flavorings and tenderizers. Bouillon cubes. Hot sauce. Pre-made or packaged marinades. Pre-made or packaged taco seasonings. Relishes.Regular salad dressings. Salsa. Other foods Salted popcorn and pretzels. Corn chips and puffs. Potato and tortilla chips.Canned or dried soups. Pizza. Frozen entrees and pot pies. The items listed above may not be a complete list of foods and beverages you should avoid. Contact a dietitian for more information. Summary Eating less sodium can help lower your blood pressure, reduce swelling, and protect your heart, liver, and kidneys. Most people on this plan should limit their sodium intake to 1,500-2,000 mg (milligrams) of sodium each day. Canned, boxed, and frozen foods are high in sodium. Restaurant foods, fast foods, and pizza are also very high in sodium. You also get sodium by adding salt to food. Try to cook at home, eat more fresh fruits and vegetables, and eat less fast food and canned, processed, or prepared foods. This information is not intended to replace advice given to you by your health care provider. Make sure you discuss any questions you have with  your healthcare provider. Document Revised: 09/23/2019 Document Reviewed: 07/20/2019 Elsevier Patient Education  2022 Elsevier Inc.  Restora probiotic to help with colon irritation it is on [place].

## 2021-04-29 NOTE — Progress Notes (Signed)
I,Victoria Hamilton,acting as a Education administrator for Minette Brine, FNP.,have documented all relevant documentation on the behalf of Minette Brine, FNP,as directed by  Minette Brine, FNP while in the presence of Minette Brine, [place].   This visit occurred during the SARS-CoV-2 public health emergency.  Safety protocols were in place, including screening questions prior to the visit, additional usage of staff PPE, and extensive cleaning of exam room while observing appropriate contact time as indicated for disinfecting solutions.  Subjective:     Patient ID: [name] , female    DOB: 1976-01-03 , 45 y.o.   MRN: [id number]   Chief Complaint  Patient presents with   Establish Care    HPI  Pt presents today to establish care, she does have a few concerns. She has noticed a "chronic cough" for years, she takes Mucinex twice a day, every week. Very dry, hacking cough.Her cough has been present since 2020, she did not improve after the treatment. Rarely will have clear phlegm.  Hypertension is another big concern, experiencing headache, dizziness along with blurred vision for the last few months. She has also tried an albuterol inhaler which was ineffective. She has taken omeprazole in the past which was ineffective. Also took cetirizine which has been ineffecitive.  She is currently taking amlodipine.  Swelling in feet and legs, something else she has recently noticed.   She had been seeing Dr. Delia Chimes last seen approximately more than a year ago.  She had been having heart palpitations and had ECHO which was okay.    She works as a Marine scientist in [employer].  Married with 3 boys - 46 y/o, 46 y/o and 46 y/o.  She graduated from [employer].   Reports a chronic cough 9 months out of the year. She has seen an ENT, she has taken prednisone and inhalers in the past. She has been taking mucinex 2 times a day has been mildly beneficial. She stays hoarse, feels like postnasal drainage.    Takes alprazolam when flying  Wt Readings from Last 3 Encounters: 04/29/21 : 185 lb 6.4 oz (84.1 kg) 12/14/19 : 183 lb 3.2 oz (83.1 kg) 08/16/19 : 182 lb 12.8 oz (82.9 kg)      Past Medical History:  Diagnosis Date   Arthritis    right foot   Asthma    Congenital flat foot    has had bilateral foot surgery to correct   Frequent headaches    GERD (gastroesophageal reflux disease)    Hypertension    Irritable bowel syndrome (IBS)    no current med.   Painful orthopaedic hardware [facility]) 03/2013   right foot     Family History  Problem Relation Age of Onset   Hypertension Mother    Hypertension Father    Diabetes Father    Irritable bowel syndrome Father    Arrhythmia Father    Breast cancer Paternal Grandmother    Colon cancer Cousin        maternal   Rectal cancer Neg Hx    Esophageal cancer Neg Hx    Stomach cancer Neg Hx      Current Outpatient Medications:    acetaminophen (TYLENOL) 500 MG tablet, Take 1,000 mg by mouth every 6 (six) hours as needed., Disp: , Rfl:    aspirin-acetaminophen-caffeine (EXCEDRIN MIGRAINE) 250-250-65 MG tablet, Take 1 tablet by mouth every 6 (six) hours as needed for headache., Disp: , Rfl:    chlorthalidone (HYGROTON) 25 MG tablet, Take  1 tablet (25 mg total) by mouth daily., Disp: 30 tablet, Rfl: 1   levocetirizine (XYZAL) 5 MG tablet, Take 1 tablet (5 mg total) by mouth every evening., Disp: 30 tablet, Rfl: 2   omeprazole (PRILOSEC) 20 MG capsule, Take 1 capsule (20 mg total) by mouth daily., Disp: 30 capsule, Rfl: 3   Albuterol Sulfate (PROAIR RESPICLICK) 897 (90 Base) MCG/ACT AEPB, Inhale 2 puffs into the lungs every 4 hours as needed., Disp: 1 each, Rfl: 1   amLODipine (NORVASC) 10 MG tablet, TAKE 1 TABLET BY MOUTH DAILY., Disp: 90 tablet, Rfl: 0   Allergies  Allergen Reactions   Augmentin [Amoxicillin-Pot Clavulanate] Nausea And Vomiting   Biotin Other (See Comments)    Severe yeast infection   Sulfa Antibiotics Other (See  Comments)    Reaction:  Muscle pain      Review of Systems  Constitutional: Negative.   Respiratory: Negative.    Cardiovascular:  Positive for leg swelling (worse over the last few months).  Gastrointestinal: Negative.   Neurological: Negative.   Psychiatric/Behavioral: Negative.      Today's Vitals   04/29/21 1402  BP: (!) 142/98  Pulse: 68  Temp: 98.2 F (36.8 C)  SpO2: 98%  Weight: 185 lb 6.4 oz (84.1 kg)  Height: '5\' 4"'  (1.626 m)  PainSc: 0-No pain   Body mass index is 31.82 kg/m.  Wt Readings from Last 3 Encounters:  05/15/21 183 lb 3.2 oz (83.1 kg)  04/29/21 185 lb 6.4 oz (84.1 kg)  12/14/19 183 lb 3.2 oz (83.1 kg)    Objective:  Physical Exam Vitals reviewed.  Constitutional:      General: She is not in acute distress.    Appearance: Normal appearance. She is well-developed. She is obese.  HENT:     Head: Normocephalic and atraumatic.  Eyes:     Pupils: Pupils are equal, round, and reactive to light.  Cardiovascular:     Rate and Rhythm: Normal rate and regular rhythm.     Pulses: Normal pulses.     Heart sounds: Normal heart sounds. No murmur heard. Pulmonary:     Effort: Pulmonary effort is normal. No respiratory distress.     Breath sounds: Normal breath sounds. No wheezing.  Musculoskeletal:        General: Normal range of motion.  Skin:    General: Skin is warm and dry.     Capillary Refill: Capillary refill takes less than 2 seconds.  Neurological:     General: No focal deficit present.     Mental Status: She is alert and oriented to person, place, and time.     Cranial Nerves: No cranial nerve deficit.     Motor: No weakness.  Psychiatric:        Mood and Affect: Mood normal.        Behavior: Behavior normal.        Thought Content: Thought content normal.        Judgment: Judgment normal.        Assessment And Plan:     1. Hypertension, unspecified type Comments: Blood pressure is slightly elevated, this is her first visit, no changes  at this time She is also taking amlodipine and chlorthalidone - chlorthalidone (HYGROTON) 25 MG tablet; Take 1 tablet (25 mg total) by mouth daily.  Dispense: 30 tablet; Refill: 1 - CMP14+EGFR - CBC - EKG 12-Lead - POCT Urinalysis Dipstick (81002)  2. Chronic cough Comments: She is to continue using inhaler - Albuterol  Sulfate (PROAIR RESPICLICK) 916 (90 Base) MCG/ACT AEPB; Inhale 2 puffs into the lungs every 4 hours as needed.  Dispense: 1 each; Refill: 1 - levocetirizine (XYZAL) 5 MG tablet; Take 1 tablet (5 mg total) by mouth every evening.  Dispense: 30 tablet; Refill: 2 - omeprazole (PRILOSEC) 20 MG capsule; Take 1 capsule (20 mg total) by mouth daily.  Dispense: 30 capsule; Refill: 3  3. Lymphocytic colitis Comments: Did not take the budesonide due to concern about weight gain.  4. Encounter for medical examination to establish care    Patient was given opportunity to ask questions. Patient verbalized understanding of the plan and was able to repeat key elements of the plan. All questions were answered to their satisfaction.  Minette Brine, FNP   I, Minette Brine, FNP, have reviewed all documentation for this visit. The documentation on 04/29/21 for the exam, diagnosis, procedures, and orders are all accurate and complete.   IF YOU HAVE BEEN REFERRED TO A SPECIALIST, IT MAY TAKE 1-2 WEEKS TO SCHEDULE/PROCESS THE REFERRAL. IF YOU HAVE NOT HEARD FROM US/SPECIALIST IN TWO WEEKS, PLEASE GIVE [place] A CALL AT [phone].   THE PATIENT IS ENCOURAGED TO PRACTICE SOCIAL DISTANCING DUE TO THE COVID-19 PANDEMIC.

## 2021-04-30 ENCOUNTER — Other Ambulatory Visit: Payer: Self-pay | Admitting: Nurse Practitioner

## 2021-04-30 ENCOUNTER — Other Ambulatory Visit (HOSPITAL_COMMUNITY): Payer: Self-pay

## 2021-04-30 LAB — CMP14+EGFR
ALT: 11 IU/L (ref 0–32)
AST: 14 IU/L (ref 0–40)
Albumin/Globulin Ratio: 1.6 (ref 1.2–2.2)
Albumin: 4.5 g/dL (ref 3.8–4.8)
Alkaline Phosphatase: 79 IU/L (ref 44–121)
BUN/Creatinine Ratio: 11 (ref 9–23)
BUN: 8 mg/dL (ref 6–24)
Bilirubin Total: 0.4 mg/dL (ref 0.0–1.2)
CO2: 24 mmol/L (ref 20–29)
Calcium: 9.3 mg/dL (ref 8.7–10.2)
Chloride: 101 mmol/L (ref 96–106)
Creatinine, Ser: 0.76 mg/dL (ref 0.57–1.00)
Globulin, Total: 2.8 g/dL (ref 1.5–4.5)
Glucose: 84 mg/dL (ref 65–99)
Potassium: 4.1 mmol/L (ref 3.5–5.2)
Sodium: 139 mmol/L (ref 134–144)
Total Protein: 7.3 g/dL (ref 6.0–8.5)
eGFR: 98 mL/min/{1.73_m2} (ref >59)

## 2021-04-30 LAB — CBC
Hematocrit: 40.4 % (ref 34.0–46.6)
Hemoglobin: 13.6 g/dL (ref 11.1–15.9)
MCH: 29.4 pg (ref 26.6–33.0)
MCHC: 33.7 g/dL (ref 31.5–35.7)
MCV: 87 fL (ref 79–97)
Platelets: 344 10*3/uL (ref 150–450)
RBC: 4.63 x10E6/uL (ref 3.77–5.28)
RDW: 12.9 % (ref 11.7–15.4)
WBC: 3.4 10*3/uL (ref 3.4–10.8)

## 2021-05-01 ENCOUNTER — Other Ambulatory Visit (HOSPITAL_COMMUNITY): Payer: Self-pay

## 2021-05-02 ENCOUNTER — Other Ambulatory Visit (HOSPITAL_COMMUNITY): Payer: Self-pay

## 2021-05-03 ENCOUNTER — Other Ambulatory Visit (HOSPITAL_COMMUNITY): Payer: Self-pay

## 2021-05-07 ENCOUNTER — Other Ambulatory Visit (HOSPITAL_COMMUNITY): Payer: Self-pay

## 2021-05-08 ENCOUNTER — Other Ambulatory Visit (HOSPITAL_COMMUNITY): Payer: Self-pay

## 2021-05-08 ENCOUNTER — Encounter: Payer: Self-pay | Admitting: Nurse Practitioner

## 2021-05-08 ENCOUNTER — Other Ambulatory Visit: Payer: Self-pay

## 2021-05-08 DIAGNOSIS — I10 Essential (primary) hypertension: Secondary | ICD-10-CM

## 2021-05-08 MED ORDER — AMLODIPINE BESYLATE 10 MG PO TABS
ORAL_TABLET | Freq: Every day | ORAL | 0 refills | Status: DC
  Filled 2021-05-08: qty 90, 90d supply, fill #0

## 2021-05-14 ENCOUNTER — Encounter: Payer: Self-pay | Admitting: Nurse Practitioner

## 2021-05-15 ENCOUNTER — Ambulatory Visit (INDEPENDENT_AMBULATORY_CARE_PROVIDER_SITE_OTHER): Payer: No Typology Code available for payment source | Admitting: Nurse Practitioner

## 2021-05-15 ENCOUNTER — Encounter: Payer: Self-pay | Admitting: Nurse Practitioner

## 2021-05-15 ENCOUNTER — Other Ambulatory Visit: Payer: Self-pay

## 2021-05-15 VITALS — BP 134/82 | HR 78 | Temp 99.3°F | Ht 65.0 in | Wt 183.2 lb

## 2021-05-15 DIAGNOSIS — E6609 Other obesity due to excess calories: Secondary | ICD-10-CM | POA: Diagnosis not present

## 2021-05-15 DIAGNOSIS — Z6830 Body mass index (BMI) 30.0-30.9, adult: Secondary | ICD-10-CM

## 2021-05-15 DIAGNOSIS — R002 Palpitations: Secondary | ICD-10-CM

## 2021-05-15 DIAGNOSIS — Z1159 Encounter for screening for other viral diseases: Secondary | ICD-10-CM

## 2021-05-15 NOTE — Progress Notes (Signed)
I,Tianna Badgett,acting as a Neurosurgeon for [employer], FNP.,have documented all relevant documentation on the behalf of Arnette Felts, FNP,as directed by  Arnette Felts, FNP while in the presence of Arnette Felts, FNP.  This visit occurred during the SARS-CoV-2 public health emergency.  Safety protocols were in place, including screening questions prior to the visit, additional usage of staff PPE, and extensive cleaning of exam room while observing appropriate contact time as indicated for disinfecting solutions.  Subjective:     Patient ID: [name] , female    DOB: Aug 13, 1976 , 45 y.o.   MRN: [id number]   Chief Complaint  Patient presents with   Palpitations    HPI  Patient is here today for palpitations. She states that this issue started Saturday. Reports this has occurred in the past around 2018. She does admit to drinking caffeine. She is also under stress. She was sitting watching TV. Prior to having the heart palpitations she was not drinking much water. Her father has a history of heart disease and was given an ablation, did not get a pacemaker. She has seen Dr. Flora Lipps in the past. She did wear a halter monitor in the past  BP Readings from Last 3 Encounters: 05/15/21 : 134/82 04/29/21 : (!) 142/98 09/11/20 : (!) 155/106    Palpitations  This is a chronic problem. The symptoms are aggravated by caffeine and stress. Associated symptoms include coughing. Pertinent negatives include no anxiety. Associated symptoms comments: She would have to take a deep breath during the episode. Risk factors include obesity and sedentary lifestyle. There is no history of anxiety.    Past Medical History:  Diagnosis Date   Arthritis    right foot   Asthma    Congenital flat foot    has had bilateral foot surgery to correct   Frequent headaches    GERD (gastroesophageal reflux disease)    Hypertension    Irritable bowel syndrome (IBS)    no current med.   Painful orthopaedic hardware  [facility]) 03/2013   right foot     Family History  Problem Relation Age of Onset   Hypertension Mother    Hypertension Father    Diabetes Father    Irritable bowel syndrome Father    Arrhythmia Father    Breast cancer Paternal Grandmother    Colon cancer Cousin        maternal   Rectal cancer Neg Hx    Esophageal cancer Neg Hx    Stomach cancer Neg Hx      Current Outpatient Medications:    acetaminophen (TYLENOL) 500 MG tablet, Take 1,000 mg by mouth every 6 (six) hours as needed., Disp: , Rfl:    Albuterol Sulfate (PROAIR RESPICLICK) 108 (90 Base) MCG/ACT AEPB, Inhale 2 puffs into the lungs every 4 hours as needed., Disp: 1 each, Rfl: 1   amLODipine (NORVASC) 10 MG tablet, TAKE 1 TABLET BY MOUTH DAILY., Disp: 90 tablet, Rfl: 0   aspirin-acetaminophen-caffeine (EXCEDRIN MIGRAINE) 250-250-65 MG tablet, Take 1 tablet by mouth every 6 (six) hours as needed for headache., Disp: , Rfl:    chlorthalidone (HYGROTON) 25 MG tablet, Take 1 tablet (25 mg total) by mouth daily., Disp: 30 tablet, Rfl: 1   levocetirizine (XYZAL) 5 MG tablet, Take 1 tablet (5 mg total) by mouth every evening., Disp: 30 tablet, Rfl: 2   omeprazole (PRILOSEC) 20 MG capsule, Take 1 capsule (20 mg total) by mouth daily., Disp: 30 capsule, Rfl: 3   Allergies  Allergen Reactions   Augmentin [Amoxicillin-Pot Clavulanate] Nausea And Vomiting   Biotin Other (See Comments)    Severe yeast infection   Sulfa Antibiotics Other (See Comments)    Reaction:  Muscle pain      Review of Systems  Constitutional: Negative.   Respiratory:  Positive for cough.   Cardiovascular:  Positive for palpitations.  Gastrointestinal: Negative.   Neurological: Negative.   Psychiatric/Behavioral:  The patient is not nervous/anxious.     Today's Vitals   05/15/21 1417  BP: 134/82  Pulse: 78  Temp: 99.3 F (37.4 C)  TempSrc: Oral  Weight: 183 lb 3.2 oz (83.1 kg)  Height: 5\' 5"  (1.651 m)   Body mass index is 30.49 kg/m.    Objective:  Physical Exam Vitals reviewed.  Constitutional:      General: She is not in acute distress.    Appearance: Normal appearance. She is well-developed. She is obese.  Cardiovascular:     Rate and Rhythm: Normal rate and regular rhythm.     Pulses: Normal pulses.     Heart sounds: Normal heart sounds. No murmur heard. Pulmonary:     Effort: Pulmonary effort is normal. No respiratory distress.     Breath sounds: Normal breath sounds. No wheezing.  Musculoskeletal:        General: Normal range of motion.  Skin:    General: Skin is warm and dry.     Capillary Refill: Capillary refill takes less than 2 seconds.  Neurological:     General: No focal deficit present.     Mental Status: She is alert and oriented to person, place, and time.     Cranial Nerves: No cranial nerve deficit.     Motor: No weakness.  Psychiatric:        Mood and Affect: Mood normal.        Behavior: Behavior normal.        Thought Content: Thought content normal.        Judgment: Judgment normal.        Assessment And Plan:       1. Palpitation Comments: EKG done with SR HR 82 Encouraged to avoid caffeine Will check metabolic cause She may need to follow up with Dr. pending lab results She is also encouraged to increase water intake, this can cause palpitation if you are dehydrated - EKG 12-Lead - Vitamin B12 - TSH  2. Class 1 obesity due to excess calories with serious comorbidity and body mass index (BMI) of 30.0 to 30.9 in adult Chronic Discussed healthy diet and regular exercise options  Encouraged to exercise at least 150 minutes per week with 2 days of strength training  3. Encounter for hepatitis C screening test for low risk patient Will check Hepatitis C screening due to recent recommendations to screen all adults 18 years and older - Hepatitis C antibody  Patient was given opportunity to ask questions. Patient verbalized understanding of the plan and was able to repeat  key elements of the plan. All questions were answered to their satisfaction.  Scharlene Gloss, FNP   I, Arnette Felts, FNP, have reviewed all documentation for this visit. The documentation on 05/15/21 for the exam, diagnosis, procedures, and orders are all accurate and complete.   IF YOU HAVE BEEN REFERRED TO A SPECIALIST, IT MAY TAKE 1-2 WEEKS TO SCHEDULE/PROCESS THE REFERRAL. IF YOU HAVE NOT HEARD FROM US/SPECIALIST IN TWO WEEKS, PLEASE GIVE 05/17/21 A CALL AT [phone].   THE PATIENT  IS ENCOURAGED TO PRACTICE SOCIAL DISTANCING DUE TO THE COVID-19 PANDEMIC.    

## 2021-05-15 NOTE — Patient Instructions (Signed)
Palpitations Palpitations are feelings that your heartbeat is not normal. Your heartbeat may feel like it is: Uneven. Faster than normal. Fluttering. Skipping a beat. This is usually not a serious problem. In some cases, you may need tests to rule out any serious problems. Follow these instructions at home: Pay attention to any changes in your condition. Take these actions to help manage your symptoms: Eating and drinking Avoid: Coffee, tea, soft drinks, and energy drinks. Chocolate. Alcohol. Diet pills. Lifestyle  Try to lower your stress. These things can help you relax: Yoga. Deep breathing and meditation. Exercise. Using words and images to create positive thoughts (guided imagery). Using your mind to control things in your body (biofeedback). Do not use drugs. Get plenty of rest and sleep. Keep a regular bed time. General instructions  Take over-the-counter and prescription medicines only as told by your doctor. Do not use any products that contain nicotine or tobacco, such as cigarettes and e-cigarettes. If you need help quitting, ask your doctor. Keep all follow-up visits as told by your doctor. This is important. You may need more tests if palpitations do not go away or get worse. Contact a doctor if: Your symptoms last more than 24 hours. Your symptoms occur more often. Get help right away if you: Have chest pain. Feel short of breath. Have a very bad headache. Feel dizzy. Pass out (faint). Summary Palpitations are feelings that your heartbeat is uneven or faster than normal. It may feel like your heart is fluttering or skipping a beat. Avoid food and drinks that may cause palpitations. These include caffeine, chocolate, and alcohol. Try to lower your stress. Do not smoke or use drugs. Get help right away if you faint or have chest pain, shortness of breath, a severe headache, or dizziness. This information is not intended to replace advice given to you by your  health care provider. Make sure you discuss any questions you have with your health care provider. Document Revised: 12/14/2020 Document Reviewed: 09/30/2017 Elsevier Patient Education  2022 Elsevier Inc.  

## 2021-05-16 LAB — TSH: TSH: 0.969 u[IU]/mL (ref 0.450–4.500)

## 2021-05-16 LAB — HEPATITIS C ANTIBODY: Hep C Virus Ab: <0.1 s/co ratio (ref 0.0–0.9)

## 2021-05-16 LAB — VITAMIN B12: Vitamin B-12: 428 pg/mL (ref 232–1245)

## 2021-06-10 ENCOUNTER — Other Ambulatory Visit (HOSPITAL_COMMUNITY): Payer: Self-pay

## 2021-06-11 ENCOUNTER — Encounter: Payer: Self-pay | Admitting: Nurse Practitioner

## 2021-06-11 ENCOUNTER — Other Ambulatory Visit: Payer: Self-pay

## 2021-06-11 ENCOUNTER — Ambulatory Visit (INDEPENDENT_AMBULATORY_CARE_PROVIDER_SITE_OTHER): Payer: No Typology Code available for payment source | Admitting: Nurse Practitioner

## 2021-06-11 ENCOUNTER — Other Ambulatory Visit (HOSPITAL_COMMUNITY): Payer: Self-pay

## 2021-06-11 VITALS — BP 124/82 | HR 77 | Temp 99.0°F | Ht 63.8 in | Wt 184.0 lb

## 2021-06-11 DIAGNOSIS — I10 Essential (primary) hypertension: Secondary | ICD-10-CM

## 2021-06-11 DIAGNOSIS — Z2821 Immunization not carried out because of patient refusal: Secondary | ICD-10-CM | POA: Diagnosis not present

## 2021-06-11 DIAGNOSIS — R053 Chronic cough: Secondary | ICD-10-CM | POA: Diagnosis not present

## 2021-06-11 MED ORDER — BUDESONIDE-FORMOTEROL FUMARATE 80-4.5 MCG/ACT IN AERO
2.0000 | INHALATION_SPRAY | Freq: Two times a day (BID) | RESPIRATORY_TRACT | 3 refills | Status: DC
  Filled 2021-06-11: qty 10.2, 30d supply, fill #0

## 2021-06-11 NOTE — Patient Instructions (Signed)

## 2021-06-11 NOTE — Progress Notes (Signed)
I,Yamilka J Llittleton,acting as a Education administrator for [employer], FNP.,have documented all relevant documentation on the behalf of Minette Brine, FNP,as directed by  Minette Brine, FNP while in the presence of Minette Brine, [place].   This visit occurred during the SARS-CoV-2 public health emergency.  Safety protocols were in place, including screening questions prior to the visit, additional usage of staff PPE, and extensive cleaning of exam room while observing appropriate contact time as indicated for disinfecting solutions.  Subjective:     Patient ID: [name] , female    DOB: 1976/01/06 , 45 y.o.   MRN: [id number]   Chief Complaint  Patient presents with   Hypertension    HPI  Patient presents today for a bp check. She is taking chlorthalidone which has been effective. Continues to have hacking cough with xyzal and omeprazole. Initially thought was getting better then started back again with dry hacking cough.   Wt Readings from Last 3 Encounters: 06/11/21 : 184 lb (83.5 kg) 05/15/21 : 183 lb 3.2 oz (83.1 kg) 04/29/21 : 185 lb 6.4 oz (84.1 kg)     Hypertension This is a chronic problem. The current episode started more than 1 year ago. The problem is uncontrolled. Pertinent negatives include no anxiety. There are no associated agents to hypertension.  Cough    Past Medical History:  Diagnosis Date   Arthritis    right foot   Asthma    Congenital flat foot    has had bilateral foot surgery to correct   Frequent headaches    GERD (gastroesophageal reflux disease)    Hypertension    Irritable bowel syndrome (IBS)    no current med.   Painful orthopaedic hardware [facility]) 03/2013   right foot     Family History  Problem Relation Age of Onset   Hypertension Mother    Hypertension Father    Diabetes Father    Irritable bowel syndrome Father    Arrhythmia Father    Breast cancer Paternal Grandmother    Colon cancer Cousin        maternal   Rectal cancer Neg Hx     Esophageal cancer Neg Hx    Stomach cancer Neg Hx      Current Outpatient Medications:    acetaminophen (TYLENOL) 500 MG tablet, Take 1,000 mg by mouth every 6 (six) hours as needed., Disp: , Rfl:    amLODipine (NORVASC) 10 MG tablet, TAKE 1 TABLET BY MOUTH DAILY., Disp: 90 tablet, Rfl: 0   aspirin-acetaminophen-caffeine (EXCEDRIN MIGRAINE) 250-250-65 MG tablet, Take 1 tablet by mouth every 6 (six) hours as needed for headache., Disp: , Rfl:    budesonide-formoterol (SYMBICORT) 80-4.5 MCG/ACT inhaler, Inhale 2 puffs into the lungs in the morning and at bedtime., Disp: 10.2 g, Rfl: 3   chlorthalidone (HYGROTON) 25 MG tablet, Take 1 tablet (25 mg total) by mouth daily., Disp: 30 tablet, Rfl: 1   levocetirizine (XYZAL) 5 MG tablet, Take 1 tablet (5 mg total) by mouth every evening., Disp: 30 tablet, Rfl: 2   omeprazole (PRILOSEC) 20 MG capsule, Take 1 capsule (20 mg total) by mouth daily., Disp: 30 capsule, Rfl: 3   Albuterol Sulfate (PROAIR RESPICLICK) 616 (90 Base) MCG/ACT AEPB, Inhale 2 puffs into the lungs every 4 hours as needed. (Patient not taking: Reported on 06/11/2021), Disp: 1 each, Rfl: 1   Allergies  Allergen Reactions   Augmentin [Amoxicillin-Pot Clavulanate] Nausea And Vomiting   Biotin Other (See Comments)    Severe yeast  infection   Sulfa Antibiotics Other (See Comments)    Reaction:  Muscle pain      Review of Systems  Constitutional: Negative.   Respiratory:  Positive for cough.   Cardiovascular: Negative.   Neurological: Negative.   Psychiatric/Behavioral: Negative.      Today's Vitals   06/11/21 1607  BP: 124/82  Pulse: 77  Temp: 99 F (37.2 C)  Weight: 184 lb (83.5 kg)  Height: 5' 3.8" (1.621 m)  PainSc: 0-No pain   Body mass index is 31.78 kg/m.   Objective:  Physical Exam Vitals reviewed.  Constitutional:      General: She is not in acute distress.    Appearance: Normal appearance. She is well-developed. She is obese.  Cardiovascular:     Rate  and Rhythm: Normal rate and regular rhythm.     Pulses: Normal pulses.     Heart sounds: Normal heart sounds. No murmur heard. Pulmonary:     Effort: Pulmonary effort is normal. No respiratory distress.     Breath sounds: Normal breath sounds. No wheezing.  Musculoskeletal:        General: Normal range of motion.  Skin:    General: Skin is warm and dry.     Capillary Refill: Capillary refill takes less than 2 seconds.  Neurological:     General: No focal deficit present.     Mental Status: She is alert and oriented to person, place, and time.     Cranial Nerves: No cranial nerve deficit.     Motor: No weakness.  Psychiatric:        Mood and Affect: Mood normal.        Behavior: Behavior normal.        Thought Content: Thought content normal.        Judgment: Judgment normal.        Assessment And Plan:     1. Hypertension, unspecified type Comments: Blood pressure is improving with the chlorthalidone, continue current dose - BMP8+eGFR  2. Persistent cough Comments: Will check CXR, will also send Rx for symbicort. She is to pick up her albuterol inhaler - budesonide-formoterol (SYMBICORT) 80-4.5 MCG/ACT inhaler; Inhale 2 puffs into the lungs in the morning and at bedtime.  Dispense: 10.2 g; Refill: 3 - DG Chest 2 View; Future  3. Influenza vaccination declined  Patient declined influenza vaccination at this time. Patient is aware that influenza vaccine prevents illness in 70% of healthy people, and reduces hospitalizations to 30-70% in elderly. This vaccine is recommended annually. Pt is willing to accept risk associated with refusing vaccination.   Patient was given opportunity to ask questions. Patient verbalized understanding of the plan and was able to repeat key elements of the plan. All questions were answered to their satisfaction.  Minette Brine, FNP   I, Minette Brine, FNP, have reviewed all documentation for this visit. The documentation on 06/11/21 for the exam,  diagnosis, procedures, and orders are all accurate and complete.   IF YOU HAVE BEEN REFERRED TO A SPECIALIST, IT MAY TAKE 1-2 WEEKS TO SCHEDULE/PROCESS THE REFERRAL. IF YOU HAVE NOT HEARD FROM US/SPECIALIST IN TWO WEEKS, PLEASE GIVE [place] A CALL AT [phone].   THE PATIENT IS ENCOURAGED TO PRACTICE SOCIAL DISTANCING DUE TO THE COVID-19 PANDEMIC.

## 2021-06-12 ENCOUNTER — Other Ambulatory Visit (HOSPITAL_COMMUNITY): Payer: Self-pay

## 2021-06-12 ENCOUNTER — Encounter: Payer: Self-pay | Admitting: Nurse Practitioner

## 2021-06-12 LAB — BMP8+EGFR
BUN/Creatinine Ratio: 8 — ABNORMAL LOW (ref 9–23)
BUN: 8 mg/dL (ref 6–24)
CO2: 28 mmol/L (ref 20–29)
Calcium: 9.1 mg/dL (ref 8.7–10.2)
Chloride: 101 mmol/L (ref 96–106)
Creatinine, Ser: 1.01 mg/dL — ABNORMAL HIGH (ref 0.57–1.00)
Glucose: 88 mg/dL (ref 70–99)
Potassium: 4 mmol/L (ref 3.5–5.2)
Sodium: 139 mmol/L (ref 134–144)
eGFR: 70 mL/min/{1.73_m2} (ref >59)

## 2021-06-13 ENCOUNTER — Other Ambulatory Visit: Payer: Self-pay

## 2021-06-13 ENCOUNTER — Ambulatory Visit (HOSPITAL_COMMUNITY)
Admission: RE | Admit: 2021-06-13 | Discharge: 2021-06-13 | Disposition: A | Discharge status: Home / Self Care | Payer: No Typology Code available for payment source | Source: Ambulatory Visit | Attending: Nurse Practitioner | Admitting: Nurse Practitioner

## 2021-06-13 DIAGNOSIS — R053 Chronic cough: Secondary | ICD-10-CM | POA: Diagnosis present

## 2021-06-13 NOTE — Addendum Note (Signed)
Addended by: Arnette Felts F on: 06/13/2021 10:58 AM   Modules accepted: Orders

## 2021-06-14 ENCOUNTER — Other Ambulatory Visit: Payer: Self-pay | Admitting: Nurse Practitioner

## 2021-06-14 ENCOUNTER — Other Ambulatory Visit (HOSPITAL_COMMUNITY): Payer: Self-pay

## 2021-06-14 DIAGNOSIS — R053 Chronic cough: Secondary | ICD-10-CM

## 2021-06-14 MED ORDER — SYMBICORT 80-4.5 MCG/ACT IN AERO
2.0000 | INHALATION_SPRAY | Freq: Two times a day (BID) | RESPIRATORY_TRACT | 3 refills | Status: DC
Start: 2021-06-14 — End: 2022-03-31
  Filled 2021-06-14: qty 10.2, 30d supply, fill #0

## 2021-06-19 ENCOUNTER — Other Ambulatory Visit (HOSPITAL_COMMUNITY): Payer: Self-pay

## 2021-07-31 ENCOUNTER — Encounter: Payer: No Typology Code available for payment source | Admitting: Nurse Practitioner

## 2021-08-01 ENCOUNTER — Other Ambulatory Visit: Payer: Self-pay

## 2021-08-01 ENCOUNTER — Encounter: Payer: Self-pay | Admitting: Podiatry

## 2021-08-01 ENCOUNTER — Ambulatory Visit (INDEPENDENT_AMBULATORY_CARE_PROVIDER_SITE_OTHER): Payer: No Typology Code available for payment source | Admitting: Podiatry

## 2021-08-01 ENCOUNTER — Ambulatory Visit (INDEPENDENT_AMBULATORY_CARE_PROVIDER_SITE_OTHER): Payer: No Typology Code available for payment source

## 2021-08-01 DIAGNOSIS — M778 Other enthesopathies, not elsewhere classified: Secondary | ICD-10-CM

## 2021-08-01 MED ORDER — TRIAMCINOLONE ACETONIDE 10 MG/ML IJ SUSP
10.0000 mg | Freq: Once | INTRAMUSCULAR | Status: AC
  Administered 2021-08-01: 10 mg

## 2021-08-01 NOTE — Progress Notes (Signed)
Subjective:   Patient ID: [name], female   DOB: 45 y.o.   MRN: [id number]   HPI Patient presents with pain in the left ankle and states that it did well for about 5 months and is starting to recur with significant flatfoot deformity noted bilateral   ROS      Objective:  Physical Exam  Neurovascular status intact moderate reduction of range of motion of the subtalar joint with inflammation of the subtalar joint left and also around the peroneal tendons underneath the lateral ankle.  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  At this point evaluated orthotics and she does need a new pair as its been almost 4 years and I went ahead today and casted for functional orthotics and then did careful injection of the sinus tarsi and the lateral ankle subtalar joint 3 mg Kenalog 5 mg Xylocaine advised on reduced activity and reappoint when orthotics are returned  X-ray indicates that there is significant arthritis subtalar joint bilateral flatfoot deformity with history of structural bunion correction

## 2021-08-08 ENCOUNTER — Encounter: Payer: No Typology Code available for payment source | Admitting: Nurse Practitioner

## 2021-09-06 ENCOUNTER — Telehealth: Payer: Self-pay | Admitting: Podiatry

## 2021-09-06 NOTE — Telephone Encounter (Signed)
Orthotics in.. lvm for pt to call to schedule an appt to pick them up. °

## 2021-09-30 ENCOUNTER — Ambulatory Visit (INDEPENDENT_AMBULATORY_CARE_PROVIDER_SITE_OTHER): Payer: No Typology Code available for payment source

## 2021-09-30 ENCOUNTER — Other Ambulatory Visit: Payer: Self-pay

## 2021-09-30 DIAGNOSIS — M778 Other enthesopathies, not elsewhere classified: Secondary | ICD-10-CM

## 2021-09-30 NOTE — Progress Notes (Signed)
SITUATION: Reason for Visit: Fitting and Delivery of Custom Fabricated Foot Orthoses Patient Report: Patient reports comfort and is satisfied with device.  OBJECTIVE DATA: Patient History / Diagnosis:     ICD-10-CM   1. Capsulitis of foot, left  M77.8       Provided Device:  Custom Functional Foot Orthotics     [facility]: [id number]  GOAL OF ORTHOSIS - Improve gait - Decrease energy expenditure - Improve Balance - Provide Triplanar stability of foot complex - Facilitate motion  ACTIONS PERFORMED Patient was fit with foot orthotics trimmed to shoe last. Patient tolerated fittign procedure.   Patient was provided with verbal and written instruction and demonstration regarding donning, doffing, wear, care, proper fit, function, purpose, cleaning, and use of the orthosis and in all related precautions and risks and benefits regarding the orthosis.  Patient was also provided with verbal instruction regarding how to report any failures or malfunctions of the orthosis and necessary follow up care. Patient was also instructed to contact our office regarding any change in status that may affect the function of the orthosis.  Patient demonstrated independence with proper donning, doffing, and fit and verbalized understanding of all instructions.  PLAN: Patient is to follow up in one week or as necessary (PRN). All questions were answered and concerns addressed. Plan of care was discussed with and agreed upon by the patient.

## 2022-01-13 ENCOUNTER — Other Ambulatory Visit (HOSPITAL_COMMUNITY): Payer: Self-pay

## 2022-01-14 ENCOUNTER — Other Ambulatory Visit (HOSPITAL_COMMUNITY): Payer: Self-pay

## 2022-01-15 ENCOUNTER — Other Ambulatory Visit (HOSPITAL_COMMUNITY): Payer: Self-pay

## 2022-01-15 MED ORDER — TRAMADOL HCL 50 MG PO TABS
ORAL_TABLET | ORAL | 0 refills | Status: DC
  Filled 2022-01-15: qty 20, 5d supply, fill #0

## 2022-02-20 ENCOUNTER — Other Ambulatory Visit: Payer: Self-pay | Admitting: Nurse Practitioner

## 2022-02-20 ENCOUNTER — Other Ambulatory Visit (HOSPITAL_COMMUNITY): Payer: Self-pay

## 2022-02-20 DIAGNOSIS — I10 Essential (primary) hypertension: Secondary | ICD-10-CM

## 2022-02-20 MED ORDER — AMLODIPINE BESYLATE 10 MG PO TABS
ORAL_TABLET | Freq: Every day | ORAL | 0 refills | Status: DC
  Filled 2022-02-20: qty 90, 90d supply, fill #0

## 2022-02-26 ENCOUNTER — Other Ambulatory Visit (HOSPITAL_COMMUNITY): Payer: Self-pay

## 2022-03-31 ENCOUNTER — Encounter: Payer: Self-pay | Admitting: Family Medicine

## 2022-03-31 ENCOUNTER — Ambulatory Visit (INDEPENDENT_AMBULATORY_CARE_PROVIDER_SITE_OTHER): Payer: No Typology Code available for payment source | Admitting: Family Medicine

## 2022-03-31 ENCOUNTER — Other Ambulatory Visit (HOSPITAL_COMMUNITY): Payer: Self-pay

## 2022-03-31 VITALS — BP 170/110 | HR 62 | Temp 98.3°F | Ht 64.0 in | Wt 194.3 lb

## 2022-03-31 DIAGNOSIS — I10 Essential (primary) hypertension: Secondary | ICD-10-CM | POA: Diagnosis not present

## 2022-03-31 DIAGNOSIS — E559 Vitamin D deficiency, unspecified: Secondary | ICD-10-CM | POA: Diagnosis not present

## 2022-03-31 LAB — VITAMIN D 25 HYDROXY (VIT D DEFICIENCY, FRACTURES): VITD: 7.32 ng/mL — ABNORMAL LOW (ref 30.00–100.00)

## 2022-03-31 MED ORDER — VITAMIN D (ERGOCALCIFEROL) 1.25 MG (50000 UNIT) PO CAPS
50000.0000 [IU] | ORAL_CAPSULE | ORAL | 11 refills | Status: DC
  Filled 2022-03-31: qty 5, 35d supply, fill #0
  Filled 2022-04-30: qty 4, 28d supply, fill #1
  Filled 2022-05-28: qty 4, 28d supply, fill #2
  Filled 2022-06-27: qty 4, 28d supply, fill #3
  Filled 2022-08-07: qty 4, 28d supply, fill #4
  Filled 2022-09-02 – 2022-09-04 (×2): qty 4, 28d supply, fill #5
  Filled 2022-10-01: qty 4, 28d supply, fill #6

## 2022-03-31 MED ORDER — CHLORTHALIDONE 25 MG PO TABS
25.0000 mg | ORAL_TABLET | Freq: Every day | ORAL | 3 refills | Status: DC
Start: 2022-03-31 — End: 2022-12-08
  Filled 2022-03-31: qty 90, 90d supply, fill #0
  Filled 2022-08-07: qty 90, 90d supply, fill #1

## 2022-03-31 NOTE — Assessment & Plan Note (Signed)
Elevated on recheck today. She has been out of her chlorthalidone for some time. She continues on her amlodipine 10 mg daily. I recommend we restart the chlorthalidone 25 mg daily and she will continue to check her BP readings at home daily and send them to me on Mychart. I will continue to adjust her BP medication at each visit. If it continues to be elevated despite multiple medications then I would recommend getting a renal artery [place] to rule out RAS. This information was relayed to the patient and I recommend she continue to reduce salt in her diet.

## 2022-03-31 NOTE — Addendum Note (Signed)
Addended by: Karie Georges on: 03/31/2022 03:39 PM   Modules accepted: Orders

## 2022-03-31 NOTE — Assessment & Plan Note (Signed)
Patient has not had a follow up level since 2021. I have ordered a new level to follow up on this condition and determine if she requires continued treatment of this.

## 2022-03-31 NOTE — Progress Notes (Signed)
New Patient Office Visit  Subjective    Patient ID: [name], female    DOB: Oct 15, 1975  Age: 46 y.o. MRN: [id number]  CC:  Chief Complaint  Patient presents with   Establish Care    HPI [name] presents to establish care She was previously been seen by internal medicine. Needs a complete physical performed for her job. We reviewed her medications and her current problem list. 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  #Hypertension Her high blood pressure was first noted  several years ago. She reports a history of HTN and preeclampsia with each of her pregnancies.  Home blood pressure readings: usual 160-170/90-110 . Salt intake and diet: salt not added to cooking. Associated signs and symptoms: headache. Patient denies: chest pain, dyspnea, and palpitations. Use of agents associated with hypertension: none. Medication compliance: taking as prescribed.  The following portions of the patient's history were reviewed and updated as appropriate: current medications, past family history, past medical history, past social history, past surgical history, and problem list.   Outpatient Encounter Medications as of 03/31/2022  Medication Sig   acetaminophen (TYLENOL) 500 MG tablet Take 1,000 mg by mouth every 6 (six) hours as needed.   amLODipine (NORVASC) 10 MG tablet TAKE 1 TABLET BY MOUTH DAILY.   aspirin-acetaminophen-caffeine (EXCEDRIN MIGRAINE) 250-250-65 MG tablet Take 1 tablet by mouth every 6 (six) hours as needed for headache.   chlorthalidone (HYGROTON) 25 MG tablet Take 1 tablet (25 mg total) by mouth daily.   levocetirizine (XYZAL) 5 MG tablet Take 1 tablet (5 mg total) by mouth every evening. (Patient taking differently: Take 5 mg by mouth as needed.)   levonorgestrel (MIRENA) 20 MCG/DAY IUD 1 each by Intrauterine route once.   traMADol (ULTRAM) 50 MG tablet Take 1 tablet by mouth every six to eight hours as needed for pain    [DISCONTINUED] chlorthalidone (HYGROTON) 25 MG tablet Take 1 tablet (25 mg total) by mouth daily.   [DISCONTINUED] Albuterol Sulfate (PROAIR RESPICLICK) 108 (90 Base) MCG/ACT AEPB Inhale 2 puffs into the lungs every 4 hours as needed. (Patient not taking: Reported on 06/11/2021)   [DISCONTINUED] omeprazole (PRILOSEC) 20 MG capsule Take 1 capsule (20 mg total) by mouth daily.   [DISCONTINUED] SYMBICORT 80-4.5 MCG/ACT inhaler Inhale 2 puffs into the lungs in the morning and at bedtime.   No facility-administered encounter medications on file as of 03/31/2022.    Past Medical History:  Diagnosis Date   Arthritis    right foot   Asthma    Congenital flat foot    has had bilateral foot surgery to correct   Frequent headaches    GERD (gastroesophageal reflux disease)    Hypertension    Irritable bowel syndrome (IBS)    no current med.   Painful orthopaedic hardware [facility]) 03/2013   right foot    Past Surgical History:  Procedure Laterality Date   CALCANEAL OSTEOTOMY Right 12/23/2012   Procedure: RIGHT CALCANEAL OSTEOTOMY, RIGHT LATERAL COLUMN LENGTHENING, RIGHT COTTON OSTEOTOMY, RIGHT FLEX DIGITORIUM LONGUS TRANSFER, RIGHT KIDNER PROCEDURE ;  Surgeon: Toni Arthurs, MD;  Location: [facility];  Service: Orthopedics;  Laterality: Right;   CESAREAN SECTION  07/13/2005; 06/03/2006   DILATION AND EVACUATION  08/16/2008   FOOT SURGERY Left 2002   FOOT SURGERY Right 10/2018   GASTROCNEMIUS RECESSION Right 12/23/2012   Procedure: RIGHT GASTRO RECESSION ;  Surgeon: Toni Arthurs, MD;  Location: [facility];  Service: Orthopedics;  Laterality: Right;   HARDWARE REMOVAL Right 03/24/2013   Procedure: HARDWARE REMOVAL RIGHT HEEL;  Surgeon: Wylene Simmer, MD;  Location: [place];  Service: Orthopedics;  Laterality: Right;   INTRAUTERINE DEVICE INSERTION     WISDOM TOOTH EXTRACTION      Family History  Problem Relation Age of Onset   Hypertension Mother     Hypertension Father    Diabetes Father    Irritable bowel syndrome Father    Arrhythmia Father    Breast cancer Paternal Grandmother    Colon cancer Cousin        maternal   Rectal cancer Neg Hx    Esophageal cancer Neg Hx    Stomach cancer Neg Hx     Social History   Socioeconomic History   Marital status: Married    Spouse name: Not on file   Number of children: 3   Years of education: Not on file   Highest education level: Not on file  Occupational History   Occupation: Programmer, multimedia: [employer]  Tobacco Use   Smoking status: Never   Smokeless tobacco: Never  Vaping Use   Vaping Use: Never used  Substance and Sexual Activity   Alcohol use: No   Drug use: No   Sexual activity: Yes    Birth control/protection: I.U.D.  Other Topics Concern   Not on file  Social History Narrative   Not on file   Social Determinants of Health   Financial Resource Strain: Not on file  Food Insecurity: Not on file  Transportation Needs: Not on file  Physical Activity: Not on file  Stress: Not on file  Social Connections: Not on file  Intimate Partner Violence: Not on file    Review of Systems  All other systems reviewed and are negative.       Objective    BP (!) 170/110 (BP Location: Right Arm, Patient Position: Sitting, Cuff Size: Large)   Pulse 62   Temp 98.3 F (36.8 C) (Oral)   Ht 5\' 4"  (1.626 m)   Wt 194 lb 4.8 oz (88.1 kg)   SpO2 98%   BMI 33.35 kg/m   Physical Exam Vitals reviewed.  Constitutional:      Appearance: Normal appearance. She is well-groomed and normal weight.  HENT:     Head: Normocephalic and atraumatic.     Right Ear: Tympanic membrane normal.     Left Ear: Tympanic membrane normal.     Mouth/Throat:     Mouth: Mucous membranes are moist.     Pharynx: Oropharynx is clear.  Eyes:     Extraocular Movements: Extraocular movements intact.     Conjunctiva/sclera: Conjunctivae normal.     Pupils: Pupils are equal, round, and reactive to  light.  Cardiovascular:     Rate and Rhythm: Normal rate and regular rhythm.     Pulses: Normal pulses.     Heart sounds: S1 normal and S2 normal.  Pulmonary:     Effort: Pulmonary effort is normal.     Breath sounds: Normal breath sounds and air entry.  Abdominal:     General: Abdomen is flat. Bowel sounds are normal.     Palpations: Abdomen is soft.  Musculoskeletal:        General: Normal range of motion.     Cervical back: Normal range of motion and neck supple.     Right lower leg: No edema.     Left  lower leg: No edema.  Skin:    General: Skin is warm and dry.  Neurological:     Mental Status: She is alert and oriented to person, place, and time. Mental status is at baseline.     Gait: Gait is intact.  Psychiatric:        Mood and Affect: Mood and affect normal.        Speech: Speech normal.        Behavior: Behavior normal.        Judgment: Judgment normal.       Assessment & Plan:   Problem List Items Addressed This Visit       Cardiovascular and Mediastinum   HTN (hypertension) - Primary    Elevated on recheck today. She has been out of her chlorthalidone for some time. She continues on her amlodipine 10 mg daily. I recommend we restart the chlorthalidone 25 mg daily and she will continue to check her BP readings at home daily and send them to me on Mychart. I will continue to adjust her BP medication at each visit. If it continues to be elevated despite multiple medications then I would recommend getting a renal artery [place] to rule out RAS. This information was relayed to the patient and I recommend she continue to reduce salt in her diet.      Relevant Medications   chlorthalidone (HYGROTON) 25 MG tablet     Other   Vitamin D deficiency (Chronic)    Patient has not had a follow up level since 2021. I have ordered a new level to follow up on this condition and determine if she requires continued treatment of this.       Relevant Orders   Vitamin D, 25-hydroxy     Return in about 3 months (around 07/01/2022) for Well womand visit.   Karie Georges, MD

## 2022-04-01 ENCOUNTER — Other Ambulatory Visit (HOSPITAL_COMMUNITY): Payer: Self-pay

## 2022-04-30 ENCOUNTER — Other Ambulatory Visit: Payer: Self-pay | Admitting: Nurse Practitioner

## 2022-04-30 ENCOUNTER — Other Ambulatory Visit (HOSPITAL_COMMUNITY): Payer: Self-pay

## 2022-04-30 DIAGNOSIS — R053 Chronic cough: Secondary | ICD-10-CM

## 2022-04-30 MED ORDER — TRAMADOL HCL 50 MG PO TABS
ORAL_TABLET | ORAL | 0 refills | Status: DC
  Filled 2022-04-30: qty 20, 7d supply, fill #0

## 2022-05-01 ENCOUNTER — Other Ambulatory Visit (HOSPITAL_COMMUNITY): Payer: Self-pay

## 2022-05-02 ENCOUNTER — Encounter: Payer: Self-pay | Admitting: Family Medicine

## 2022-05-02 ENCOUNTER — Other Ambulatory Visit (HOSPITAL_COMMUNITY): Payer: Self-pay

## 2022-05-02 ENCOUNTER — Ambulatory Visit (INDEPENDENT_AMBULATORY_CARE_PROVIDER_SITE_OTHER): Payer: No Typology Code available for payment source | Admitting: Family Medicine

## 2022-05-02 DIAGNOSIS — J0110 Acute frontal sinusitis, unspecified: Secondary | ICD-10-CM | POA: Diagnosis not present

## 2022-05-02 MED ORDER — FLUCONAZOLE 150 MG PO TABS
150.0000 mg | ORAL_TABLET | ORAL | 0 refills | Status: DC
  Filled 2022-05-02: qty 2, 6d supply, fill #0

## 2022-05-02 MED ORDER — FLUCONAZOLE 150 MG PO TABS
150.0000 mg | ORAL_TABLET | ORAL | 0 refills | Status: DC
  Filled 2022-05-02: qty 2, 9d supply, fill #0

## 2022-05-02 MED ORDER — DOXYCYCLINE HYCLATE 100 MG PO TABS
100.0000 mg | ORAL_TABLET | Freq: Two times a day (BID) | ORAL | 0 refills | Status: DC
  Filled 2022-05-02: qty 20, 10d supply, fill #0

## 2022-05-02 NOTE — Progress Notes (Signed)
   Established Patient Office Visit  Subjective   Patient ID: [name], female    DOB: 08/25/1976  Age: 46 y.o. MRN: [id number]  Chief Complaint  Patient presents with  . Cough    Non-productive/productive with intermittent clear sputum x4 weeks, tried Tussin and Tylenol Cold and Flu with no relief, denies Covid testing  . Nasal Congestion    X4 weeks  . Headache    Patient complains of right-sided headache when coughing x4 weeks    Cough The current episode started 1 to 4 weeks ago. The problem has been unchanged. The problem occurs constantly. The cough is Non-productive. Associated symptoms include headaches, nasal congestion and postnasal drip. Pertinent negatives include no chills or fever. She has tried OTC cough suppressant for the symptoms. The treatment provided no relief.  Headache  Associated symptoms include coughing. Pertinent negatives include no fever.    {History (Optional):23778}  Review of Systems  Constitutional:  Negative for chills and fever.  HENT:  Positive for postnasal drip.   Respiratory:  Positive for cough.   Neurological:  Positive for headaches.  All other systems reviewed and are negative.     Objective:     BP 108/80 (BP Location: Left Arm, Patient Position: Sitting, Cuff Size: Large)   Pulse 80   Temp 98 F (36.7 C) (Oral)   Ht 5\' 4"  (1.626 m)   Wt 191 lb 1.6 oz (86.7 kg)   SpO2 100%   BMI 32.80 kg/m  {Vitals History (Optional):23777}  Physical Exam   No results found for any visits on 05/02/22.  {Labs (Optional):23779}  The 10-year ASCVD risk score (Arnett DK, et al., 2019) is: 1.2%    Assessment & Plan:   Problem List Items Addressed This Visit       Respiratory   Acute frontal sinusitis   Relevant Medications   doxycycline (VIBRA-TABS) 100 MG tablet   fluconazole (DIFLUCAN) 150 MG tablet    No follow-ups on file.    2020, MD

## 2022-05-07 NOTE — Assessment & Plan Note (Signed)
Will treat with doxycycline since she has a PCN allergy,. Will rx 100 mg BID, pt requesting diflucan in case of yeast infection while on abx, rx sent.

## 2022-05-08 ENCOUNTER — Other Ambulatory Visit (HOSPITAL_COMMUNITY): Payer: Self-pay

## 2022-05-28 ENCOUNTER — Other Ambulatory Visit (HOSPITAL_COMMUNITY): Payer: Self-pay

## 2022-06-03 ENCOUNTER — Other Ambulatory Visit (HOSPITAL_COMMUNITY): Payer: Self-pay

## 2022-06-09 ENCOUNTER — Encounter: Payer: Self-pay | Admitting: Family Medicine

## 2022-06-09 ENCOUNTER — Other Ambulatory Visit (HOSPITAL_COMMUNITY): Payer: Self-pay

## 2022-06-09 ENCOUNTER — Ambulatory Visit (INDEPENDENT_AMBULATORY_CARE_PROVIDER_SITE_OTHER): Payer: No Typology Code available for payment source | Admitting: Family Medicine

## 2022-06-09 VITALS — BP 118/84 | HR 65 | Temp 98.8°F | Ht 63.5 in | Wt 185.0 lb

## 2022-06-09 DIAGNOSIS — R053 Chronic cough: Secondary | ICD-10-CM

## 2022-06-09 DIAGNOSIS — I10 Essential (primary) hypertension: Secondary | ICD-10-CM

## 2022-06-09 DIAGNOSIS — J3089 Other allergic rhinitis: Secondary | ICD-10-CM

## 2022-06-09 DIAGNOSIS — Z0000 Encounter for general adult medical examination without abnormal findings: Secondary | ICD-10-CM

## 2022-06-09 DIAGNOSIS — E559 Vitamin D deficiency, unspecified: Secondary | ICD-10-CM

## 2022-06-09 LAB — LIPID PANEL
Cholesterol: 151 mg/dL (ref 0–200)
HDL: 41.3 mg/dL (ref >39.00)
LDL Cholesterol: 96 mg/dL (ref 0–99)
NonHDL: 109.3
Total CHOL/HDL Ratio: 4
Triglycerides: 68 mg/dL (ref 0.0–149.0)
VLDL: 13.6 mg/dL (ref 0.0–40.0)

## 2022-06-09 LAB — COMPREHENSIVE METABOLIC PANEL
ALT: 9 U/L (ref 0–35)
AST: 14 U/L (ref 0–37)
Albumin: 4.2 g/dL (ref 3.5–5.2)
Alkaline Phosphatase: 61 U/L (ref 39–117)
BUN: 12 mg/dL (ref 6–23)
CO2: 31 mEq/L (ref 19–32)
Calcium: 9.1 mg/dL (ref 8.4–10.5)
Chloride: 100 mEq/L (ref 96–112)
Creatinine, Ser: 0.98 mg/dL (ref 0.40–1.20)
GFR: 68.9 mL/min (ref >60.00)
Glucose, Bld: 96 mg/dL (ref 70–99)
Potassium: 3.4 mEq/L — ABNORMAL LOW (ref 3.5–5.1)
Sodium: 138 mEq/L (ref 135–145)
Total Bilirubin: 0.4 mg/dL (ref 0.2–1.2)
Total Protein: 7.5 g/dL (ref 6.0–8.3)

## 2022-06-09 LAB — HEMOGLOBIN A1C: Hgb A1c MFr Bld: 6 % (ref 4.6–6.5)

## 2022-06-09 LAB — VITAMIN D 25 HYDROXY (VIT D DEFICIENCY, FRACTURES): VITD: 53.27 ng/mL (ref 30.00–100.00)

## 2022-06-09 LAB — TSH: TSH: 1.18 u[IU]/mL (ref 0.35–5.50)

## 2022-06-09 MED ORDER — AMLODIPINE BESYLATE 10 MG PO TABS
10.0000 mg | ORAL_TABLET | Freq: Every day | ORAL | 1 refills | Status: DC
  Filled 2022-06-09: qty 90, 90d supply, fill #0

## 2022-06-09 MED ORDER — MONTELUKAST SODIUM 10 MG PO TABS
10.0000 mg | ORAL_TABLET | Freq: Every day | ORAL | 1 refills | Status: AC
  Filled 2022-06-09: qty 90, 90d supply, fill #0
  Filled 2023-05-26: qty 90, 90d supply, fill #1

## 2022-06-09 MED ORDER — LEVOCETIRIZINE DIHYDROCHLORIDE 5 MG PO TABS
5.0000 mg | ORAL_TABLET | Freq: Every evening | ORAL | 1 refills | Status: AC
  Filled 2022-06-09: qty 90, 90d supply, fill #0
  Filled 2023-05-26: qty 90, 90d supply, fill #1

## 2022-06-09 NOTE — Progress Notes (Signed)
Complete physical exam  Patient: [name]   DOB: [birth year]   46 y.o. Female  MRN: [id number]  Subjective:    Chief Complaint  Patient presents with   Annual Exam    [name] is a 46 y.o. female who presents today for a complete physical exam. She reports consuming a general diet. Exercise is limited by orthopedic condition(s): left foot issues. She generally feels well. She reports sleeping well. She does have additional problems to discuss today. Patient reports she continues to have nasal congestion and a chronic cough, states that this happens every year around this time. States that the antibiotics given at the last visit did not improve her sinus pressure/congestion.   Most recent fall risk assessment:    09/20/2020   11:57 AM  Fall Risk   Falls in the past year? 0  Number falls in past yr: 0  Injury with Fall? 0  Follow up Falls evaluation completed     Most recent depression screenings:    06/09/2022   10:26 AM 05/02/2022    3:39 PM  PHQ 2/9 Scores  PHQ - 2 Score 0 0  PHQ- 9 Score 1 2    Vision:Within last year  Patient Active Problem List   Diagnosis Date Noted   Vitamin D deficiency 03/31/2022   Chronic cough 09/27/2018   Cervical spondylosis 07/11/2018   Lymphocytic colitis 05/21/2018   Anxiety state 04/29/2016   Atypical chest pain 02/22/2016   Dysphagia, pharyngoesophageal phase 02/22/2016   Physical exam 12/15/2014   Left hip pain 09/29/2014   Radicular low back pain 09/11/2014   Bilateral hip bursitis 09/11/2014   BPV (benign positional vertigo) 02/22/2014   HTN (hypertension) 02/22/2014      Patient Care Team: Karie Georges, MD as PCP - General (Family Medicine) Marcelle Overlie, MD as Consulting Physician (Obstetrics and Gynecology)   Outpatient Medications Prior to Visit  Medication Sig   acetaminophen (TYLENOL) 500 MG tablet Take 1,000 mg by mouth every 6 (six) hours as needed.   amLODipine (NORVASC) 10 MG tablet TAKE 1  TABLET BY MOUTH DAILY.   aspirin-acetaminophen-caffeine (EXCEDRIN MIGRAINE) 250-250-65 MG tablet Take 1 tablet by mouth every 6 (six) hours as needed for headache.   chlorthalidone (HYGROTON) 25 MG tablet Take 1 tablet (25 mg total) by mouth daily.   levocetirizine (XYZAL) 5 MG tablet Take 1 tablet (5 mg total) by mouth every evening. (Patient taking differently: Take 5 mg by mouth as needed.)   levonorgestrel (MIRENA) 20 MCG/DAY IUD 1 each by Intrauterine route once.   traMADol (ULTRAM) 50 MG tablet Take 1 tablet by mouth every six to eight hours as needed for pain   Vitamin D, Ergocalciferol, (DRISDOL) 1.25 MG (50000 UNIT) CAPS capsule Take 1 capsule (50,000 Units total) by mouth every 7 (seven) days.   No facility-administered medications prior to visit.    Review of Systems  HENT:  Negative for hearing loss.   Eyes:  Negative for blurred vision.  Respiratory:  Negative for shortness of breath.   Cardiovascular:  Negative for chest pain.  Gastrointestinal: Negative.   Genitourinary: Negative.   Musculoskeletal:  Negative for back pain.  Neurological:  Negative for headaches.  Psychiatric/Behavioral:  Negative for depression.        Objective:     BP 118/84 (BP Location: Left Arm, Patient Position: Sitting, Cuff Size: Large)   Pulse 65   Temp 98.8 F (37.1 C) (Oral)   Ht 5' 3.5" (1.613  m)   Wt 185 lb (83.9 kg)   SpO2 99%   BMI 32.26 kg/m  BP Readings from Last 3 Encounters:  06/09/22 118/84  05/02/22 108/80  03/31/22 (!) 170/110      Physical Exam Vitals reviewed.  Constitutional:      Appearance: Normal appearance. She is well-groomed and normal weight.  HENT:     Head: Normocephalic and atraumatic.  Eyes:     Extraocular Movements: Extraocular movements intact.     Conjunctiva/sclera: Conjunctivae normal.  Cardiovascular:     Rate and Rhythm: Normal rate and regular rhythm.     Pulses: Normal pulses.     Heart sounds: S1 normal and S2 normal.  Pulmonary:      Effort: Pulmonary effort is normal.     Breath sounds: Normal breath sounds and air entry.  Abdominal:     General: Bowel sounds are normal.     Palpations: Abdomen is soft.  Musculoskeletal:        Right lower leg: No edema.     Left lower leg: No edema.  Neurological:     Mental Status: She is alert and oriented to person, place, and time. Mental status is at baseline.     Gait: Gait is intact.  Psychiatric:        Mood and Affect: Mood and affect normal.        Speech: Speech normal.        Behavior: Behavior normal.        Judgment: Judgment normal.    Last vitamin D Lab Results  Component Value Date   VD25OH 53.27 06/09/2022        Assessment & Plan:    Routine Health Maintenance and Physical Exam  Immunization History  Administered Date(s) Administered   Hepatitis B, adult 08/29/2015, 09/26/2015, 02/06/2016   Influenza, Seasonal, Injecte, Preservative Fre 06/30/2019   Influenza,inj,Quad PF,6+ Mos 06/01/2014, 06/07/2015, 05/18/2017, 05/28/2018   PFIZER Comirnaty(Gray Top)Covid-19 Tri-Sucrose Vaccine 09/19/2019   PFIZER(Purple Top)SARS-COV-2 Vaccination 10/07/2019   Tdap 04/05/2019    Health Maintenance  Topic Date Due   COVID-19 Vaccine (3 - Pfizer risk series) 06/25/2022 (Originally 11/04/2019)   PAP SMEAR-Modifier  08/01/2022 (Originally 02/26/2021)   INFLUENZA VACCINE  12/01/2022 (Originally 04/01/2022)   HIV Screening  06/10/2023 (Originally 04/11/1990)   COLONOSCOPY (Pts 45-85yrs Insurance coverage will need to be confirmed)  05/21/2028   TETANUS/TDAP  04/04/2029   Hepatitis C Screening  Completed   HPV VACCINES  Aged Out    Discussed health benefits of physical activity, and encouraged her to engage in regular exercise appropriate for her age and condition.  Problem List Items Addressed This Visit       Cardiovascular and Mediastinum   HTN (hypertension)    Current hypertension medications:       Sig   chlorthalidone (HYGROTON) 25 MG tablet (Taking)  Take 1 tablet (25 mg total) by mouth daily.   amLODipine (NORVASC) 10 MG tablet Take 1 tablet (10 mg total) by mouth daily.  Well controlled today, refilled her amlodipine, continue above medications as prescribed.      Relevant Medications   amLODipine (NORVASC) 10 MG tablet     Other   Chronic cough    Likely secondary to seasonal allergies, I recommended continuing the xyzal and adding singulair once daily to help reduce her symptoms. If this is not effective then we could add flonase. Pt is to call and let us know if the singulair improves her sx.  Vitamin D deficiency (Chronic)   Relevant Orders   Vitamin D, 25-hydroxy (Completed)   Other Visit Diagnoses     Routine general medical examination at a health care facility    -  Primary   Relevant Orders   Hemoglobin A1c (Completed)   Essential hypertension       Relevant Medications   amLODipine (NORVASC) 10 MG tablet   Other Relevant Orders   Lipid Panel (Completed)   CMP (Completed)   TSH (Completed)   Seasonal allergic rhinitis due to other allergic trigger       Relevant Medications   levocetirizine (XYZAL) 5 MG tablet   montelukast (SINGULAIR) 10 MG tablet      Return in 6 months (on 12/09/2022) for follow up HTN and vitamin D.     Karie Georges, MD

## 2022-06-09 NOTE — Assessment & Plan Note (Signed)
Likely secondary to seasonal allergies, I recommended continuing the xyzal and adding singulair once daily to help reduce her symptoms. If this is not effective then we could add flonase. Pt is to call and let us know if the singulair improves her sx.

## 2022-06-09 NOTE — Assessment & Plan Note (Signed)
Current hypertension medications:      Sig   chlorthalidone (HYGROTON) 25 MG tablet (Taking) Take 1 tablet (25 mg total) by mouth daily.   amLODipine (NORVASC) 10 MG tablet Take 1 tablet (10 mg total) by mouth daily.     Well controlled today, refilled her amlodipine, continue above medications as prescribed.

## 2022-06-09 NOTE — Patient Instructions (Signed)

## 2022-06-11 ENCOUNTER — Other Ambulatory Visit (HOSPITAL_COMMUNITY): Payer: Self-pay

## 2022-06-27 ENCOUNTER — Other Ambulatory Visit (HOSPITAL_COMMUNITY): Payer: Self-pay

## 2022-07-29 ENCOUNTER — Other Ambulatory Visit (HOSPITAL_COMMUNITY): Payer: Self-pay

## 2022-07-29 MED ORDER — PREDNISONE 10 MG (21) PO TBPK
ORAL_TABLET | ORAL | 0 refills | Status: AC
  Filled 2022-07-29: qty 21, 6d supply, fill #0

## 2022-07-29 MED ORDER — CELECOXIB 200 MG PO CAPS
200.0000 mg | ORAL_CAPSULE | Freq: Every day | ORAL | 3 refills | Status: DC
  Filled 2022-07-29: qty 30, 30d supply, fill #0
  Filled 2022-09-11: qty 30, 30d supply, fill #1
  Filled 2022-11-03: qty 30, 30d supply, fill #2

## 2022-08-07 ENCOUNTER — Other Ambulatory Visit (HOSPITAL_COMMUNITY): Payer: Self-pay

## 2022-09-02 ENCOUNTER — Other Ambulatory Visit (HOSPITAL_COMMUNITY): Payer: Self-pay

## 2022-09-05 ENCOUNTER — Encounter: Payer: Self-pay | Admitting: Family Medicine

## 2022-09-05 NOTE — Telephone Encounter (Signed)
Can we set up a video visit for her next week?

## 2022-09-11 ENCOUNTER — Other Ambulatory Visit (HOSPITAL_COMMUNITY): Payer: Self-pay

## 2022-10-01 ENCOUNTER — Other Ambulatory Visit: Payer: Self-pay

## 2022-10-02 ENCOUNTER — Other Ambulatory Visit (HOSPITAL_COMMUNITY): Payer: Self-pay

## 2022-10-02 ENCOUNTER — Encounter (HOSPITAL_COMMUNITY): Payer: Self-pay

## 2022-10-06 ENCOUNTER — Other Ambulatory Visit: Payer: Self-pay

## 2022-11-03 ENCOUNTER — Other Ambulatory Visit (HOSPITAL_COMMUNITY): Payer: Self-pay

## 2022-12-04 DIAGNOSIS — M25552 Pain in left hip: Secondary | ICD-10-CM | POA: Diagnosis not present

## 2022-12-04 DIAGNOSIS — M545 Low back pain, unspecified: Secondary | ICD-10-CM | POA: Diagnosis not present

## 2022-12-08 ENCOUNTER — Ambulatory Visit (INDEPENDENT_AMBULATORY_CARE_PROVIDER_SITE_OTHER): Payer: 59 | Admitting: Family Medicine

## 2022-12-08 ENCOUNTER — Encounter: Payer: Self-pay | Admitting: Family Medicine

## 2022-12-08 ENCOUNTER — Other Ambulatory Visit (HOSPITAL_COMMUNITY): Payer: Self-pay

## 2022-12-08 VITALS — BP 118/88 | HR 75 | Temp 98.7°F | Ht 63.5 in | Wt 182.6 lb

## 2022-12-08 DIAGNOSIS — R7303 Prediabetes: Secondary | ICD-10-CM

## 2022-12-08 DIAGNOSIS — I10 Essential (primary) hypertension: Secondary | ICD-10-CM | POA: Diagnosis not present

## 2022-12-08 DIAGNOSIS — E559 Vitamin D deficiency, unspecified: Secondary | ICD-10-CM

## 2022-12-08 LAB — POCT GLYCOSYLATED HEMOGLOBIN (HGB A1C): Hemoglobin A1C: 5.6 % (ref 4.0–5.6)

## 2022-12-08 MED ORDER — CHLORTHALIDONE 25 MG PO TABS
25.0000 mg | ORAL_TABLET | Freq: Every day | ORAL | 1 refills | Status: DC
Start: 2022-12-08 — End: 2023-10-21
  Filled 2022-12-08: qty 90, 90d supply, fill #0
  Filled 2023-03-08: qty 90, 90d supply, fill #1

## 2022-12-08 MED ORDER — AMLODIPINE BESYLATE 10 MG PO TABS
10.0000 mg | ORAL_TABLET | Freq: Every day | ORAL | 1 refills | Status: DC
  Filled 2022-12-08: qty 90, 90d supply, fill #0
  Filled 2023-03-08: qty 90, 90d supply, fill #1

## 2022-12-08 NOTE — Assessment & Plan Note (Signed)
Current hypertension medications:       Sig   amLODipine (NORVASC) 10 MG tablet Take 1 tablet (10 mg total) by mouth daily.   chlorthalidone (HYGROTON) 25 MG tablet Take 1 tablet (25 mg total) by mouth daily.      BP is well controlled today. I refilled her BP medications listed above.

## 2022-12-08 NOTE — Assessment & Plan Note (Signed)
Reviewed last level, it was back to normal. She has already stopped the 50,000 unit supplements once weekly I recommended she continue a daily smaller supplement, at least 800 units daily. Will recheck at her next visit.

## 2022-12-08 NOTE — Progress Notes (Signed)
Established Patient Office Visit  Subjective   Patient ID: [name], female    DOB: Jan 31, 1976  Age: 47 y.o. MRN: [id number]  Chief Complaint  Patient presents with   Medical Management of Chronic Issues    Pt is here for follow up.  HTN -- BP in office performed and is well controlled. She  reports no side effects to the medications, no chest pain, SOB, dizziness or headaches. She has a BP cuff at home and is checking BP regularly, reports they are in the normal range.    Prediabetes-- last A1C was 6.0. pt reports she has not changed much or her diet or exercise patterns, see below.   Pt is reporting a new symptom of acute low back pain and leg pain. She reports that her mother in law is chronically ill, has been having to lift her and bath her. States that her MIL lives with them and they are the primary caregivers for her. Pt states she went to urgent care and they gave her robaxin for muscle spasms. Is using her celecoxib 200 mg daily at home. No other new symptoms or issues to report.    Current Outpatient Medications  Medication Instructions   acetaminophen (TYLENOL) 1,000 mg, Oral, Every 6 hours PRN   amLODipine (NORVASC) 10 mg, Oral, Daily   aspirin-acetaminophen-caffeine (EXCEDRIN MIGRAINE) 250-250-65 MG tablet 1 tablet, Oral, Every 6 hours PRN   celecoxib (CELEBREX) 200 MG capsule Take 1 capsule (200 mg total) by mouth daily with food for pain and swelling   chlorthalidone (HYGROTON) 25 mg, Oral, Daily   levocetirizine (XYZAL) 5 mg, Oral, Every evening   levonorgestrel (MIRENA) 20 MCG/DAY IUD 1 each, Intrauterine,  Once   montelukast (SINGULAIR) 10 mg, Oral, Daily at bedtime   traMADol (ULTRAM) 50 MG tablet Take 1 tablet by mouth every six to eight hours as needed for pain    Patient Active Problem List   Diagnosis Date Noted   Vitamin D deficiency 03/31/2022   Chronic cough 09/27/2018   Cervical spondylosis 07/11/2018   Lymphocytic colitis 05/21/2018    Anxiety state 04/29/2016   Atypical chest pain 02/22/2016   Dysphagia, pharyngoesophageal phase 02/22/2016   Physical exam 12/15/2014   Left hip pain 09/29/2014   Radicular low back pain 09/11/2014   Bilateral hip bursitis 09/11/2014   BPV (benign positional vertigo) 02/22/2014   HTN (hypertension) 02/22/2014      Review of Systems  All other systems reviewed and are negative.     Objective:     BP 118/88 (BP Location: Left Arm, Patient Position: Sitting, Cuff Size: Large)   Pulse 75   Temp 98.7 F (37.1 C) (Oral)   Ht 5' 3.5" (1.613 m)   Wt 182 lb 9.6 oz (82.8 kg)   SpO2 98%   BMI 31.84 kg/m    Physical Exam Vitals reviewed.  Constitutional:      Appearance: Normal appearance. She is well-groomed and normal weight.  Eyes:     Conjunctiva/sclera: Conjunctivae normal.  Neck:     Thyroid: No thyromegaly.  Cardiovascular:     Rate and Rhythm: Normal rate and regular rhythm.     Heart sounds: S1 normal and S2 normal.  Pulmonary:     Effort: Pulmonary effort is normal.     Breath sounds: Normal breath sounds and air entry.  Musculoskeletal:     Right lower leg: No edema.     Left lower leg: No edema.  Neurological:  Mental Status: She is alert and oriented to person, place, and time. Mental status is at baseline.     Gait: Gait is intact.  Psychiatric:        Mood and Affect: Mood and affect normal.        Speech: Speech normal.        Behavior: Behavior normal.        Judgment: Judgment normal.      Results for orders placed or performed in visit on 12/08/22  POC HgB A1c  Result Value Ref Range   Hemoglobin A1C 5.6 4.0 - 5.6 %   HbA1c POC (<> result, manual entry)     HbA1c, POC (prediabetic range)     HbA1c, POC (controlled diabetic range)      Last metabolic panel Lab Results  Component Value Date   GLUCOSE 96 06/09/2022   NA 138 06/09/2022   K 3.4 (L) 06/09/2022   CL 100 06/09/2022   CO2 31 06/09/2022   BUN 12 06/09/2022   CREATININE  0.98 06/09/2022   EGFR 70 06/11/2021   CALCIUM 9.1 06/09/2022   PROT 7.5 06/09/2022   ALBUMIN 4.2 06/09/2022   LABGLOB 2.8 04/29/2021   AGRATIO 1.6 04/29/2021   BILITOT 0.4 06/09/2022   ALKPHOS 61 06/09/2022   AST 14 06/09/2022   ALT 9 06/09/2022   ANIONGAP 6 09/11/2020   Last hemoglobin A1c Lab Results  Component Value Date   HGBA1C 5.6 12/08/2022      The 10-year ASCVD risk score (Arnett DK, et al., 2019) is: 2.4%    Assessment & Plan:   Problem List Items Addressed This Visit       Unprioritized   HTN (hypertension) - Primary   Relevant Medications   amLODipine (NORVASC) 10 MG tablet   chlorthalidone (HYGROTON) 25 MG tablet   Other Visit Diagnoses     Prediabetes       Relevant Orders   A1C was elevated on her labs in 06/2022 indicating prediabetes.  A1C is back down to 5.6, will continue to monitor this yearly. Continues current diet and exercise plan.  POC HgB A1c (Completed)   Vitamin D deficiency         Reviewed last level, it was back to normal. She has already stopped the 50,000 unit supplements once weekly I recommended she continue a daily smaller supplement, at least 800 units daily. Will recheck at her next visit.        Reviewed HM, she will be due for her pap smear this year, will get this done for her at her next visit in 6 months.   Return in about 6 months (around 06/09/2023) for annual physical with pap smear.    Karie Georges, MD

## 2022-12-15 ENCOUNTER — Encounter: Payer: Self-pay | Admitting: Family Medicine

## 2022-12-15 DIAGNOSIS — M5410 Radiculopathy, site unspecified: Secondary | ICD-10-CM

## 2022-12-16 ENCOUNTER — Other Ambulatory Visit (HOSPITAL_COMMUNITY): Payer: Self-pay

## 2022-12-16 ENCOUNTER — Other Ambulatory Visit: Payer: Self-pay

## 2022-12-16 MED ORDER — METHOCARBAMOL 500 MG PO TABS
500.0000 mg | ORAL_TABLET | Freq: Three times a day (TID) | ORAL | 0 refills | Status: DC | PRN
Start: 2022-12-16 — End: 2023-07-29
  Filled 2022-12-16: qty 60, 20d supply, fill #0

## 2022-12-16 MED ORDER — METHYLPREDNISOLONE 4 MG PO TBPK
ORAL_TABLET | ORAL | 0 refills | Status: DC
Start: 2022-12-16 — End: 2023-01-21
  Filled 2022-12-16: qty 21, 6d supply, fill #0

## 2023-01-13 NOTE — Addendum Note (Signed)
Addended by: Karie Georges on: 01/13/2023 04:04 PM   Modules accepted: Orders

## 2023-01-14 ENCOUNTER — Other Ambulatory Visit: Payer: Self-pay

## 2023-01-14 ENCOUNTER — Other Ambulatory Visit (HOSPITAL_COMMUNITY): Payer: Self-pay

## 2023-01-14 ENCOUNTER — Encounter: Payer: Self-pay | Admitting: Pharmacist

## 2023-01-14 MED ORDER — CELECOXIB 200 MG PO CAPS
200.0000 mg | ORAL_CAPSULE | Freq: Every day | ORAL | 5 refills | Status: DC
Start: 2023-01-14 — End: 2023-05-26
  Filled 2023-01-14: qty 30, 30d supply, fill #0
  Filled 2023-03-08: qty 30, 30d supply, fill #1
  Filled 2023-04-22: qty 30, 30d supply, fill #2
  Filled 2023-05-26: qty 30, 30d supply, fill #3

## 2023-01-14 NOTE — Addendum Note (Signed)
Addended by: Karie Georges on: 01/14/2023 08:10 AM   Modules accepted: Orders

## 2023-01-21 ENCOUNTER — Ambulatory Visit: Payer: 59 | Admitting: Family Medicine

## 2023-01-21 ENCOUNTER — Ambulatory Visit (INDEPENDENT_AMBULATORY_CARE_PROVIDER_SITE_OTHER): Payer: 59

## 2023-01-21 VITALS — BP 152/104 | HR 70 | Ht 63.5 in | Wt 183.0 lb

## 2023-01-21 DIAGNOSIS — M5410 Radiculopathy, site unspecified: Secondary | ICD-10-CM

## 2023-01-21 DIAGNOSIS — M545 Low back pain, unspecified: Secondary | ICD-10-CM | POA: Diagnosis not present

## 2023-01-21 MED ORDER — GABAPENTIN 300 MG PO CAPS
300.0000 mg | ORAL_CAPSULE | Freq: Three times a day (TID) | ORAL | 3 refills | Status: DC | PRN
  Filled 2023-01-21: qty 90, 30d supply, fill #0
  Filled 2023-03-21: qty 90, 30d supply, fill #1
  Filled 2023-05-26: qty 90, 30d supply, fill #2
  Filled 2023-08-05: qty 90, 30d supply, fill #3

## 2023-01-21 MED ORDER — PREDNISONE 50 MG PO TABS
50.0000 mg | ORAL_TABLET | Freq: Every day | ORAL | 0 refills | Status: DC
  Filled 2023-01-21: qty 5, 5d supply, fill #0

## 2023-01-21 NOTE — Patient Instructions (Addendum)
Thank you for coming in today.   I've referred you to Physical Therapy.  Let us know if you don't hear from them in one week.   Please get an Xray today before you leave   Use gabapentin as needed mostly at bedtime.   Recheck in 6 weeks.   Let me know sooner if this is not working.

## 2023-01-21 NOTE — Progress Notes (Unsigned)
Rubin Payor, PhD, LAT, ATC acting as a scribe for Clementeen Graham, MD.  [name] is a 47 y.o. female who presents to [facility] at [facility] today for low back pain x 2 months. She provides to care at home to her mother-in-law. 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  Pt locates pain to the L-side of her low back, L buttock, and into the L posterior thigh.    Radiating pain: yes LE numbness/tingling: no LE weakness: no- feels "heavy" Aggravates: activity, bending over, transitioning to stand Treatments tried: robaxin, celebrex, tylenol, heating pad w/ massager, prednisone  Pertinent review of systems: No fevers or chills  Relevant historical information: Hypertension.  History of BPPV.  History of cervical radiculopathy.   Exam:  BP (!) 152/104   Pulse 70   Ht 5' 3.5" (1.613 m)   Wt 183 lb (83 kg)   SpO2 98%   BMI 31.91 kg/m  General: Well Developed, well nourished, and in no acute distress.   MSK: L-spine: Normal appearing Nontender palpation spinal midline. Normal lumbar motion. Lower extremity strength is intact. Reflexes are intact.  Left hip: Normal-appearing Nontender palpation normal hip motion.  Intact strength.    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  No acute fractures are visible. Await formal radiology review     Assessment and Plan: 47 y.o. female with left leg pain thought to be lumbar radiculopathy at L5 or S1 or both.  She could have some overlap with possible piriformis syndrome or hip abductor tendinopathy contributing to her pain.  She is a good candidate for conservative management trial beyond watchful waiting and NSAIDs that she is already done.  Will use prednisone and gabapentin.  PT.  Reassess in 6 weeks.  If not improving consider MRI.   PDMP not reviewed this encounter. Orders Placed  This Encounter  Procedures   DG Lumbar Spine 2-3 Views    Standing Status:   Future    Number of Occurrences:   1    Standing Expiration Date:   02/21/2023    Order Specific Question:   Reason for Exam (SYMPTOM  OR DIAGNOSIS REQUIRED)    Answer:   low back pain    Order Specific Question:   Preferred imaging location?    Answer:   [facility]    Order Specific Question:   Is patient pregnant?    Answer:   No   Ambulatory referral to Physical Therapy    Referral Priority:   Routine    Referral Type:   Physical Medicine    Referral Reason:   Specialty Services Required    Requested Specialty:   Physical Therapy    Number of Visits Requested:   1   Meds ordered this encounter  Medications   gabapentin (NEURONTIN) 300 MG capsule    Sig: Take 1 capsule (300 mg total) by mouth 3 (three) times daily as needed.    Dispense:  90 capsule    Refill:  3   predniSONE (DELTASONE) 50 MG tablet    Sig: Take 1 tablet (50 mg total) by mouth daily.    Dispense:  5 tablet    Refill:  0     Discussed warning signs or symptoms. Please see discharge instructions. Patient expresses understanding.   The above documentation has been reviewed and is accurate and complete Clayburn Pert  Denyse Amass, M.D.

## 2023-01-22 ENCOUNTER — Other Ambulatory Visit (HOSPITAL_COMMUNITY): Payer: Self-pay

## 2023-01-27 NOTE — Progress Notes (Signed)
Lumbar spine x-ray shows arthritis changes in the low back.  No fractures are visible.

## 2023-03-04 ENCOUNTER — Other Ambulatory Visit: Payer: Self-pay

## 2023-03-04 ENCOUNTER — Other Ambulatory Visit (HOSPITAL_COMMUNITY): Payer: Self-pay

## 2023-03-04 ENCOUNTER — Ambulatory Visit (INDEPENDENT_AMBULATORY_CARE_PROVIDER_SITE_OTHER): Payer: 59

## 2023-03-04 ENCOUNTER — Ambulatory Visit: Payer: 59 | Admitting: Family Medicine

## 2023-03-04 VITALS — BP 128/82 | HR 76 | Ht 63.5 in | Wt 182.0 lb

## 2023-03-04 DIAGNOSIS — M25552 Pain in left hip: Secondary | ICD-10-CM | POA: Diagnosis not present

## 2023-03-04 DIAGNOSIS — G8929 Other chronic pain: Secondary | ICD-10-CM

## 2023-03-04 DIAGNOSIS — M5410 Radiculopathy, site unspecified: Secondary | ICD-10-CM | POA: Diagnosis not present

## 2023-03-04 DIAGNOSIS — M1612 Unilateral primary osteoarthritis, left hip: Secondary | ICD-10-CM | POA: Diagnosis not present

## 2023-03-04 MED ORDER — HYDROCODONE-ACETAMINOPHEN 5-325 MG PO TABS
1.0000 | ORAL_TABLET | Freq: Four times a day (QID) | ORAL | 0 refills | Status: DC | PRN
  Filled 2023-03-04 (×2): qty 15, 4d supply, fill #0

## 2023-03-04 NOTE — Progress Notes (Signed)
Rubin Payor, PhD, LAT, ATC acting as a scribe for Clementeen Graham, MD.  [name] is a 47 y.o. female who presents to [facility] at [facility] today for 6-wk f/u radicular low back pain. She provides to care at home to her mother-in-law. 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 Pt was last seen by Dr. Denyse Amass on 01/21/23 and was prescribed prednisone, gabapentin, and was referred to PT, but she canceled her first visit scheduled for July 8th.  Today, pt reports she did some HEP on her own that she Googled and looked up on YouTube. LBP is the same. Pt locates pain to the L-side of her low back, down into her L buttock, wrapping around to the anterior hip/groin and distally along the posterior thigh.   Pain anterior hip and posterior leg.  Dx imaging: 01/21/23 L-spine XR  Pertinent review of systems: No fevers or chills  Relevant historical information: Hypertension   Exam:  BP 128/82   Pulse 76   Ht 5' 3.5" (1.613 m)   Wt 182 lb (82.6 kg)   SpO2 97%   BMI 31.73 kg/m  General: Well Developed, well nourished, and in no acute distress.   MSK: Lumbar spine normal appearing Mildly tender palpation left lumbar paraspinal musculature.  Nontender SI joint.  Left hip normal-appearing minimally tender posterior hip.  Not particularly tender greater trochanter. Hip range of motion is limited pain with flexion and internal rotation is present.    Lab and Radiology Results  Procedure: Real-time Ultrasound Guided Injection of left hip intra-articular injection Device: Philips Affiniti 50G/GE Logiq Images permanently stored and available for review in PACS Verbal informed consent obtained.  Discussed risks and benefits of procedure. Warned about infection, bleeding, hyperglycemia damage to structures among others. Patient expresses understanding and agreement Time-out conducted.   Noted no overlying erythema, induration, or other signs of local  infection.   Skin prepped in a sterile fashion.   Local anesthesia: Topical Ethyl chloride.   With sterile technique and under real time ultrasound guidance: 40 mg of Kenalog and 2 ml Marcaine injected into hip joint. Fluid seen entering the joint capsule.   Completed without difficulty   Pain moderately resolved suggesting accurate placement of the medication.   Advised to call if fevers/chills, erythema, induration, drainage, or persistent bleeding.   Images permanently stored and available for review in the ultrasound unit.  Impression: Technically successful ultrasound guided injection.    X-ray images left hip obtained today personally and independently interpreted No acute fractures.  No severe DJD. Await formal radiology review    Assessment and Plan: 47 y.o. female with left hip and leg pain.  Symptoms difficult to understand and likely multifactorial.  I think a lot of her pain is lumbar radiculopathy.  She has some anterior hip pain which could be anterior hip etiology.  We tried a diagnostic and therapeutic hip injection today which provided moderate benefit especially the anterior hip pain.  If today's injection does not provide significant relief over the weekend would recommend she contact me and we can order an MRI lumbar spine. Hydrocodone prescribed for emergency pain control if needed over the long weekend.  PDMP reviewed during this encounter. Orders Placed This Encounter  Procedures   [place] LIMITED JOINT SPACE STRUCTURES LOW LEFT(NO LINKED CHARGES)    Order Specific Question:   Reason for Exam (SYMPTOM  OR DIAGNOSIS REQUIRED)    Answer:   left hip pain  Order Specific Question:   Preferred imaging location?    Answer:   [facility] HIP UNILAT W OR W/O PELVIS 2-3 VIEWS LEFT    Standing Status:   Future    Number of Occurrences:   1    Standing Expiration Date:   04/04/2023    Order Specific Question:   Reason for Exam (SYMPTOM  OR  DIAGNOSIS REQUIRED)    Answer:   left hip pain    Order Specific Question:   Preferred imaging location?    Answer:   [facility]    Order Specific Question:   Is patient pregnant?    Answer:   No   Meds ordered this encounter  Medications   HYDROcodone-acetaminophen (NORCO/VICODIN) 5-325 MG tablet    Sig: Take 1 tablet by mouth every 6 (six) hours as needed.    Dispense:  15 tablet    Refill:  0     Discussed warning signs or symptoms. Please see discharge instructions. Patient expresses understanding.   The above documentation has been reviewed and is accurate and complete Clementeen Graham, M.D.

## 2023-03-04 NOTE — Patient Instructions (Addendum)
Thank you for coming in today.   Please get an Xray today before you leave   You received an injection today. Seek immediate medical attention if the joint becomes red, extremely painful, or is oozing fluid.   Let me know next week how you are doing.   If not better, we may proceed to a MRI.

## 2023-03-09 ENCOUNTER — Other Ambulatory Visit: Payer: Self-pay

## 2023-03-09 ENCOUNTER — Ambulatory Visit: Payer: 59 | Admitting: Physical Therapy

## 2023-03-11 NOTE — Progress Notes (Signed)
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  This is rated as mild.  Mild arthritis can be worse than it seems on x-ray.

## 2023-03-21 ENCOUNTER — Other Ambulatory Visit (HOSPITAL_COMMUNITY): Payer: Self-pay

## 2023-04-22 ENCOUNTER — Other Ambulatory Visit (HOSPITAL_COMMUNITY): Payer: Self-pay

## 2023-05-26 ENCOUNTER — Encounter: Payer: Self-pay | Admitting: Family Medicine

## 2023-05-26 ENCOUNTER — Other Ambulatory Visit: Payer: Self-pay | Admitting: Family Medicine

## 2023-05-26 ENCOUNTER — Ambulatory Visit: Payer: 59 | Admitting: Family Medicine

## 2023-05-26 ENCOUNTER — Other Ambulatory Visit: Payer: Self-pay

## 2023-05-26 ENCOUNTER — Other Ambulatory Visit (HOSPITAL_COMMUNITY): Payer: Self-pay

## 2023-05-26 VITALS — BP 122/86 | HR 64 | Ht 63.5 in | Wt 178.0 lb

## 2023-05-26 DIAGNOSIS — M5410 Radiculopathy, site unspecified: Secondary | ICD-10-CM | POA: Diagnosis not present

## 2023-05-26 DIAGNOSIS — M25552 Pain in left hip: Secondary | ICD-10-CM

## 2023-05-26 DIAGNOSIS — G8929 Other chronic pain: Secondary | ICD-10-CM | POA: Diagnosis not present

## 2023-05-26 MED ORDER — HYDROCODONE-ACETAMINOPHEN 5-325 MG PO TABS
1.0000 | ORAL_TABLET | Freq: Four times a day (QID) | ORAL | 0 refills | Status: DC | PRN
  Filled 2023-05-26: qty 15, 4d supply, fill #0

## 2023-05-26 MED ORDER — CELECOXIB 200 MG PO CAPS
200.0000 mg | ORAL_CAPSULE | Freq: Two times a day (BID) | ORAL | 5 refills | Status: AC | PRN
Start: 2023-05-26 — End: ?
  Filled 2023-05-26 – 2023-06-29 (×2): qty 60, 30d supply, fill #0
  Filled 2023-08-20: qty 60, 30d supply, fill #1
  Filled 2023-12-21: qty 60, 30d supply, fill #2
  Filled 2024-02-02: qty 60, 30d supply, fill #3
  Filled 2024-03-16: qty 60, 30d supply, fill #4
  Filled 2024-04-19: qty 60, 30d supply, fill #5

## 2023-05-26 NOTE — Telephone Encounter (Signed)
Pt has visit scheduled for today at 2:45 PM.

## 2023-05-26 NOTE — Progress Notes (Unsigned)
I, Stevenson Clinch, CMA acting as a scribe for Clementeen Graham, MD.  [name] is a 47 y.o. female who presents to [facility] at [facility] today for re-eval of lower back and chronic left hip pain. Pt was last seen by Dr. Denyse Amass on 03/04/23 for chronic left hip and lower back pain. Pt received intra-articular left hip injection and was prescribed Hydrocodone-APAP. Consider MRI if not improvement.   Today patient reports some improvement of sx after steroid injection. Was able to start back walking again. Sx started to return over the past 2 weeks, slightly worse than before. Continues to have left-sided lower back pain radiating into the lateral hip and posterior leg. Sx exacerbated with movement. Intermittent aching with sitting. Also have pain with bending.    Dx imaging: 01/21/23 L-spine XR   Pertinent review of systems: No fevers or chills  Relevant historical information: Hypertension   Exam:  BP 122/86   Pulse 64   Ht 5' 3.5" (1.613 m)   Wt 178 lb (80.7 kg)   SpO2 99%   BMI 31.04 kg/m  General: Well Developed, well nourished, and in no acute distress.   MSK: L-spine: Normal appearing. Nontender palpation spinal midline. Decreased lumbar motion. Lower extremity strength is intact.  Left hip: Normal-appearing Pain present in anterior and posterior hip with flexion and internal rotation. Strength is intact.    Lab and Radiology Results  EXAM: DG HIP (WITH OR WITHOUT PELVIS) 2-3V LEFT   COMPARISON:  None Available.   FINDINGS: No acute fracture or dislocation. Mild osteophyte formation of the LEFT hip. No area of erosion or osseous destruction. No unexpected radiopaque foreign body. IUD.   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     Electronically Signed   By: Meda Klinefelter M.D.   On: 03/11/2023 11:00 EXAM: LUMBAR SPINE - 2-3 VIEW   COMPARISON:  Lumbar MRI 10/11/2014   FINDINGS: There are 5 non-rib-bearing lumbar  vertebra. Lumbar alignment is normal. Vertebral body heights are normal. No evidence of fracture or compression deformity. The disc spaces are preserved. There is facet hypertrophy at L3-L4. Facet hypertrophy at L4-L5 and L5-S1 was better demonstrated on prior MRI. No pars defects or focal bone abnormalities. IUD in the pelvis.   IMPRESSION: Facet hypertrophy at L3-L4. Facet hypertrophy at L4-L5 and L5-S1 was better demonstrated on prior MRI.     Electronically Signed   By: Narda Rutherford M.D.   On: 01/26/2023 08:58 I, Clementeen Graham, personally (independently) visualized and performed the interpretation of the images attached in this note.    Assessment and Plan: 47 y.o. female with multifactorial back hip and leg pain.  I think she may have a component of intrinsic hip pathology such as a labrum tear or avascular necrosis and likely does have some lumbar radicular component or perhaps referred pain from facet arthritis.  About just about 3 months ago she had a good response to intra-articular left hip injection lasting until recently.  However the location of her pain is not clear or obvious. She has failed to improve with good physician directed conservative management including injection and home exercise program. Plan for MRI lumbar spine and MRI arthrogram left hip.  Recheck will get the results back.  Could refer directly to orthopedic surgery if her hip pathology supports surgical consultation.  We talked about pain management.  Recommend maximizing Tylenol and Celebrex.  PDMP not reviewed this encounter. Orders Placed This Encounter  Procedures  MR LUMBAR SPINE WO CONTRAST    Standing Status:   Future    Standing Expiration Date:   06/25/2023    Order Specific Question:   What is the patient's sedation requirement?    Answer:   No Sedation    Order Specific Question:   Does the patient have a pacemaker or implanted devices?    Answer:   No    Order Specific Question:    Preferred imaging location?    Answer:   [facility] (table limit-350lbs)   MR HIP LEFT W CONTRAST    Standing Status:   Future    Standing Expiration Date:   05/25/2024    Scheduling Instructions:     Schedule with Dr T 1 hour prior to MRI for arthrogram injection. NO IV contrast.    Order Specific Question:   Reason for Exam (SYMPTOM  OR DIAGNOSIS REQUIRED)    Answer:   eval left hip pain suspect labrum    Order Specific Question:   If indicated for the ordered procedure, I authorize the administration of contrast media per Radiology protocol    Answer:   Yes    Order Specific Question:   What is the patient's sedation requirement?    Answer:   No Sedation    Order Specific Question:   Does the patient have a pacemaker or implanted devices?    Answer:   No    Order Specific Question:   Preferred imaging location?    Answer:   [facility] (table limit-350lbs)   Meds ordered this encounter  Medications   celecoxib (CELEBREX) 200 MG capsule    Sig: Take 1 capsule (200 mg total) by mouth 2 (two) times daily as needed.    Dispense:  60 capsule    Refill:  5     Discussed warning signs or symptoms. Please see discharge instructions. Patient expresses understanding.   The above documentation has been reviewed and is accurate and complete Clementeen Graham, M.D.

## 2023-05-26 NOTE — Patient Instructions (Addendum)
Thank you for coming in today.   You should hear from MRI scheduling within 1 week. If you do not hear please let me know.    Ok to increase celebrex to 200mg  twice daily for a few weeks.   OK to use tylenol arthritis or extended release 2 pills every 8 hours.    Once you get the results from the MRI we may do a recheck or I may order a back injection or even a consultation with a surgeon.

## 2023-05-27 ENCOUNTER — Other Ambulatory Visit: Payer: Self-pay

## 2023-05-28 ENCOUNTER — Other Ambulatory Visit: Payer: Self-pay

## 2023-05-28 ENCOUNTER — Telehealth: Payer: Self-pay | Admitting: Family Medicine

## 2023-05-28 MED ORDER — LORAZEPAM 0.5 MG PO TABS
0.5000 mg | ORAL_TABLET | ORAL | 0 refills | Status: DC
  Filled 2023-05-28: qty 4, 2d supply, fill #0

## 2023-05-28 NOTE — Telephone Encounter (Signed)
Patient requested anxiety medication for MRI.  Ativan prescribed at [facility].

## 2023-06-09 ENCOUNTER — Other Ambulatory Visit: Payer: 59

## 2023-06-09 ENCOUNTER — Ambulatory Visit: Payer: 59 | Admitting: Sports Medicine

## 2023-06-10 ENCOUNTER — Ambulatory Visit: Payer: 59 | Admitting: Family Medicine

## 2023-06-17 ENCOUNTER — Other Ambulatory Visit: Payer: Self-pay

## 2023-06-17 ENCOUNTER — Encounter: Payer: Self-pay | Admitting: Family Medicine

## 2023-06-17 ENCOUNTER — Ambulatory Visit: Payer: 59 | Admitting: Family Medicine

## 2023-06-17 VITALS — BP 140/92 | HR 85 | Temp 98.8°F | Ht 63.5 in | Wt 179.2 lb

## 2023-06-17 DIAGNOSIS — R311 Benign essential microscopic hematuria: Secondary | ICD-10-CM | POA: Diagnosis not present

## 2023-06-17 DIAGNOSIS — Z0000 Encounter for general adult medical examination without abnormal findings: Secondary | ICD-10-CM

## 2023-06-17 DIAGNOSIS — R339 Retention of urine, unspecified: Secondary | ICD-10-CM

## 2023-06-17 DIAGNOSIS — R7303 Prediabetes: Secondary | ICD-10-CM

## 2023-06-17 DIAGNOSIS — E559 Vitamin D deficiency, unspecified: Secondary | ICD-10-CM

## 2023-06-17 DIAGNOSIS — Z1231 Encounter for screening mammogram for malignant neoplasm of breast: Secondary | ICD-10-CM

## 2023-06-17 DIAGNOSIS — I10 Essential (primary) hypertension: Secondary | ICD-10-CM | POA: Diagnosis not present

## 2023-06-17 DIAGNOSIS — Z13220 Encounter for screening for lipoid disorders: Secondary | ICD-10-CM

## 2023-06-17 DIAGNOSIS — Z0001 Encounter for general adult medical examination with abnormal findings: Secondary | ICD-10-CM

## 2023-06-17 LAB — LIPID PANEL
Cholesterol: 185 mg/dL (ref 0–200)
HDL: 48.6 mg/dL (ref >39.00)
LDL Cholesterol: 126 mg/dL — ABNORMAL HIGH (ref 0–99)
NonHDL: 136.76
Total CHOL/HDL Ratio: 4
Triglycerides: 52 mg/dL (ref 0.0–149.0)
VLDL: 10.4 mg/dL (ref 0.0–40.0)

## 2023-06-17 LAB — COMPREHENSIVE METABOLIC PANEL
ALT: 12 U/L (ref 0–35)
AST: 13 U/L (ref 0–37)
Albumin: 4.5 g/dL (ref 3.5–5.2)
Alkaline Phosphatase: 55 U/L (ref 39–117)
BUN: 14 mg/dL (ref 6–23)
CO2: 32 meq/L (ref 19–32)
Calcium: 9.7 mg/dL (ref 8.4–10.5)
Chloride: 99 meq/L (ref 96–112)
Creatinine, Ser: 0.91 mg/dL (ref 0.40–1.20)
GFR: 74.77 mL/min (ref >60.00)
Glucose, Bld: 97 mg/dL (ref 70–99)
Potassium: 3.7 meq/L (ref 3.5–5.1)
Sodium: 137 meq/L (ref 135–145)
Total Bilirubin: 0.4 mg/dL (ref 0.2–1.2)
Total Protein: 7.9 g/dL (ref 6.0–8.3)

## 2023-06-17 LAB — POC URINALSYSI DIPSTICK (AUTOMATED)
Bilirubin, UA: NEGATIVE
Glucose, UA: NEGATIVE
Ketones, UA: NEGATIVE
Leukocytes, UA: NEGATIVE
Nitrite, UA: NEGATIVE
Protein, UA: NEGATIVE
Spec Grav, UA: 1.02 (ref 1.010–1.025)
Urobilinogen, UA: 0.2 U/dL
pH, UA: 6 (ref 5.0–8.0)

## 2023-06-17 LAB — HEMOGLOBIN A1C: Hgb A1c MFr Bld: 5.9 % (ref 4.6–6.5)

## 2023-06-17 LAB — VITAMIN D 25 HYDROXY (VIT D DEFICIENCY, FRACTURES): VITD: 22.89 ng/mL — ABNORMAL LOW (ref 30.00–100.00)

## 2023-06-17 LAB — TSH: TSH: 1.07 u[IU]/mL (ref 0.35–5.50)

## 2023-06-17 MED ORDER — OLMESARTAN MEDOXOMIL 20 MG PO TABS
10.0000 mg | ORAL_TABLET | Freq: Every day | ORAL | 1 refills | Status: DC
  Filled 2023-06-17: qty 45, 90d supply, fill #0
  Filled 2023-09-08: qty 45, 90d supply, fill #1
  Filled 2023-10-21 – 2023-12-10 (×2): qty 45, 90d supply, fill #2

## 2023-06-17 NOTE — Progress Notes (Signed)
Complete physical exam  Patient: [name]   DOB: 11/16/1975   47 y.o. Female  MRN: [id number]  Subjective:    Chief Complaint  Patient presents with   Medical Management of Chronic Issues   Dysuria    Patient complains of pain with urination, sensation of bladder not being emptied and "trickle of urine" noted x1 month    [name] is a 47 y.o. female who presents today for a complete physical exam. She reports consuming a general diet. Home exercise routine includes walking 2 hrs per week. She generally feels well. She reports sleeping well. She does have additional problems to discuss today.   Bladder symptoms: Pt is reporting a several week history of incomplete bladder emptying and intermittent increased frequency. States that it comes and goes, no fever or chills, no burning with urination, states that she has IBS and so has chronic diarrhea rather than constipation. States that lately she feels like she has had to "push" out the urine" and that after urinating she has to go back to the BR to urinate more. No blood seen in urine, no changes in the color or her urine, no flank pain.   Blood pressure-- pt reports that lately her blood pressures have increased as well, states at home her BP has been around 140/100, she is taking her medications as prescribed, denies any chest pain or palpitations, has strong family history of stroke/ HTN.   Most recent fall risk assessment:    09/20/2020   11:57 AM  Fall Risk   Falls in the past year? 0  Number falls in past yr: 0  Injury with Fall? 0  Follow up Falls evaluation completed     Most recent depression screenings:    06/17/2023   10:42 AM 12/08/2022   11:00 AM  PHQ 2/9 Scores  PHQ - 2 Score 0 3  PHQ- 9 Score 0 7    Vision:Within last year and Dental: No current dental problems and No regular dental care   Patient Active Problem List   Diagnosis Date Noted   Vitamin D deficiency 03/31/2022   Chronic cough  09/27/2018   Cervical spondylosis 07/11/2018   Lymphocytic colitis 05/21/2018   Anxiety state 04/29/2016   Atypical chest pain 02/22/2016   Dysphagia, pharyngoesophageal phase 02/22/2016   Physical exam 12/15/2014   Left hip pain 09/29/2014   Radicular low back pain 09/11/2014   Bilateral hip bursitis 09/11/2014   BPV (benign positional vertigo) 02/22/2014   HTN (hypertension) 02/22/2014      Patient Care Team: Karie Georges, MD as PCP - General (Family Medicine) Marcelle Overlie, MD as Consulting Physician (Obstetrics and Gynecology)   Outpatient Medications Prior to Visit  Medication Sig   acetaminophen (TYLENOL) 500 MG tablet Take 1,000 mg by mouth every 6 (six) hours as needed.   amLODipine (NORVASC) 10 MG tablet Take 1 tablet (10 mg total) by mouth daily.   aspirin-acetaminophen-caffeine (EXCEDRIN MIGRAINE) 250-250-65 MG tablet Take 1 tablet by mouth every 6 (six) hours as needed for headache.   celecoxib (CELEBREX) 200 MG capsule Take 1 capsule (200 mg total) by mouth 2 (two) times daily as needed.   chlorthalidone (HYGROTON) 25 MG tablet Take 1 tablet (25 mg total) by mouth daily.   gabapentin (NEURONTIN) 300 MG capsule Take 1 capsule (300 mg total) by mouth 3 (three) times daily as needed.   HYDROcodone-acetaminophen (NORCO/VICODIN) 5-325 MG tablet Take 1 tablet by mouth every 6 hours as  needed.   levocetirizine (XYZAL) 5 MG tablet Take 1 tablet (5 mg total) by mouth every evening.   levonorgestrel (MIRENA) 20 MCG/DAY IUD 1 each by Intrauterine route once.   LORazepam (ATIVAN) 0.5 MG tablet Take 1-2 tablets (0.5-1 mg total) by mouth as directed. Take 30-60 minutes prior to MRI. Do not drive while taking this medication.   methocarbamol (ROBAXIN) 500 MG tablet Take 1 tablet (500 mg total) by mouth every 8 (eight) hours as needed for muscle spasms.   montelukast (SINGULAIR) 10 MG tablet Take 1 tablet (10 mg total) by mouth at bedtime.   No facility-administered  medications prior to visit.    Review of Systems  Constitutional:  Negative for chills and fever.  HENT:  Negative for hearing loss.   Eyes:  Negative for blurred vision.  Respiratory:  Negative for shortness of breath.   Cardiovascular:  Negative for chest pain.  Gastrointestinal:  Positive for diarrhea (chronic due ot IBS).  Genitourinary:  Positive for frequency and urgency. Negative for flank pain.  Musculoskeletal:  Positive for back pain.  Neurological:  Negative for headaches.  Psychiatric/Behavioral:  Negative for depression.   All other systems reviewed and are negative.      Objective:     BP (!) 140/92 (BP Location: Right Arm, Patient Position: Sitting, Cuff Size: Large)   Pulse 85   Temp 98.8 F (37.1 C) (Oral)   Ht 5' 3.5" (1.613 m)   Wt 179 lb 3.2 oz (81.3 kg)   SpO2 99%   BMI 31.25 kg/m    Physical Exam Vitals reviewed.  Constitutional:      Appearance: Normal appearance. She is well-groomed and normal weight.  HENT:     Right Ear: Tympanic membrane and ear canal normal.     Left Ear: Tympanic membrane and ear canal normal.     Mouth/Throat:     Mouth: Mucous membranes are moist.     Pharynx: No posterior oropharyngeal erythema.  Eyes:     Conjunctiva/sclera: Conjunctivae normal.  Neck:     Thyroid: No thyromegaly.  Cardiovascular:     Rate and Rhythm: Normal rate and regular rhythm.     Pulses: Normal pulses.     Heart sounds: S1 normal and S2 normal.  Pulmonary:     Effort: Pulmonary effort is normal.     Breath sounds: Normal breath sounds and air entry.  Abdominal:     General: Abdomen is flat. Bowel sounds are normal.     Palpations: Abdomen is soft.     Tenderness: There is no right CVA tenderness, left CVA tenderness or rebound.  Musculoskeletal:     Right lower leg: No edema.     Left lower leg: No edema.  Lymphadenopathy:     Cervical: No cervical adenopathy.  Neurological:     Mental Status: She is alert and oriented to person,  place, and time. Mental status is at baseline.     Gait: Gait is intact.  Psychiatric:        Mood and Affect: Mood and affect normal.        Speech: Speech normal.        Behavior: Behavior normal.        Judgment: Judgment normal.      Results for orders placed or performed in visit on 06/17/23  POCT Urinalysis Dipstick (Automated)  Result Value Ref Range   Color, UA yellow    Clarity, UA clear    Glucose, UA Negative Negative  Bilirubin, UA negative    Ketones, UA negative    Spec Grav, UA 1.020 1.010 - 1.025   Blood, UA small    pH, UA 6.0 5.0 - 8.0   Protein, UA Negative Negative   Urobilinogen, UA 0.2 0.2 or 1.0 E.U./dL   Nitrite, UA negative    Leukocytes, UA Negative Negative       Assessment & Plan:    Routine Health Maintenance and Physical Exam  Immunization History  Administered Date(s) Administered   Hepatitis B, ADULT 08/29/2015, 09/26/2015, 02/06/2016   Influenza, Seasonal, Injecte, Preservative Fre 06/30/2019   Influenza,inj,Quad PF,6+ Mos 06/01/2014, 06/07/2015, 05/18/2017, 05/28/2018   PFIZER Comirnaty(Gray Top)Covid-19 Tri-Sucrose Vaccine 09/19/2019   PFIZER(Purple Top)SARS-COV-2 Vaccination 10/07/2019   Tdap 04/05/2019    Health Maintenance  Topic Date Due   Cervical Cancer Screening (HPV/Pap Cotest)  02/26/2021   COVID-19 Vaccine (3 - Pfizer risk series) 07/03/2023 (Originally 11/04/2019)   INFLUENZA VACCINE  11/30/2023 (Originally 04/02/2023)   HIV Screening  06/16/2024 (Originally 04/11/1990)   Colonoscopy  05/21/2028   DTaP/Tdap/Td (2 - Td or Tdap) 04/04/2029   Hepatitis C Screening  Completed   HPV VACCINES  Aged Out    Discussed health benefits of physical activity, and encouraged her to engage in regular exercise appropriate for her age and condition.  Encounter for general adult medical examination with abnormal findings      -- Normal physical exam findings except for her blood pressure today. We discussed adding another BP medication  and she is agreeable. She continues to monitor her diet /reduce salt and she is walking daily. Counseled patient on low salt diet today. Handouts given on healthy eating and exercise.   Incomplete bladder emptying New symptom today, her UA was done in office and reviewed with patient, there is small blood there but no indication of infection. I recommended referral to urology for an evaluation given that she is having these symptoms and hematuria.   -     POCT Urinalysis Dipstick (Automated) -     Ambulatory referral to Urology  Benign essential microscopic hematuria -     Ambulatory referral to Urology -     Comprehensive metabolic panel -     TSH  Vitamin D deficiency -     VITAMIN D 25 Hydroxy (Vit-D Deficiency, Fractures)  Need for lipid screening -     Lipid panel  Prediabetes -     Hemoglobin A1c  Breast cancer screening by mammogram -     3D Screening Mammogram, Left and Right; Future  Primary hypertension Amlodipine 10 mg daily and chlorthalidone 25 mg daily not controlled her BP, will add olmesartan 10 mg daily and she will continue to check her BP at home. She will send me the readings over my chart.   -     Olmesartan Medoxomil; Take 0.5 tablets (10 mg total) by mouth daily.  Dispense: 90 tablet; Refill: 1    Return in about 6 months (around 12/16/2023).     Karie Georges, MD

## 2023-06-17 NOTE — Telephone Encounter (Signed)
Forwarding to Dr. Denyse Amass to review and advise.

## 2023-06-18 ENCOUNTER — Encounter: Payer: Self-pay | Admitting: Family Medicine

## 2023-06-18 ENCOUNTER — Other Ambulatory Visit (HOSPITAL_COMMUNITY): Payer: Self-pay

## 2023-06-18 MED ORDER — HYDROCODONE-ACETAMINOPHEN 5-325 MG PO TABS
1.0000 | ORAL_TABLET | Freq: Four times a day (QID) | ORAL | 0 refills | Status: DC | PRN
  Filled 2023-06-18: qty 15, 4d supply, fill #0

## 2023-06-19 ENCOUNTER — Ambulatory Visit
Admission: RE | Admit: 2023-06-19 | Discharge: 2023-06-19 | Disposition: A | Discharge status: Home / Self Care | Payer: 59 | Source: Ambulatory Visit | Attending: Family Medicine | Admitting: Family Medicine

## 2023-06-19 ENCOUNTER — Ambulatory Visit: Payer: 59

## 2023-06-19 DIAGNOSIS — Z1231 Encounter for screening mammogram for malignant neoplasm of breast: Secondary | ICD-10-CM | POA: Diagnosis not present

## 2023-06-22 ENCOUNTER — Telehealth: Payer: Self-pay | Admitting: Family Medicine

## 2023-06-22 ENCOUNTER — Ambulatory Visit: Payer: 59 | Admitting: Sports Medicine

## 2023-06-22 ENCOUNTER — Other Ambulatory Visit: Payer: 59

## 2023-06-22 NOTE — Telephone Encounter (Signed)
Patient called stating that her MRI with [facility] has been canceled for a second time.  She asked if the order could be sent somewhere else?   Please advise.

## 2023-06-22 NOTE — Addendum Note (Signed)
Addended by: Debbe Odea R on: 06/22/2023 11:26 AM   Modules accepted: Orders

## 2023-06-23 ENCOUNTER — Other Ambulatory Visit: Payer: 59

## 2023-06-23 NOTE — Telephone Encounter (Signed)
Forwarding to Dr. Denyse Amass to review and advise.   Per visit note 05/26/23:  Assessment and Plan: 47 y.o. female with multifactorial back hip and leg pain.  I think she may have a component of intrinsic hip pathology such as a labrum tear or avascular necrosis and likely does have some lumbar radicular component or perhaps referred pain from facet arthritis.   About just about 3 months ago she had a good response to intra-articular left hip injection lasting until recently.  However the location of her pain is not clear or obvious. She has failed to improve with good physician directed conservative management including injection and home exercise program. Plan for MRI lumbar spine and MRI arthrogram left hip.  Recheck will get the results back.  Could refer directly to orthopedic surgery if her hip pathology supports surgical consultation.   We talked about pain management.  Recommend maximizing Tylenol and Celebrex.

## 2023-06-24 ENCOUNTER — Other Ambulatory Visit (HOSPITAL_COMMUNITY): Payer: Self-pay

## 2023-06-24 ENCOUNTER — Other Ambulatory Visit: Payer: Self-pay

## 2023-06-24 MED ORDER — PREDNISONE 50 MG PO TABS
50.0000 mg | ORAL_TABLET | Freq: Every day | ORAL | 0 refills | Status: DC
  Filled 2023-06-24: qty 5, 5d supply, fill #0

## 2023-06-24 NOTE — Telephone Encounter (Signed)
Normally yes.  I would use my partner Dr. Benjamin Stain to do the hip injection with the contrast to look at the hip labrum.  He will do a cortisone shot at the same time.  However the MRI scanner at his office has been broken for a few weeks now so I switched that location to [facility] imaging which typically will not do a cortisone shot at the same time.  I am happy to do a cortisone shot in your hip whenever you would like me to but waiting until we get the results back for the MRI may be a good idea.

## 2023-06-29 ENCOUNTER — Other Ambulatory Visit: Payer: Self-pay | Admitting: Family Medicine

## 2023-06-29 DIAGNOSIS — M5410 Radiculopathy, site unspecified: Secondary | ICD-10-CM

## 2023-06-30 ENCOUNTER — Other Ambulatory Visit (HOSPITAL_COMMUNITY): Payer: Self-pay

## 2023-06-30 ENCOUNTER — Other Ambulatory Visit: Payer: Self-pay

## 2023-07-02 ENCOUNTER — Other Ambulatory Visit: Payer: Self-pay | Admitting: Family Medicine

## 2023-07-03 ENCOUNTER — Other Ambulatory Visit: Payer: Self-pay

## 2023-07-03 ENCOUNTER — Other Ambulatory Visit (HOSPITAL_COMMUNITY): Payer: Self-pay

## 2023-07-03 MED ORDER — HYDROCODONE-ACETAMINOPHEN 5-325 MG PO TABS
1.0000 | ORAL_TABLET | Freq: Four times a day (QID) | ORAL | 0 refills | Status: DC | PRN
  Filled 2023-07-03: qty 15, 4d supply, fill #0

## 2023-07-16 ENCOUNTER — Ambulatory Visit
Admission: RE | Admit: 2023-07-16 | Discharge: 2023-07-16 | Disposition: A | Discharge status: Home / Self Care | Payer: 59 | Source: Ambulatory Visit | Attending: Family Medicine | Admitting: Family Medicine

## 2023-07-16 DIAGNOSIS — M48061 Spinal stenosis, lumbar region without neurogenic claudication: Secondary | ICD-10-CM | POA: Diagnosis not present

## 2023-07-16 DIAGNOSIS — M5410 Radiculopathy, site unspecified: Secondary | ICD-10-CM

## 2023-07-16 DIAGNOSIS — M47816 Spondylosis without myelopathy or radiculopathy, lumbar region: Secondary | ICD-10-CM | POA: Diagnosis not present

## 2023-07-23 ENCOUNTER — Other Ambulatory Visit: Payer: Self-pay | Admitting: Medical Genetics

## 2023-07-23 DIAGNOSIS — Z006 Encounter for examination for normal comparison and control in clinical research program: Secondary | ICD-10-CM

## 2023-07-24 ENCOUNTER — Encounter: Payer: Self-pay | Admitting: Family Medicine

## 2023-07-27 ENCOUNTER — Telehealth: Payer: Self-pay | Admitting: Family Medicine

## 2023-07-27 ENCOUNTER — Other Ambulatory Visit (HOSPITAL_COMMUNITY): Payer: Self-pay

## 2023-07-27 ENCOUNTER — Other Ambulatory Visit: Payer: Self-pay

## 2023-07-27 ENCOUNTER — Inpatient Hospital Stay: Admission: RE | Admit: 2023-07-27 | Payer: 59 | Source: Ambulatory Visit

## 2023-07-27 MED ORDER — LORAZEPAM 0.5 MG PO TABS
0.5000 mg | ORAL_TABLET | ORAL | 0 refills | Status: DC
  Filled 2023-07-27: qty 4, 2d supply, fill #0

## 2023-07-27 NOTE — Progress Notes (Signed)
On the lumbar spine MRI you do have several areas in your back that could be causing pinched nerves.  We could try a back injection.  Would you like me to arrange for that?

## 2023-07-27 NOTE — Telephone Encounter (Signed)
Ativan prescribed prior to MRI arthrogram

## 2023-07-29 ENCOUNTER — Other Ambulatory Visit: Payer: Self-pay | Admitting: Family Medicine

## 2023-07-29 ENCOUNTER — Encounter: Payer: Self-pay | Admitting: Family Medicine

## 2023-07-29 ENCOUNTER — Other Ambulatory Visit: Payer: Self-pay

## 2023-07-29 ENCOUNTER — Ambulatory Visit: Payer: 59 | Admitting: Family Medicine

## 2023-07-29 VITALS — BP 142/96 | HR 68 | Ht 63.5 in | Wt 180.0 lb

## 2023-07-29 DIAGNOSIS — G8929 Other chronic pain: Secondary | ICD-10-CM | POA: Diagnosis not present

## 2023-07-29 DIAGNOSIS — M25552 Pain in left hip: Secondary | ICD-10-CM

## 2023-07-29 NOTE — Patient Instructions (Addendum)
Thank you for coming in today.   You received an injection today. Seek immediate medical attention if the joint becomes red, extremely painful, or is oozing fluid.   Let's see what the MRI-arthrogram of your hip says and go from there.  Please call [facility] (formally [facility]) at [phone] to schedule your spine injection.

## 2023-07-29 NOTE — Progress Notes (Signed)
Rubin Payor, PhD, LAT, ATC acting as a scribe for Clementeen Graham, MD.  [name] is a 47 y.o. female who presents to [facility] at [facility] today for f/u low back and L hip pain w/ L-spine MRI review. Pt was last seen by Dr. Denyse Amass on 05/26/23 and a lumbar MRI and L-hip MRI-arthrogram were ordered.  Today, pt reports MRI-arthrogram is scheduled for 12/4. She reports pain is worsening and constant. She is now having tightness in the posterior aspect of her L thigh. She thought she was here today for a back injection.  Dx imaging: 07/16/23 L-spine MRI 01/21/23 L-spine XR   Pertinent review of systems: No fevers or chills  Relevant historical information: Hypertension   Exam:  BP (!) 142/96   Pulse 68   Ht 5' 3.5" (1.613 m)   Wt 180 lb (81.6 kg)   SpO2 97%   BMI 31.39 kg/m  General: Well Developed, well nourished, and in no acute distress.   MSK: Left hip normal motion    Lab and Radiology Results  Procedure: Real-time Ultrasound Guided Injection of left hip interarticular femoral acetabular joint approach Device: Philips Affiniti 50G/GE Logiq Images permanently stored and available for review in PACS Verbal informed consent obtained.  Discussed risks and benefits of procedure. Warned about infection, bleeding, hyperglycemia damage to structures among others. Patient expresses understanding and agreement Time-out conducted.   Noted no overlying erythema, induration, or other signs of local infection.   Skin prepped in a sterile fashion.   Local anesthesia: Topical Ethyl chloride.   With sterile technique and under real time ultrasound guidance: 40 mg of Kenalog and 2 mL of Marcaine injected into hip joint. Fluid seen entering the joint capsule.   Completed without difficulty   Pain moderately resolved suggesting accurate placement of the medication.   Advised to call if fevers/chills, erythema, induration, drainage, or persistent bleeding.    Images permanently stored and available for review in the ultrasound unit.  Impression: Technically successful ultrasound guided injection.     EXAM: MRI LUMBAR SPINE WITHOUT CONTRAST   TECHNIQUE: Multiplanar, multisequence MR imaging of the lumbar spine was performed. No intravenous contrast was administered.   COMPARISON:  Lumbar spine x-rays dated Jan 21, 2023. MRI lumbar spine dated October 11, 2014.   FINDINGS: Segmentation:  Standard.   Alignment:  Physiologic.   Vertebrae:  No fracture, evidence of discitis, or bone lesion.   Conus medullaris and cauda equina: Conus extends to the L1-L2 level. Conus and cauda equina appear normal.   Paraspinal and other soft tissues: Negative.   Disc levels:   T12-L1:  Negative.   L1-L2:  Negative.   L2-L3:  Negative.   L3-L4: Negative disc. Progressive mild-to-moderate bilateral facet arthropathy. New mild bilateral lateral recess stenosis. Unchanged mild bilateral neuroforaminal stenosis. No spinal canal stenosis.   L4-L5: Negative disc. Progressive mild-to-moderate bilateral facet arthropathy. Unchanged mild bilateral neuroforaminal stenosis. No spinal canal stenosis.   L5-S1: Negative disc. Progressive mild-to-moderate bilateral facet arthropathy. No stenosis.   IMPRESSION: 1. 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 2. New mild bilateral lateral recess stenosis at L3-L4. Unchanged mild bilateral neuroforaminal stenosis at L3-L4 and L4-L5.     Electronically Signed   By: Obie Dredge M.D.   On: 07/25/2023 12:13   I, Clementeen Graham, personally (independently) visualized and performed the interpretation of the images attached in this note.     Assessment and Plan: 47 y.o. female with left buttocks and  hip pain.  Etiology is unclear.  At the last visit I ordered a lumbar spine MRI and MRI arthrogram of the left hip.  The lumbar spine MRI was done but the hip was not because of some technical  errors.  It is scheduled for December 4.  Unfortunately her lumbar spine MRI I do not think fully explains her pain.  The best candidate would be the facet arthritis at L5-S1 bilaterally.  She did have some relief with an intra-articular hip injection in July.  She has a holidays coming up so she wants some pain relief.  I think it is reasonable to repeat an injection that actually worked reasonably well.  Would do an interarticular injection today.  Anticipate results of this upcoming hip MRI arthrogram in the near future.   PDMP not reviewed this encounter. Orders Placed This Encounter  Procedures   [place] LIMITED JOINT SPACE STRUCTURES LOW LEFT(NO LINKED CHARGES)    Order Specific Question:   Reason for Exam (SYMPTOM  OR DIAGNOSIS REQUIRED)    Answer:   left hip pain    Order Specific Question:   Preferred imaging location?    Answer:   [facility]   No orders of the defined types were placed in this encounter.    Discussed warning signs or symptoms. Please see discharge instructions. Patient expresses understanding.   The above documentation has been reviewed and is accurate and complete Clementeen Graham, M.D.

## 2023-08-04 ENCOUNTER — Encounter: Payer: Self-pay | Admitting: Family Medicine

## 2023-08-05 ENCOUNTER — Ambulatory Visit
Admission: RE | Admit: 2023-08-05 | Discharge: 2023-08-05 | Disposition: A | Discharge status: Home / Self Care | Payer: 59 | Source: Ambulatory Visit | Attending: Family Medicine | Admitting: Family Medicine

## 2023-08-05 ENCOUNTER — Other Ambulatory Visit: Payer: Self-pay

## 2023-08-05 DIAGNOSIS — G8929 Other chronic pain: Secondary | ICD-10-CM

## 2023-08-05 DIAGNOSIS — M25552 Pain in left hip: Secondary | ICD-10-CM

## 2023-08-05 DIAGNOSIS — M79652 Pain in left thigh: Secondary | ICD-10-CM | POA: Diagnosis not present

## 2023-08-05 DIAGNOSIS — M5410 Radiculopathy, site unspecified: Secondary | ICD-10-CM

## 2023-08-05 MED ORDER — IOPAMIDOL (ISOVUE-M 200) INJECTION 41%
14.0000 mL | Freq: Once | INTRAMUSCULAR | Status: AC
  Administered 2023-08-05: 14 mL via INTRA_ARTICULAR

## 2023-08-05 MED ORDER — TIZANIDINE HCL 2 MG PO TABS
2.0000 mg | ORAL_TABLET | Freq: Three times a day (TID) | ORAL | 1 refills | Status: DC | PRN
Start: 2023-08-05 — End: 2023-11-27
  Filled 2023-08-05: qty 60, 10d supply, fill #0
  Filled 2023-08-24: qty 60, 10d supply, fill #1

## 2023-08-06 ENCOUNTER — Other Ambulatory Visit (HOSPITAL_COMMUNITY): Payer: Self-pay

## 2023-08-17 ENCOUNTER — Telehealth: Payer: Self-pay | Admitting: Physical Medicine and Rehabilitation

## 2023-08-17 NOTE — Telephone Encounter (Signed)
Patient called. She would like an appointment with Dr. Newton.  

## 2023-08-19 NOTE — Progress Notes (Signed)
Left hip MRI shows a labrum tear.  You additionally have some tendinitis of the gluteus minimus and gluteus medius tendon that would typically cause pain on the outside of the hip.  The labrum tear typical to cause pain in the front of the hip.  Recommend return to clinic after Christmas to talk about results of MRI and next steps potentially surgical planning.

## 2023-08-20 ENCOUNTER — Other Ambulatory Visit: Payer: Self-pay

## 2023-08-21 ENCOUNTER — Other Ambulatory Visit (HOSPITAL_COMMUNITY)
Admission: RE | Admit: 2023-08-21 | Discharge: 2023-08-21 | Disposition: A | Discharge status: Home / Self Care | Payer: Self-pay | Source: Ambulatory Visit | Attending: Medical Genetics | Admitting: Medical Genetics

## 2023-08-21 ENCOUNTER — Ambulatory Visit: Payer: 59 | Admitting: Physical Medicine and Rehabilitation

## 2023-08-21 ENCOUNTER — Encounter: Payer: Self-pay | Admitting: Physical Medicine and Rehabilitation

## 2023-08-21 DIAGNOSIS — M7918 Myalgia, other site: Secondary | ICD-10-CM

## 2023-08-21 DIAGNOSIS — Z006 Encounter for examination for normal comparison and control in clinical research program: Secondary | ICD-10-CM

## 2023-08-21 DIAGNOSIS — M5412 Radiculopathy, cervical region: Secondary | ICD-10-CM | POA: Diagnosis not present

## 2023-08-21 DIAGNOSIS — M47812 Spondylosis without myelopathy or radiculopathy, cervical region: Secondary | ICD-10-CM

## 2023-08-21 DIAGNOSIS — M4802 Spinal stenosis, cervical region: Secondary | ICD-10-CM | POA: Diagnosis not present

## 2023-08-21 NOTE — Progress Notes (Unsigned)
   Rubin Payor, PhD, LAT, ATC acting as a scribe for Tammy Graham, MD.  [name] is a 47 y.o. female who presents to [facility] at [facility] today for f/u L hip pain w/ MRI arthrogram review. Pt was last seen by Dr. Denyse Amass on 07/29/23 and was given a L interarticular hip steroid injection. Pt sent a MyChart message on 12/3 reporting no relief from CSI and was prescribed tizanidine.  Today, pt reports ***  Dx imaging: 08/05/23 L hip MRI-A 07/16/23 L-spine MRI 03/04/23 L hip XR 01/21/23 L-spine XR  Pertinent review of systems: ***  Relevant historical information: ***   Exam:  There were no vitals taken for this visit. General: Well Developed, well nourished, and in no acute distress.   MSK: ***    Lab and Radiology Results No results found for this or any previous visit (from the past 72 hours). No results found.     Assessment and Plan: 47 y.o. female with ***   PDMP not reviewed this encounter. No orders of the defined types were placed in this encounter.  No orders of the defined types were placed in this encounter.    Discussed warning signs or symptoms. Please see discharge instructions. Patient expresses understanding.   ***

## 2023-08-21 NOTE — Progress Notes (Unsigned)
[name] - 47 y.o. female MRN [id number]  Date of birth: 30-Jun-1976  Office Visit Note: Visit Date: 08/21/2023 PCP: Karie Georges, MD Referred by: Karie Georges, MD  Subjective: Chief Complaint  Patient presents with   Neck - Pain   HPI: [name] is a 47 y.o. female who comes in today for evaluation of chronic, worsening and severe right sided neck pain radiating to shoulder. Pain ongoing for several years, her pain became severe over the last month. Her pain worsens with movement and activity. Side to side movement and using her right arm to do computer work seems to exacerbate her pain. She describes her pain as sharp and aching sensation, currently rates as 7 out of 10. Some relief of pain with home exercise regimen, ice/heat, rest and use of medications. Some relief of pain with Voltaren gel. She does take Celebrex and Gabapentin. History of formal physical therapy/dry needling with minimal relief of pain. Cervical MRI imaging from 2019 exhibits central protrusion and facet arthropathy at C3-C4, there is also bilateral C4 foraminal stenosis. No high grade spinal canal stenosis. Patient has undergone multiple cervical epidural steroid injections in our office, most recent was right C7-T1 interlaminar epidural steroid injection on 11/09/2019, she reports greater than 80% relief of pain for several years with this procedure. 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 Patient denies focal weakness, numbness and tingling. No recent trauma or falls.       Review of Systems  Musculoskeletal:  Positive for myalgias and neck pain.  Neurological:  Negative for tingling, sensory change, focal weakness and weakness.  All other systems reviewed and are negative.  Otherwise per HPI.  Assessment & Plan: Visit Diagnoses:    ICD-10-CM   1. Radiculopathy, cervical region  M54.12 Ambulatory referral to Physical Medicine Rehab    2. Foraminal stenosis  of cervical region  M48.02     3. Facet arthropathy, cervical  M47.812     4. Myofascial pain syndrome  M79.18 Ambulatory referral to Physical Medicine Rehab       Plan: Findings:  Chronic, worsening and severe right sided neck pain radiating to shoulder. Patient continues to have severe pain despite good conservative therapies such as formal physical therapy/dry needling, home exercise regimen, rest and use of medications. Patients clinical presentation and exam are complex, differentials include cervical radiculopathy and facet mediated pain. I also feel there is a myofascial pain contributing to her symptoms, tenderness noted to right levator scapulae region upon palpation today. We discussed treatment plan in detail today, next step is to perform diagnostic and hopefully therapeutic right C7-T1 interlaminar epidural steroid injection under fluoroscopic guidance. She is not currently taking anticoagulants. If good relief of pain with injection we can repeat this procedure infrequently as needed. If we feel her pain is more facet related would consider facet joint injection at the level of C3-C4. Re-grouping with physical therapy for dry needling would also be option to help manage myofascial pain. Cervical MRI imaging if from 2019,  Patient has no questions at this time. No red flag symptoms noted upon exam today.     Meds & Orders: No orders of the defined types were placed in this encounter.   Orders Placed This Encounter  Procedures   Ambulatory referral to Physical Medicine Rehab    Follow-up: Return for Right C7-T1 interlaminar epidural steroid injection.   Procedures: No procedures performed      Clinical History: Narrative & Impression  CLINICAL DATA:  Neck pain, radiating to both shoulders.   EXAM: MRI CERVICAL SPINE WITHOUT CONTRAST   TECHNIQUE: Multiplanar, multisequence MR imaging of the cervical spine was performed. No intravenous contrast was administered.    COMPARISON:  None.   FINDINGS: The patient was unable to remain motionless for the exam. Small or subtle lesions could be overlooked.   Alignment: Anatomic   Vertebrae: No fracture, evidence of discitis, or bone lesion.   Cord: Normal signal and morphology. No significant stenosis or compression.   Posterior Fossa, vertebral arteries, paraspinal tissues: Negative.   Disc levels:   C2-3:  Normal.   C3-4: Central protrusion with osseous spurring. Effacement anterior subarachnoid space. RIGHT greater than LEFT C4 foraminal narrowing is compounded by facet arthropathy.   C4-5:  Tiny central protrusion.  No impingement.   C5-6:  Slight disc desiccation.  Facet arthropathy.  No impingement.   C6-7:  Normal interspace.   C7-T1:  Mild facet arthropathy.  Normal disc space.  No impingement.   IMPRESSION: Mild multilevel spondylosis. Central protrusion with osseous spurring at C3-4 in conjunction with facet arthropathy may be the symptomatic level. BILATERAL C4 foraminal narrowing is observed.     Electronically Signed   By: Elsie Stain M.D.   On: 05/26/2018 14:58   She reports that she has never smoked. She has never used smokeless tobacco.  Recent Labs    12/08/22 1043 06/17/23 1152  HGBA1C 5.6 5.9    Objective:  VS:  HT:    WT:   BMI:     BP:   HR: bpm  TEMP: ( )  RESP:  Physical Exam Vitals and nursing note reviewed.  HENT:     Head: Normocephalic and atraumatic.     Right Ear: External ear normal.     Left Ear: External ear normal.     Nose: Nose normal.     Mouth/Throat:     Mouth: Mucous membranes are moist.  Eyes:     Extraocular Movements: Extraocular movements intact.  Cardiovascular:     Rate and Rhythm: Normal rate.     Pulses: Normal pulses.  Pulmonary:     Effort: Pulmonary effort is normal.  Abdominal:     General: Abdomen is flat. There is no distension.  Musculoskeletal:        General: Tenderness present.     Cervical back:  Tenderness present.     Comments: Discomfort noted with extension and side to side rotation. Patient has good strength in the upper extremities including 5 out of 5 strength in wrist extension, long finger flexion and APB. Shoulder range of motion is full bilaterally without any sign of impingement. There is no atrophy of the hands intrinsically. Sensation intact bilaterally. Tenderness noted to right levator scapulae region. Negative Hoffman's sign. Negative Spurling's sign.     Skin:    General: Skin is warm and dry.     Capillary Refill: Capillary refill takes less than 2 seconds.  Neurological:     General: No focal deficit present.     Mental Status: She is alert and oriented to person, place, and time.  Psychiatric:        Mood and Affect: Mood normal.        Behavior: Behavior normal.     Ortho Exam  Imaging: No results found.  Past Medical/Family/Surgical/Social History: Medications & Allergies reviewed per EMR, new medications updated. Patient Active Problem List   Diagnosis Date Noted   Vitamin D deficiency 03/31/2022  Chronic cough 09/27/2018   Cervical spondylosis 07/11/2018   Lymphocytic colitis 05/21/2018   Anxiety state 04/29/2016   Atypical chest pain 02/22/2016   Dysphagia, pharyngoesophageal phase 02/22/2016   Physical exam 12/15/2014   Left hip pain 09/29/2014   Radicular low back pain 09/11/2014   Bilateral hip bursitis 09/11/2014   BPV (benign positional vertigo) 02/22/2014   HTN (hypertension) 02/22/2014   Past Medical History:  Diagnosis Date   Arthritis    right foot   Asthma    Congenital flat foot    has had bilateral foot surgery to correct   Frequent headaches    GERD (gastroesophageal reflux disease)    Hypertension    Irritable bowel syndrome (IBS)    no current med.   Painful orthopaedic hardware [facility]) 03/2013   right foot   Family History  Problem Relation Age of Onset   Hypertension Mother    Hypertension Father    Diabetes  Father    Irritable bowel syndrome Father    Arrhythmia Father    Breast cancer Paternal Grandmother    Colon cancer Cousin        maternal   Rectal cancer Neg Hx    Esophageal cancer Neg Hx    Stomach cancer Neg Hx    Past Surgical History:  Procedure Laterality Date   CALCANEAL OSTEOTOMY Right 12/23/2012   Procedure: RIGHT CALCANEAL OSTEOTOMY, RIGHT LATERAL COLUMN LENGTHENING, RIGHT COTTON OSTEOTOMY, RIGHT FLEX DIGITORIUM LONGUS TRANSFER, RIGHT KIDNER PROCEDURE ;  Surgeon: Toni Arthurs, MD;  Location: [facility];  Service: Orthopedics;  Laterality: Right;   CESAREAN SECTION  07/13/2005; 06/03/2006   DILATION AND EVACUATION  08/16/2008   FOOT SURGERY Left 2002   FOOT SURGERY Right 10/2018   GASTROCNEMIUS RECESSION Right 12/23/2012   Procedure: RIGHT GASTRO RECESSION ;  Surgeon: Toni Arthurs, MD;  Location: [facility];  Service: Orthopedics;  Laterality: Right;   HARDWARE REMOVAL Right 03/24/2013   Procedure: HARDWARE REMOVAL RIGHT HEEL;  Surgeon: Toni Arthurs, MD;  Location:  SURGERY CENTER;  Service: Orthopedics;  Laterality: Right;   INTRAUTERINE DEVICE INSERTION     WISDOM TOOTH EXTRACTION     Social History   Occupational History   Occupation: Teacher, adult education: [employer]  Tobacco Use   Smoking status: Never   Smokeless tobacco: Never  Vaping Use   Vaping status: Never Used  Substance and Sexual Activity   Alcohol use: No   Drug use: No   Sexual activity: Yes    Birth control/protection: I.U.D.

## 2023-08-21 NOTE — Progress Notes (Unsigned)
Neck injection 11/09/19 1 week ago the pain came,  R side neck pain, R arm pain with weekness.

## 2023-08-24 ENCOUNTER — Other Ambulatory Visit (HOSPITAL_COMMUNITY): Payer: Self-pay

## 2023-08-27 DIAGNOSIS — R3915 Urgency of urination: Secondary | ICD-10-CM | POA: Diagnosis not present

## 2023-08-27 DIAGNOSIS — R3914 Feeling of incomplete bladder emptying: Secondary | ICD-10-CM | POA: Diagnosis not present

## 2023-08-27 DIAGNOSIS — R8271 Bacteriuria: Secondary | ICD-10-CM | POA: Diagnosis not present

## 2023-08-27 DIAGNOSIS — R3121 Asymptomatic microscopic hematuria: Secondary | ICD-10-CM | POA: Diagnosis not present

## 2023-08-31 ENCOUNTER — Encounter: Payer: Self-pay | Admitting: Family Medicine

## 2023-08-31 ENCOUNTER — Ambulatory Visit: Payer: 59 | Admitting: Family Medicine

## 2023-08-31 ENCOUNTER — Other Ambulatory Visit: Payer: Self-pay

## 2023-08-31 VITALS — BP 124/82 | HR 76 | Ht 63.5 in | Wt 182.0 lb

## 2023-08-31 DIAGNOSIS — G8929 Other chronic pain: Secondary | ICD-10-CM

## 2023-08-31 DIAGNOSIS — M25552 Pain in left hip: Secondary | ICD-10-CM | POA: Diagnosis not present

## 2023-08-31 LAB — GENECONNECT MOLECULAR SCREEN: Genetic Analysis Overall Interpretation: NEGATIVE

## 2023-08-31 NOTE — Patient Instructions (Signed)
Thank you for coming in today.   You received an injection today. Seek immediate medical attention if the joint becomes red, extremely painful, or is oozing fluid.  

## 2023-09-04 ENCOUNTER — Other Ambulatory Visit: Payer: Self-pay

## 2023-09-04 ENCOUNTER — Encounter: Payer: Self-pay | Admitting: Family Medicine

## 2023-09-04 DIAGNOSIS — G8929 Other chronic pain: Secondary | ICD-10-CM

## 2023-09-04 NOTE — Progress Notes (Signed)
 am

## 2023-09-07 ENCOUNTER — Other Ambulatory Visit (HOSPITAL_COMMUNITY): Payer: Self-pay

## 2023-09-07 MED ORDER — HYDROCODONE-ACETAMINOPHEN 5-325 MG PO TABS
1.0000 | ORAL_TABLET | Freq: Four times a day (QID) | ORAL | 0 refills | Status: DC | PRN
Start: 2023-09-07 — End: 2023-09-29
  Filled 2023-09-07 (×2): qty 15, 4d supply, fill #0

## 2023-09-08 ENCOUNTER — Other Ambulatory Visit: Payer: Self-pay

## 2023-09-11 ENCOUNTER — Telehealth: Payer: Self-pay | Admitting: Physical Medicine and Rehabilitation

## 2023-09-11 NOTE — Telephone Encounter (Signed)
 Patient called and needs to reschedule her appt. For Monday. OV#[phone]

## 2023-09-14 ENCOUNTER — Encounter: Payer: 59 | Admitting: Physical Medicine and Rehabilitation

## 2023-09-28 ENCOUNTER — Encounter: Payer: Self-pay | Admitting: Orthopaedic Surgery

## 2023-09-28 ENCOUNTER — Ambulatory Visit: Payer: Commercial Managed Care - PPO | Admitting: Orthopaedic Surgery

## 2023-09-28 ENCOUNTER — Encounter: Payer: 59 | Admitting: Physical Medicine and Rehabilitation

## 2023-09-28 ENCOUNTER — Encounter: Payer: Self-pay | Admitting: Family Medicine

## 2023-09-28 DIAGNOSIS — S73192D Other sprain of left hip, subsequent encounter: Secondary | ICD-10-CM

## 2023-09-28 DIAGNOSIS — M25552 Pain in left hip: Secondary | ICD-10-CM

## 2023-09-28 NOTE — Progress Notes (Signed)
The patient is a very pleasant 48 year old female sent from Dr. Clementeen Graham to evaluate her left hip.  However I believe the patient was supposed to have appointment with Dr. Steward Drone for her current left hip issues.  I did review her MRI studies of the left hip and she does have a labral tear and some significant tendinosis of the hip abductor tendons.  There was no chondral defect of the femoral head or acetabulum.  A lot of her pain seems to also be in the ischium and some in the groin on that left side.  Even on exam she has full and fluid range of motion of her left hip but it is painful when she crossed her leg a lot of her pain also is over the trochanteric area and the ischium.  I let her know that we can get her an appointment soon with my partner to evaluate her left hip further.  That said she would like to see as well.

## 2023-09-29 ENCOUNTER — Other Ambulatory Visit: Payer: Self-pay

## 2023-09-29 MED ORDER — HYDROCODONE-ACETAMINOPHEN 5-325 MG PO TABS
1.0000 | ORAL_TABLET | Freq: Four times a day (QID) | ORAL | 0 refills | Status: DC | PRN
  Filled 2023-09-29: qty 15, 4d supply, fill #0

## 2023-09-30 DIAGNOSIS — R3129 Other microscopic hematuria: Secondary | ICD-10-CM | POA: Diagnosis not present

## 2023-09-30 DIAGNOSIS — R3121 Asymptomatic microscopic hematuria: Secondary | ICD-10-CM | POA: Diagnosis not present

## 2023-10-07 ENCOUNTER — Ambulatory Visit (HOSPITAL_BASED_OUTPATIENT_CLINIC_OR_DEPARTMENT_OTHER): Payer: Commercial Managed Care - PPO | Admitting: Orthopaedic Surgery

## 2023-10-07 DIAGNOSIS — S73192D Other sprain of left hip, subsequent encounter: Secondary | ICD-10-CM | POA: Diagnosis not present

## 2023-10-07 DIAGNOSIS — M25552 Pain in left hip: Secondary | ICD-10-CM | POA: Diagnosis not present

## 2023-10-07 NOTE — Progress Notes (Signed)
 Chief Complaint: Left hip pain     History of Present Illness:    [name] is a 48 y.o. female presents today with ongoing left hip pain which is C-shaped in nature but does radiate to the posterior hamstring.  She has had this since September when she was helping to lift her mother-in-law and since that time felt like she had to strain that has not resolved.  She has subsequently seen Dr. Joane as well as Dr. Vernetta.  She has had 2 injections now with any relief.  She is having a hard time from going from sit to stand.  Has a hard time with prolonged working.  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  She is taking Tylenol  as well as tizanidine  without any relief.  She is having a very hard time staying active with her hip pain.  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   Painful orthopaedic hardware [facility]) 03/2013   right foot   Past Surgical History:  Procedure Laterality Date   CALCANEAL OSTEOTOMY Right 12/23/2012   Procedure: RIGHT CALCANEAL OSTEOTOMY, RIGHT LATERAL COLUMN LENGTHENING, RIGHT COTTON OSTEOTOMY, RIGHT FLEX DIGITORIUM LONGUS TRANSFER, RIGHT KIDNER PROCEDURE ;  Surgeon: Norleen Armor, MD;  Location: [facility];  Service: Orthopedics;  Laterality: Right;   CESAREAN SECTION  07/13/2005; 06/03/2006   DILATION AND EVACUATION  08/16/2008   FOOT SURGERY Left 2002   FOOT SURGERY Right 10/2018   GASTROCNEMIUS RECESSION Right 12/23/2012   Procedure: RIGHT GASTRO RECESSION ;  Surgeon: Norleen Armor, MD;  Location: [facility];  Service: Orthopedics;  Laterality: Right;    HARDWARE REMOVAL Right 03/24/2013   Procedure: HARDWARE REMOVAL RIGHT HEEL;  Surgeon: Norleen Armor, MD;  Location: [facility];  Service: Orthopedics;  Laterality: Right;   INTRAUTERINE DEVICE INSERTION     WISDOM TOOTH EXTRACTION     Social History   Socioeconomic History   Marital status: Married    Spouse name: Not on file   Number of children: 3   Years of education: Not on file   Highest education level: Bachelor's degree (e.g., BA, AB, BS)  Occupational History   Occupation: Teacher, Adult Education: [employer]  Tobacco Use   Smoking status: Never   Smokeless tobacco: Never  Vaping Use   Vaping status: Never Used  Substance and Sexual Activity   Alcohol use: No   Drug use: No   Sexual activity: Yes    Birth control/protection: I.U.D.  Other Topics Concern   Not on file  Social History Narrative   Not on file   Social Drivers of Health   Financial Resource Strain: Low Risk  (06/16/2023)   Overall Financial Resource Strain (CARDIA)    Difficulty of Paying Living Expenses: Not very hard  Food Insecurity: No Food Insecurity (06/16/2023)   Hunger Vital Sign    Worried About Running Out of Food in the Last Year: Never true    Ran Out of Food in the Last Year: Never true  Transportation Needs: No Transportation Needs (06/16/2023)   PRAPARE - Administrator, Civil Service (Medical): No    Lack of Transportation (Non-Medical): No  Physical Activity: Unknown (06/16/2023)   Exercise Vital Sign    Days of Exercise per Week: 0 days    Minutes of Exercise per Session: Not on file  Stress: Stress Concern Present (06/16/2023)   [employer] of Occupational Health - Occupational Stress Questionnaire    Feeling of Stress : Very much  Social Connections: Socially Integrated (06/16/2023)   Social Connection and Isolation Panel [NHANES]    Frequency of Communication with Friends and Family: More than three times a week    Frequency of Social Gatherings with  Friends and Family: Once a week    Attends Religious Services: More than 4 times per year    Active Member of [employer] or Organizations: Yes    Attends Engineer, Structural: More than 4 times per year    Marital Status: Married   Family History  Problem Relation Age of Onset   Hypertension Mother    Hypertension Father    Diabetes Father    Irritable bowel syndrome Father    Arrhythmia Father    Breast cancer Paternal Grandmother    Colon cancer Cousin        maternal   Rectal cancer Neg Hx    Esophageal cancer Neg Hx    Stomach cancer Neg Hx    Allergies  Allergen Reactions   Augmentin  [Amoxicillin -Pot Clavulanate] Nausea And Vomiting   Biotin Other (See Comments)    Severe yeast infection   Sulfa Antibiotics Other (See Comments)    Reaction:  Muscle pain    Current Outpatient Medications  Medication Sig Dispense Refill   acetaminophen  (TYLENOL ) 500 MG tablet Take 1,000 mg by mouth every 6 (six) hours as needed.     amLODipine  (NORVASC ) 10 MG tablet Take 1 tablet (10 mg total) by mouth daily. 90 tablet 1   aspirin -acetaminophen -caffeine (EXCEDRIN MIGRAINE) 250-250-65 MG tablet Take 1 tablet by mouth every 6 (six) hours as needed for headache.     celecoxib  (CELEBREX ) 200 MG capsule Take 1 capsule (200 mg total) by mouth 2 (two) times daily as needed. 60 capsule 5   chlorthalidone  (HYGROTON ) 25 MG tablet Take 1 tablet (25 mg total) by mouth daily. 90 tablet 1   gabapentin  (NEURONTIN ) 300 MG capsule Take 1 capsule (300 mg total) by mouth 3 (three) times daily as needed. 90 capsule 3   HYDROcodone -acetaminophen  (NORCO/VICODIN) 5-325 MG tablet Take 1 tablet by mouth every 6 (six) hours as needed. 15 tablet 0   levocetirizine (XYZAL ) 5 MG tablet Take 1 tablet (5 mg total) by mouth every evening. 90 tablet 1   levonorgestrel (MIRENA) 20 MCG/DAY IUD 1 each by Intrauterine route once.     montelukast  (SINGULAIR ) 10 MG tablet Take 1 tablet (10 mg total) by mouth at bedtime. 90  tablet 1   olmesartan  (BENICAR ) 20 MG tablet Take 0.5 tablets (10 mg total) by mouth daily. 90 tablet 1   tiZANidine  (ZANAFLEX ) 2 MG tablet Take 1-2 tablets (2-4 mg total) by mouth every 8 (eight) hours as needed. 60 tablet 1   No current facility-administered medications for this visit.   No results found.  Review of Systems:   A ROS was performed including pertinent positives and negatives as documented in the HPI.  Physical Exam :   Constitutional: NAD and appears stated age Neurological: Alert and oriented Psych:  Appropriate affect and cooperative There were no vitals taken for this visit.   Comprehensive Musculoskeletal Exam:    Inspection Right Left  Skin No atrophy or gross abnormalities appreciated No atrophy or gross abnormalities appreciated  Palpation    Tenderness None None  Crepitus None None  Range of Motion    Flexion (passive) 120 120  Extension 30 30  IR 30 30 with pain  ER 45 45 with pain  Strength    Flexion  5/5 5/5  Extension 5/5 5/5  Special Tests    FABER Negative Negative  FADIR Negative Positive  ER Lag/Capsular Insufficiency Negative Negative  Instability Negative Negative  Sacroiliac pain Negative  Negative   Instability    Generalized Laxity No No  Neurologic    sciatic, femoral, obturator nerves intact to light sensation  Vascular/Lymphatic    DP pulse 2+ 2+  Lumbar Exam    Patient has symmetric lumbar range of motion with negative pain referral to hip      Imaging:   Xray (3 views left hip): Normal  MRI (left hip): Anterior superior labral tear   I personally reviewed and interpreted the radiographs.   Assessment and Plan:   48 y.o. female with left hip pain consistent with a left hip labral tear.  At today's visit I did discuss that ultimately I do believe this is responsible for the majority of her symptoms.  She has been trialing strengthening as well as 2 injections and anti-inflammatories.  She has not gotten relief from  any of this.  I would like to enroll her in formal physical therapy at this time.  I did discuss the possibility of left hip arthroscopy with labral repair given her duration of symptoms.  She would like to further consider this.  Discussed patient's as well as the associated rehab time.  After discussion of all of this she is likely plan for left hip arthroscopy with labral pair  -Plan for left hip arthroscopy with labral repair   After a lengthy discussion of treatment options, including risks, benefits, alternatives, complications of surgical and nonsurgical conservative options, the patient elected surgical repair.   The patient  is aware of the material risks  and complications including, but not limited to injury to adjacent structures, neurovascular injury, infection, numbness, bleeding, implant failure, thermal burns, stiffness, persistent pain, failure to heal, disease transmission from allograft, need for further surgery, dislocation, anesthetic risks, blood clots, risks of death,and others. The probabilities of surgical success and failure discussed with patient given their particular co-morbidities.The time and nature of expected rehabilitation and recovery was discussed.The patient's questions were all answered preoperatively.  No barriers to understanding were noted. I explained the natural history of the disease process and Rx rationale.  I explained to the patient what I considered to be reasonable expectations given their personal situation.  The final treatment plan was arrived at through a shared patient decision making process model.    I personally saw and evaluated the patient, and participated in the management and treatment plan.  Elspeth Parker, MD Attending Physician, Orthopedic Surgery  This document was dictated using Dragon voice recognition software. A reasonable attempt at proof reading has been made to minimize errors.

## 2023-10-13 DIAGNOSIS — R3121 Asymptomatic microscopic hematuria: Secondary | ICD-10-CM | POA: Diagnosis not present

## 2023-10-14 ENCOUNTER — Telehealth: Payer: Self-pay | Admitting: Orthopaedic Surgery

## 2023-10-14 ENCOUNTER — Other Ambulatory Visit (HOSPITAL_BASED_OUTPATIENT_CLINIC_OR_DEPARTMENT_OTHER): Payer: Self-pay | Admitting: Orthopaedic Surgery

## 2023-10-14 DIAGNOSIS — S73192D Other sprain of left hip, subsequent encounter: Secondary | ICD-10-CM

## 2023-10-14 NOTE — Telephone Encounter (Signed)
[employer] forms received. To [employer].

## 2023-10-19 ENCOUNTER — Encounter (HOSPITAL_BASED_OUTPATIENT_CLINIC_OR_DEPARTMENT_OTHER): Payer: Self-pay | Admitting: Physical Therapy

## 2023-10-19 ENCOUNTER — Ambulatory Visit (HOSPITAL_BASED_OUTPATIENT_CLINIC_OR_DEPARTMENT_OTHER): Payer: Commercial Managed Care - PPO | Attending: Orthopaedic Surgery | Admitting: Physical Therapy

## 2023-10-19 ENCOUNTER — Other Ambulatory Visit: Payer: Self-pay

## 2023-10-19 DIAGNOSIS — S73192D Other sprain of left hip, subsequent encounter: Secondary | ICD-10-CM | POA: Insufficient documentation

## 2023-10-19 DIAGNOSIS — M25552 Pain in left hip: Secondary | ICD-10-CM | POA: Diagnosis not present

## 2023-10-19 DIAGNOSIS — M6281 Muscle weakness (generalized): Secondary | ICD-10-CM | POA: Insufficient documentation

## 2023-10-19 DIAGNOSIS — R262 Difficulty in walking, not elsewhere classified: Secondary | ICD-10-CM | POA: Diagnosis not present

## 2023-10-19 NOTE — Therapy (Signed)
OUTPATIENT PHYSICAL THERAPY LOWER EXTREMITY EVALUATION   Patient Name: [name] MRN: [id number] DOB:12-Dec-1975, 48 y.o., female Today's Date: 10/19/2023  END OF SESSION:  PT End of Session - 10/19/23 1123     Visit Number 1    Number of Visits 5    Date for PT Re-Evaluation 11/30/23    Authorization Type [facility] [employer]    PT Start Time 0930    PT Stop Time 1010    PT Time Calculation (min) 40 min    Activity Tolerance Patient tolerated treatment well;Patient limited by pain    Behavior During Therapy WFL for tasks assessed/performed             Past Medical History:  Diagnosis Date   Arthritis    right foot   Asthma    Congenital flat foot    has had bilateral foot surgery to correct   Frequent headaches    GERD (gastroesophageal reflux disease)    Hypertension    Irritable bowel syndrome (IBS)    no current med.   Painful orthopaedic hardware [facility]) 03/2013   right foot   Past Surgical History:  Procedure Laterality Date   CALCANEAL OSTEOTOMY Right 12/23/2012   Procedure: RIGHT CALCANEAL OSTEOTOMY, RIGHT LATERAL COLUMN LENGTHENING, RIGHT COTTON OSTEOTOMY, RIGHT FLEX DIGITORIUM LONGUS TRANSFER, RIGHT KIDNER PROCEDURE ;  Surgeon: Toni Arthurs, MD;  Location: [facility];  Service: Orthopedics;  Laterality: Right;   CESAREAN SECTION  07/13/2005; 06/03/2006   DILATION AND EVACUATION  08/16/2008   FOOT SURGERY Left 2002   FOOT SURGERY Right 10/2018   GASTROCNEMIUS RECESSION Right 12/23/2012   Procedure: RIGHT GASTRO RECESSION ;  Surgeon: Toni Arthurs, MD;  Location: [facility];  Service: Orthopedics;  Laterality: Right;   HARDWARE REMOVAL Right 03/24/2013   Procedure: HARDWARE REMOVAL RIGHT HEEL;  Surgeon: Toni Arthurs, MD;  Location: [facility];  Service: Orthopedics;  Laterality: Right;   INTRAUTERINE DEVICE INSERTION     WISDOM TOOTH EXTRACTION     Patient Active Problem List   Diagnosis Date Noted   Vitamin D deficiency  03/31/2022   Chronic cough 09/27/2018   Cervical spondylosis 07/11/2018   Lymphocytic colitis 05/21/2018   Anxiety state 04/29/2016   Atypical chest pain 02/22/2016   Dysphagia, pharyngoesophageal phase 02/22/2016   Physical exam 12/15/2014   Left hip pain 09/29/2014   Radicular low back pain 09/11/2014   Bilateral hip bursitis 09/11/2014   BPV (benign positional vertigo) 02/22/2014   HTN (hypertension) 02/22/2014    PCP: Karie Georges, MD   REFERRING PROVIDER:  Huel Cote, MD     REFERRING DIAG:  [phone] (ICD-10-CM) - Tear of left acetabular labrum, subsequent encounter      THERAPY DIAG:  Left hip pain - Plan: PT plan of care cert/re-cert  Difficulty walking - Plan: PT plan of care cert/re-cert  Muscle weakness (generalized) - Plan: PT plan of care cert/re-cert  Rationale for Evaluation and Treatment: Rehabilitation  ONSET DATE: Sept 2024  SUBJECTIVE:   SUBJECTIVE STATEMENT: Pt reports she thinks pain started with helping with transferring/caring for MIL at home. Denies specific traumatic MOI. Pt denies relief of pain with injections. Pt does have history of L sided back pain, occurred at the same. Pt does have extensive history of bilateral foot/ankle surgeries. Pt was using the gym prior to pain onset-  was walking, no resistance. Would like to consider TPDN for pain management prior to surgery.   PERTINENT HISTORY: Bilat foot  surgery for arch height with hardware removal PAIN:  Are you having pain? Yes: NPRS scale: 5/10 Pain location: posterior hip and HS  Pain description: aching Aggravating factors: bending, stairs, sitting, standing, transfers, getting in and out of cars, rolling over in bed, sleeping on either side  Relieving factors: meds, resting with leg straight   PRECAUTIONS: None  RED FLAGS: None   WEIGHT BEARING RESTRICTIONS: No  FALLS:  Has patient fallen in last 6 months? 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  DIAGNOSTIC FINDINGS:   IMPRESSION: 1. 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 No chondral abnormality. 2. Osseous prominence of the right superior anterior femoral head-neck junction. 3. Moderate tendinosis of the right gluteus minimus tendon insertion with mild peritendinous edema. Mild tendinosis of the right gluteus medius tendon insertion.     Electronically Signed   By: Elige Ko M.D.   On: 08/18/2023 15:01  PATIENT SURVEYS:  LEFS Lower Extremity Functional Score: 24 / 80 = 30.0 %  COGNITION: Overall cognitive status: Within functional limits for tasks assessed     SENSATION: WFL  MUSCLE LENGTH: Positive supine HS 90/90 Positive Thomas  POSTURE: weight shift right and R SB  PALPATION: Hypertonicity of L lumbar paraspinals and QL, R hip rotators; TTP in region of quadratus femoris  L lumbar tightness with SB and rotation   LOWER EXTREMITY ROM: WFL for tasks assessed on R  L ROM limited by pain: flexion 100, ext 0, ER full but painful at end range, IR full but painful at end range, Abd 30  LOWER EXTREMITY MMT:  MMT Right eval Left Eval  Painful throughout  Hip flexion 4+/5 4+/5  Hip extension 4+/5 4+/5  Hip abduction 4+/5 4+/5  Hip adduction 4+/5 4+/5  Hip internal rotation    Hip external rotation 4+/5 4+/5  Knee flexion    Knee extension    Ankle dorsiflexion    Ankle plantarflexion    Ankle inversion    Ankle eversion     (Blank rows = not tested)  LOWER EXTREMITY SPECIAL TESTS:  Hip special tests: Luisa Hart (FABER) test: positive  , Hip scouring test: positive , and leg roll: positive  FUNCTIONAL TESTS:  STS transfer: requires UE, offweight of L LE  GAIT: Distance walked: 24ft Assistive device utilized: None Level of assistance: Complete Independence Comments: mildly antalgic, increase lumbar lordosis  TREATMENT DATE: 2/17   Exercises - Hooklying Single Knee to Chest Stretch  - 1-2 x daily - 7 x weekly - 1 sets - 10 reps - 5 hold - Modified Thomas Stretch  - 1-2 x daily - 7 x weekly - 1 sets - 3 reps - 30 hold - Bridge with Posterior Pelvic Tilt  - 1-2 x daily - 7 x weekly - 2 sets - 10 reps - Supine March with Posterior Pelvic Tilt  - 1-2 x daily - 7 x weekly - 1 sets - 20 reps - Seated Quadratus Lumborum Stretch in Chair  - 1-2 x daily - 7 x weekly - 1 sets - 3 reps - 30 hold    PATIENT EDUCATION:  Education details: MOI, diagnosis, prognosis, anatomy, post op expectations, exercise progression, DOMS expectations, muscle firing,  envelope of function, HEP, POC  Person educated: Patient Education method: Explanation, Demonstration, Tactile cues, Verbal cues, and Handouts Education comprehension: verbalized understanding, returned demonstration, verbal cues required, tactile cues required, and needs further education  HOME EXERCISE PROGRAM:  Access Code: [email or web address] URL: [email or web address] Date: 10/19/2023 Prepared by: Zebedee Iba  ASSESSMENT:  CLINICAL IMPRESSION: Patient is a 48 y.o. female who was seen today for physical therapy evaluation and treatment for c/c of L hip and groin pain. 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 Pt's pain is highly sensitive and irritable with movement. Pt's is strength ROM limited at this time due to pain increase with movement. Plan to continue with TPDN for pain management and  strengthening of the hip as tolerated at future sessions. Consider heavy isometric hip ABD holds at next for HEP in addition to manual and joint mobilizations to improve lumbopelvic mechanics. Pt is schedule for surgical labral repair on 11/09/2023. Pt would benefit from continued skilled therapy in order to reach goals and maximize functional L hip strength and ROM prior to surgical intervention.   OBJECTIVE IMPAIRMENTS: Abnormal gait, decreased activity tolerance, decreased endurance, decreased mobility, difficulty walking, decreased ROM, decreased strength, hypomobility, increased muscle spasms, impaired flexibility, improper body mechanics, postural dysfunction, and pain.   ACTIVITY LIMITATIONS: carrying, lifting, bending, sitting, standing, squatting, sleeping, stairs, transfers, dressing, and locomotion level  PARTICIPATION LIMITATIONS: cleaning, laundry, interpersonal relationship, driving, shopping, community activity, occupation, and yard work  PERSONAL FACTORS: Fitness, Past/current experiences, Time since onset of injury/illness/exacerbation, and 1-2 comorbidities:    are also affecting patient's functional outcome.   REHAB POTENTIAL: Good  CLINICAL DECISION MAKING: Evolving/moderate complexity  EVALUATION COMPLEXITY: Moderate   GOALS:   SHORT TERM GOALS: Target date: 11/30/2023  Pt will become independent with HEP in order to demonstrate synthesis of PT education for pre-operative pain management.  Baseline: Goal status: INITIAL  2.  Pt will report at least 2 pt reduction on NPRS scale for pain in order to demonstrate functional improvement with household activity, self care, and ADL.  Baseline:  Goal status: INITIAL  3.  Pt will be able to demonstrate/report ability to sit/stand > 10 mins without increase in pain in order to demonstrate functional improvement and tolerance to static positioning.  Baseline:  Goal status: INITIAL  LONG TERM GOALS: to be set post -op    PLAN:  PT FREQUENCY: 1x/week  PT DURATION: 12 weeks (end at 4-6 for pre-op)  PLANNED INTERVENTIONS: 97164- PT Re-evaluation, 97110-Therapeutic exercises, 97530- Therapeutic activity, [id number]- Neuromuscular re-education, 97535- Self Care, 16109- Manual therapy, [id number]- Gait training, [phone]- Orthotic Fit/training, [id number]- Aquatic Therapy, [phone]- Splinting, B1478- Electrical  stimulation (unattended), [phone]- Electrical stimulation (manual), 60454- Vasopneumatic device, [id number]- Ultrasound, [id number]- Traction (mechanical), 09811- Ionotophoresis 4mg /ml Dexamethasone, Patient/Family education, Balance training, Stair training, Taping, Dry Needling, Joint mobilization, Joint manipulation, Spinal manipulation, Spinal mobilization, Scar mobilization, Compression bandaging, DME instructions, Cryotherapy, Moist heat, and Biofeedback  PLAN FOR NEXT SESSION: TPDN for pain management PRN, recheck HEP for pre-op ROM and strengthening, progress as tolerated    Zebedee Iba, PT 10/19/2023, 11:24 AM

## 2023-10-21 ENCOUNTER — Other Ambulatory Visit: Payer: Self-pay | Admitting: Family Medicine

## 2023-10-21 DIAGNOSIS — I10 Essential (primary) hypertension: Secondary | ICD-10-CM

## 2023-10-22 ENCOUNTER — Other Ambulatory Visit (HOSPITAL_COMMUNITY): Payer: Self-pay

## 2023-10-22 ENCOUNTER — Telehealth: Payer: Self-pay | Admitting: Orthopaedic Surgery

## 2023-10-22 MED ORDER — CHLORTHALIDONE 25 MG PO TABS
25.0000 mg | ORAL_TABLET | Freq: Every day | ORAL | 1 refills | Status: DC
  Filled 2023-10-22: qty 90, 90d supply, fill #0
  Filled 2024-03-07: qty 90, 90d supply, fill #1

## 2023-10-22 MED ORDER — AMLODIPINE BESYLATE 10 MG PO TABS
10.0000 mg | ORAL_TABLET | Freq: Every day | ORAL | 1 refills | Status: DC
  Filled 2023-10-22: qty 90, 90d supply, fill #0
  Filled 2024-02-20: qty 90, 90d supply, fill #1

## 2023-10-22 NOTE — Telephone Encounter (Signed)
Received auth & $25 cash for forms. To [employer].

## 2023-10-28 ENCOUNTER — Encounter (HOSPITAL_BASED_OUTPATIENT_CLINIC_OR_DEPARTMENT_OTHER): Payer: Commercial Managed Care - PPO | Admitting: Physical Therapy

## 2023-10-28 ENCOUNTER — Encounter (HOSPITAL_BASED_OUTPATIENT_CLINIC_OR_DEPARTMENT_OTHER): Payer: Self-pay | Admitting: Orthopaedic Surgery

## 2023-11-02 ENCOUNTER — Other Ambulatory Visit: Payer: Self-pay

## 2023-11-02 ENCOUNTER — Encounter (HOSPITAL_BASED_OUTPATIENT_CLINIC_OR_DEPARTMENT_OTHER): Payer: Self-pay | Admitting: Orthopaedic Surgery

## 2023-11-02 NOTE — Progress Notes (Signed)
   11/02/23 1222  PAT Phone Screen  Is the patient taking a GLP-1 receptor agonist? No  Do You Have Diabetes? No  Do You Have Hypertension? (S)  Yes  Have You Ever Been to the ER for Asthma? No  Have You Taken Oral Steroids in the Past 3 Months? No  Do you Take Phenteramine or any Other Diet Drugs? No  Recent  Lab Work, EKG, CXR? No  Do you have a history of heart problems? No  Have you ever had tests on your heart? (S)  Yes  What cardiac tests were performed? (S)  Echo;Other (comment)  What date/year were cardiac tests completed? (S)  09/01/2019 Normal Holter study. Echo EF 55-60% 08/11/2019  Results viewable: [facility] Tab  Any Recent Hospitalizations? No  Height 5' 3.5" (1.613 m)  Weight 77.1 kg  Pat Appointment Scheduled Yes ([facility] EKG)

## 2023-11-03 ENCOUNTER — Other Ambulatory Visit: Payer: Self-pay

## 2023-11-03 ENCOUNTER — Encounter (HOSPITAL_COMMUNITY)
Admission: RE | Admit: 2023-11-03 | Discharge: 2023-11-03 | Disposition: A | Discharge status: Home / Self Care | Source: Ambulatory Visit | Attending: Orthopaedic Surgery | Admitting: Orthopaedic Surgery

## 2023-11-03 DIAGNOSIS — I10 Essential (primary) hypertension: Secondary | ICD-10-CM | POA: Insufficient documentation

## 2023-11-03 DIAGNOSIS — Z01818 Encounter for other preprocedural examination: Secondary | ICD-10-CM | POA: Diagnosis not present

## 2023-11-03 DIAGNOSIS — Z01810 Encounter for preprocedural cardiovascular examination: Secondary | ICD-10-CM | POA: Diagnosis present

## 2023-11-03 DIAGNOSIS — Z01812 Encounter for preprocedural laboratory examination: Secondary | ICD-10-CM | POA: Diagnosis present

## 2023-11-03 LAB — BASIC METABOLIC PANEL
Anion gap: 8 (ref 5–15)
BUN: 16 mg/dL (ref 6–20)
CO2: 30 mmol/L (ref 22–32)
Calcium: 9.2 mg/dL (ref 8.9–10.3)
Chloride: 102 mmol/L (ref 98–111)
Creatinine, Ser: 0.89 mg/dL (ref 0.44–1.00)
GFR, Estimated: >60 mL/min (ref >60)
Glucose, Bld: 89 mg/dL (ref 70–99)
Potassium: 3.4 mmol/L — ABNORMAL LOW (ref 3.5–5.1)
Sodium: 140 mmol/L (ref 135–145)

## 2023-11-04 ENCOUNTER — Ambulatory Visit (HOSPITAL_BASED_OUTPATIENT_CLINIC_OR_DEPARTMENT_OTHER): Payer: Commercial Managed Care - PPO | Attending: Orthopaedic Surgery | Admitting: Physical Therapy

## 2023-11-04 ENCOUNTER — Ambulatory Visit (HOSPITAL_BASED_OUTPATIENT_CLINIC_OR_DEPARTMENT_OTHER): Payer: Commercial Managed Care - PPO | Admitting: Orthopaedic Surgery

## 2023-11-04 ENCOUNTER — Encounter (HOSPITAL_BASED_OUTPATIENT_CLINIC_OR_DEPARTMENT_OTHER): Payer: Self-pay | Admitting: Physical Therapy

## 2023-11-04 DIAGNOSIS — S73192D Other sprain of left hip, subsequent encounter: Secondary | ICD-10-CM

## 2023-11-04 DIAGNOSIS — M6281 Muscle weakness (generalized): Secondary | ICD-10-CM | POA: Insufficient documentation

## 2023-11-04 DIAGNOSIS — R2689 Other abnormalities of gait and mobility: Secondary | ICD-10-CM | POA: Insufficient documentation

## 2023-11-04 DIAGNOSIS — R262 Difficulty in walking, not elsewhere classified: Secondary | ICD-10-CM | POA: Insufficient documentation

## 2023-11-04 DIAGNOSIS — M25552 Pain in left hip: Secondary | ICD-10-CM | POA: Diagnosis present

## 2023-11-04 NOTE — H&P (View-Only) (Signed)
 Chief Complaint: Left hip pain     History of Present Illness:   11/04/2023: Presents today for further follow-up per my request to further take a look at her hamstring origin as she is still experiencing pain down the posterior aspect of the thigh.  [name] is a 48 y.o. female presents today with ongoing left hip pain which is C-shaped in nature but does radiate to the posterior hamstring.  She has had this since September when she was helping to lift her mother-in-law and since that time felt like she had to strain that has not resolved.  She has subsequently seen Dr. Denyse Amass as well as Dr. Magnus Ivan.  She has had 2 injections now with any relief.  She is having a hard time from going from sit to stand.  Has a hard time with prolonged working.  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  She is taking Tylenol as well as tizanidine without any relief.  She is having a very hard time staying active with her hip pain.  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   Painful orthopaedic hardware [facility]) 03/2013   right foot   Past Surgical History:  Procedure Laterality Date   CALCANEAL OSTEOTOMY Right 12/23/2012   Procedure: RIGHT CALCANEAL OSTEOTOMY, RIGHT LATERAL COLUMN LENGTHENING, RIGHT COTTON OSTEOTOMY, RIGHT FLEX DIGITORIUM LONGUS TRANSFER, RIGHT KIDNER PROCEDURE ;  Surgeon: Toni Arthurs, MD;  Location: [facility];  Service: Orthopedics;  Laterality: Right;   CESAREAN SECTION  07/13/2005; 06/03/2006   DILATION AND EVACUATION  08/16/2008   FOOT SURGERY Left 2002   FOOT SURGERY Right 10/2018    GASTROCNEMIUS RECESSION Right 12/23/2012   Procedure: RIGHT GASTRO RECESSION ;  Surgeon: Toni Arthurs, MD;  Location: [facility];  Service: Orthopedics;  Laterality: Right;   HARDWARE REMOVAL Right 03/24/2013   Procedure: HARDWARE REMOVAL RIGHT HEEL;  Surgeon: Toni Arthurs, MD;  Location: [facility];  Service: Orthopedics;  Laterality: Right;   INTRAUTERINE DEVICE INSERTION     WISDOM TOOTH EXTRACTION     Social History   Socioeconomic History   Marital status: Married    Spouse name: Not on file   Number of children: 3   Years of education: Not on file   Highest education level: Bachelor's degree (e.g., BA, AB, BS)  Occupational History   Occupation: Teacher, adult education: [employer]  Tobacco Use   Smoking status: Never   Smokeless tobacco: Never  Vaping Use   Vaping status: Never Used  Substance and Sexual Activity   Alcohol use: No   Drug use: No   Sexual activity: Yes    Birth control/protection: I.U.D.  Other Topics Concern   Not on file  Social History Narrative   Not on file   Social Drivers of Health   Financial Resource Strain: Low Risk  (06/16/2023)   Overall Financial Resource Strain (CARDIA)    Difficulty of Paying Living Expenses: Not very hard  Food Insecurity: No Food Insecurity (06/16/2023)   Hunger  Vital Sign    Worried About Programme researcher, broadcasting/film/video in the Last Year: Never true    Ran Out of Food in the Last Year: Never true  Transportation Needs: No Transportation Needs (06/16/2023)   PRAPARE - Administrator, Civil Service (Medical): No    Lack of Transportation (Non-Medical): No  Physical Activity: Unknown (06/16/2023)   Exercise Vital Sign    Days of Exercise per Week: 0 days    Minutes of Exercise per Session: Not on file  Stress: Stress Concern Present (06/16/2023)   [employer] of Occupational Health - Occupational Stress Questionnaire    Feeling of Stress : Very much  Social Connections: Socially Integrated  (06/16/2023)   Social Connection and Isolation Panel [NHANES]    Frequency of Communication with Friends and Family: More than three times a week    Frequency of Social Gatherings with Friends and Family: Once a week    Attends Religious Services: More than 4 times per year    Active Member of [employer] or Organizations: Yes    Attends Engineer, structural: More than 4 times per year    Marital Status: Married   Family History  Problem Relation Age of Onset   Hypertension Mother    Hypertension Father    Diabetes Father    Irritable bowel syndrome Father    Arrhythmia Father    Breast cancer Paternal Grandmother    Colon cancer Cousin        maternal   Rectal cancer Neg Hx    Esophageal cancer Neg Hx    Stomach cancer Neg Hx    Allergies  Allergen Reactions   Augmentin [Amoxicillin-Pot Clavulanate] Nausea And Vomiting   Biotin Other (See Comments)    Severe yeast infection   Sulfa Antibiotics Other (See Comments)    Reaction:  Muscle pain    Current Outpatient Medications  Medication Sig Dispense Refill   acetaminophen (TYLENOL) 500 MG tablet Take 1,000 mg by mouth every 6 (six) hours as needed.     amLODipine (NORVASC) 10 MG tablet Take 1 tablet (10 mg total) by mouth daily. 90 tablet 1   aspirin-acetaminophen-caffeine (EXCEDRIN MIGRAINE) 250-250-65 MG tablet Take 1 tablet by mouth every 6 (six) hours as needed for headache.     celecoxib (CELEBREX) 200 MG capsule Take 1 capsule (200 mg total) by mouth 2 (two) times daily as needed. 60 capsule 5   chlorthalidone (HYGROTON) 25 MG tablet Take 1 tablet (25 mg total) by mouth daily. 90 tablet 1   gabapentin (NEURONTIN) 300 MG capsule Take 1 capsule (300 mg total) by mouth 3 (three) times daily as needed. 90 capsule 3   HYDROcodone-acetaminophen (NORCO/VICODIN) 5-325 MG tablet Take 1 tablet by mouth every 6 (six) hours as needed. (Patient not taking: Reported on 11/02/2023) 15 tablet 0   levocetirizine (XYZAL) 5 MG tablet Take  1 tablet (5 mg total) by mouth every evening. 90 tablet 1   levonorgestrel (MIRENA) 20 MCG/DAY IUD 1 each by Intrauterine route once.     montelukast (SINGULAIR) 10 MG tablet Take 1 tablet (10 mg total) by mouth at bedtime. 90 tablet 1   olmesartan (BENICAR) 20 MG tablet Take 0.5 tablets (10 mg total) by mouth daily. 90 tablet 1   tiZANidine (ZANAFLEX) 2 MG tablet Take 1-2 tablets (2-4 mg total) by mouth every 8 (eight) hours as needed. 60 tablet 1   No current facility-administered medications for this visit.   No results found.  Review of Systems:   A ROS was performed including pertinent positives and negatives as documented in the HPI.  Physical Exam :   Constitutional: NAD and appears stated age Neurological: Alert and oriented Psych: Appropriate affect and cooperative There were no vitals taken for this visit.   Comprehensive Musculoskeletal Exam:    Inspection Right Left  Skin No atrophy or gross abnormalities appreciated No atrophy or gross abnormalities appreciated  Palpation    Tenderness None None  Crepitus None None  Range of Motion    Flexion (passive) 120 120  Extension 30 30  IR 30 30 with pain  ER 45 45 with pain  Strength    Flexion  5/5 5/5  Extension 5/5 5/5  Special Tests    FABER Negative Negative  FADIR Negative Positive  ER Lag/Capsular Insufficiency Negative Negative  Instability Negative Negative  Sacroiliac pain Negative  Negative   Instability    Generalized Laxity No No  Neurologic    sciatic, femoral, obturator nerves intact to light sensation  Vascular/Lymphatic    DP pulse 2+ 2+  Lumbar Exam    Patient has symmetric lumbar range of motion with negative pain referral to hip      Imaging:   Xray (3 views left hip): Normal  MRI (left hip): Anterior superior labral tear   I personally reviewed and interpreted the radiographs.   Assessment and Plan:   48 y.o. female with left hip pain consistent with a left hip labral tear.  At  today's visit I did discuss that ultimately I do believe this is responsible for the majority of her symptoms.  She has been trialing strengthening as well as 2 injections and anti-inflammatories.  She has not gotten relief from any of this.  I would like to enroll her in formal physical therapy at this time.  I did discuss the possibility of left hip arthroscopy with labral repair given her duration of symptoms.  She would like to further consider this.  Discussed patient's as well as the associated rehab time.  After discussion of all of this she is likely plan for left hip arthroscopy with labral repair.  Today I did also have a longer discussion about the hamstring origin.  She is not tender over her ischial tuberosity the majority of her symptoms are mostly muscular in nature.  Given this I did discuss the possibility of physical therapy prior to surgical treatment of the labrum.  I did discuss that ultimately I do believe that needling and mobilization of her hamstrings would help her out.  I do not believe this would necessarily change or improve her groin pain.  After discussion of all of these she is elected to proceed first with the hip arthroscopic labral repair  -Plan for left hip arthroscopy with labral repair   After a lengthy discussion of treatment options, including risks, benefits, alternatives, complications of surgical and nonsurgical conservative options, the patient elected surgical repair.   The patient  is aware of the material risks  and complications including, but not limited to injury to adjacent structures, neurovascular injury, infection, numbness, bleeding, implant failure, thermal burns, stiffness, persistent pain, failure to heal, disease transmission from allograft, need for further surgery, dislocation, anesthetic risks, blood clots, risks of death,and others. The probabilities of surgical success and failure discussed with patient given their particular co-morbidities.The  time and nature of expected rehabilitation and recovery was discussed.The patient's questions were all answered preoperatively.  No barriers to understanding were noted.  I explained the natural history of the disease process and Rx rationale.  I explained to the patient what I considered to be reasonable expectations given their personal situation.  The final treatment plan was arrived at through a shared patient decision making process model.    I personally saw and evaluated the patient, and participated in the management and treatment plan.  Huel Cote, MD Attending Physician, Orthopedic Surgery  This document was dictated using Dragon voice recognition software. A reasonable attempt at proof reading has been made to minimize errors.

## 2023-11-04 NOTE — Therapy (Signed)
 OUTPATIENT PHYSICAL THERAPY LOWER EXTREMITY EVALUATION   Patient Name: [name] MRN: [id number] DOB:03-07-1976, 48 y.o., female Today's Date: 11/04/2023  END OF SESSION:  PT End of Session - 11/04/23 1421     Visit Number 2    Number of Visits 5    Date for PT Re-Evaluation 11/30/23    Authorization Type [facility] [employer]    PT Start Time 0715    PT Stop Time 0758    PT Time Calculation (min) 43 min    Activity Tolerance Patient tolerated treatment well;Patient limited by pain    Behavior During Therapy WFL for tasks assessed/performed              Past Medical History:  Diagnosis Date   Arthritis    right foot   Asthma    Congenital flat foot    has had bilateral foot surgery to correct   Frequent headaches    GERD (gastroesophageal reflux disease)    Hypertension    Irritable bowel syndrome (IBS)    no current med.   Painful orthopaedic hardware [facility]) 03/2013   right foot   Past Surgical History:  Procedure Laterality Date   CALCANEAL OSTEOTOMY Right 12/23/2012   Procedure: RIGHT CALCANEAL OSTEOTOMY, RIGHT LATERAL COLUMN LENGTHENING, RIGHT COTTON OSTEOTOMY, RIGHT FLEX DIGITORIUM LONGUS TRANSFER, RIGHT KIDNER PROCEDURE ;  Surgeon: Toni Arthurs, MD;  Location: [facility];  Service: Orthopedics;  Laterality: Right;   CESAREAN SECTION  07/13/2005; 06/03/2006   DILATION AND EVACUATION  08/16/2008   FOOT SURGERY Left 2002   FOOT SURGERY Right 10/2018   GASTROCNEMIUS RECESSION Right 12/23/2012   Procedure: RIGHT GASTRO RECESSION ;  Surgeon: Toni Arthurs, MD;  Location: [facility];  Service: Orthopedics;  Laterality: Right;   HARDWARE REMOVAL Right 03/24/2013   Procedure: HARDWARE REMOVAL RIGHT HEEL;  Surgeon: Toni Arthurs, MD;  Location: [facility];  Service: Orthopedics;  Laterality: Right;   INTRAUTERINE DEVICE INSERTION     WISDOM TOOTH EXTRACTION     Patient Active Problem List   Diagnosis Date Noted   Vitamin D deficiency  03/31/2022   Chronic cough 09/27/2018   Cervical spondylosis 07/11/2018   Lymphocytic colitis 05/21/2018   Anxiety state 04/29/2016   Atypical chest pain 02/22/2016   Dysphagia, pharyngoesophageal phase 02/22/2016   Physical exam 12/15/2014   Left hip pain 09/29/2014   Radicular low back pain 09/11/2014   Bilateral hip bursitis 09/11/2014   BPV (benign positional vertigo) 02/22/2014   HTN (hypertension) 02/22/2014    PCP: Karie Georges, MD   REFERRING PROVIDER:  Huel Cote, MD     REFERRING DIAG:  [phone] (ICD-10-CM) - Tear of left acetabular labrum, subsequent encounter      THERAPY DIAG:  Difficulty walking  Muscle weakness (generalized)  Left hip pain  Rationale for Evaluation and Treatment: Rehabilitation  ONSET DATE: Sept 2024  SUBJECTIVE:   SUBJECTIVE STATEMENT: Pt reports she thinks pain started with helping with transferring/caring for MIL at home. Denies specific traumatic MOI. Pt denies relief of pain with injections. Pt does have history of L sided back pain, occurred at the same. Pt does have extensive history of bilateral foot/ankle surgeries. Pt was using the gym prior to pain onset-  was walking, no resistance. Would like to consider TPDN for pain management prior to surgery.   PERTINENT HISTORY: Bilat foot surgery for arch height with hardware removal PAIN:  Are you having pain? Yes: NPRS scale: 5/10 Pain location: posterior  hip and HS  Pain description: aching Aggravating factors: bending, stairs, sitting, standing, transfers, getting in and out of cars, rolling over in bed, sleeping on either side  Relieving factors: meds, resting with leg straight   PRECAUTIONS: None  RED FLAGS: None   WEIGHT BEARING RESTRICTIONS: No  FALLS:  Has patient fallen in last 6 months? 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  DIAGNOSTIC FINDINGS:   IMPRESSION: 1. 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 No chondral abnormality. 2. Osseous prominence of the right superior anterior femoral head-neck junction. 3. Moderate tendinosis of the right gluteus minimus tendon insertion with mild peritendinous edema. Mild tendinosis of the right gluteus medius tendon insertion.     Electronically Signed   By: Elige Ko M.D.   On: 08/18/2023 15:01  PATIENT SURVEYS:  LEFS Lower Extremity Functional Score: 24 / 80 = 30.0 %  COGNITION: Overall cognitive status: Within functional limits for tasks assessed     SENSATION: WFL  MUSCLE LENGTH: Positive supine HS 90/90 Positive Thomas  POSTURE: weight shift right and R SB  PALPATION: Hypertonicity of L lumbar paraspinals and QL, R hip rotators; TTP in region of quadratus femoris  L lumbar tightness with SB and rotation   LOWER EXTREMITY ROM: WFL for tasks assessed on R  L ROM limited by pain: flexion 100, ext 0, ER full but painful at end range, IR full but painful at end range, Abd 30  LOWER EXTREMITY MMT:  MMT Right eval Left Eval  Painful throughout  Hip flexion 4+/5 4+/5  Hip extension 4+/5 4+/5  Hip abduction 4+/5 4+/5  Hip adduction 4+/5 4+/5  Hip internal rotation    Hip external rotation 4+/5 4+/5  Knee flexion    Knee extension    Ankle dorsiflexion    Ankle plantarflexion    Ankle inversion    Ankle eversion     (Blank rows = not tested)  LOWER EXTREMITY SPECIAL TESTS:  Hip special tests: Luisa Hart (FABER) test: positive , Hip scouring test: positive , and leg roll: positive  FUNCTIONAL TESTS:  STS transfer: requires  UE, offweight of L LE  GAIT: Distance walked: 38ft Assistive device utilized: None Level of assistance: Complete Independence Comments: mildly antalgic, increase lumbar lordosis                                                                                                                                TREATMENT DATE: 2/17 Manual: palpated for trigger points. None noted    There-ex: LTR x15  Hamstring stretch 3x20  sec hold seated  LAQ 2x10 red band   Reviewed final HEP prior to surgery   Nuro-Re-ed  Standing heel raise 2x15  Supine march 2x12  Supine birdge 2x12      Eval Exercises - Hooklying Single Knee to Chest Stretch  - 1-2 x daily - 7 x weekly - 1 sets - 10 reps - 5 hold - Modified Thomas Stretch  - 1-2 x daily - 7 x weekly - 1 sets - 3 reps - 30 hold - Bridge with Posterior Pelvic Tilt  - 1-2 x daily - 7 x weekly - 2 sets - 10 reps - Supine March with Posterior Pelvic Tilt  - 1-2 x daily - 7 x weekly - 1 sets - 20 reps - Seated Quadratus Lumborum Stretch in Chair  - 1-2 x daily - 7 x weekly - 1 sets - 3 reps - 30 hold    PATIENT EDUCATION:  Education details: MOI, diagnosis, prognosis, anatomy, post op expectations, exercise progression, DOMS expectations, muscle firing,  envelope of function, HEP, POC  Person educated: Patient Education method: Explanation, Demonstration, Tactile cues, Verbal cues, and Handouts Education comprehension: verbalized understanding, returned demonstration, verbal cues required, tactile cues required, and needs further education  HOME EXERCISE PROGRAM:  Access Code: [email or web address] URL: [email or web address] Date: 10/19/2023 Prepared by: Zebedee Iba  ASSESSMENT:  CLINICAL IMPRESSION: The patient came in interested in [facility]. She did not appear to have any trigger points today. She reports her pain is deep in the hip. She was advised that needling was not indicated. We reviewed how to use her HEP at home leading up until  the surgery. She was advised to move, ut not to get her self irritated. She tolerated light there-ex well. She will have surgery on 3/10     Eval: Patient is a 48 y.o. female who was seen today for physical therapy evaluation and treatment for c/c of L hip and groin pain. 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 Pt's pain is highly sensitive and irritable with movement. Pt's is strength ROM limited at this time due to pain increase with movement. Plan to continue with TPDN for pain management and strengthening of the hip as tolerated at future sessions. Consider heavy isometric hip ABD holds at next for HEP in addition to manual and joint mobilizations to improve lumbopelvic mechanics. Pt is schedule for surgical labral repair on 11/09/2023. Pt would benefit from continued skilled therapy in order to reach goals and maximize functional L hip strength and ROM prior to surgical intervention.   OBJECTIVE IMPAIRMENTS: Abnormal gait, decreased activity tolerance, decreased endurance, decreased mobility, difficulty walking, decreased ROM, decreased strength, hypomobility, increased muscle spasms, impaired flexibility, improper body mechanics, postural dysfunction, and pain.   ACTIVITY LIMITATIONS: carrying, lifting, bending, sitting, standing, squatting, sleeping, stairs, transfers, dressing, and locomotion level  PARTICIPATION LIMITATIONS: cleaning, laundry, interpersonal relationship, driving, shopping, community activity, occupation, and yard work  PERSONAL FACTORS: Fitness, Past/current experiences, Time since onset of injury/illness/exacerbation, and 1-2 comorbidities:    are also affecting patient's functional outcome.   REHAB POTENTIAL: Good  CLINICAL DECISION MAKING: Evolving/moderate complexity  EVALUATION COMPLEXITY: Moderate   GOALS:   SHORT TERM GOALS: Target date: 11/30/2023  Pt will become independent with HEP in order to demonstrate  synthesis of PT education for pre-operative pain management.  Baseline: Goal status: INITIAL  2.  Pt will report at least 2 pt reduction on NPRS scale for pain in order to demonstrate functional  improvement with household activity, self care, and ADL.  Baseline:  Goal status: INITIAL  3.  Pt will be able to demonstrate/report ability to sit/stand > 10 mins without increase in pain in order to demonstrate functional improvement and tolerance to static positioning.  Baseline:  Goal status: INITIAL  LONG TERM GOALS: to be set post -op   PLAN:  PT FREQUENCY: 1x/week  PT DURATION: 12 weeks (end at 4-6 for pre-op)  PLANNED INTERVENTIONS: 97164- PT Re-evaluation, 97110-Therapeutic exercises, 97530- Therapeutic activity, 97112- Neuromuscular re-education, 97535- Self Care, 19147- Manual therapy, [phone]- Gait training, [phone]- Orthotic Fit/training, [phone]- Aquatic Therapy, [phone]- Splinting, [phone]- Electrical stimulation (unattended), [phone]- Electrical stimulation (manual), [id number]- Vasopneumatic device, [id number]- Ultrasound, [id number]- Traction (mechanical), [id number]- Ionotophoresis 4mg /ml Dexamethasone, Patient/Family education, Balance training, Stair training, Taping, Dry Needling, Joint mobilization, Joint manipulation, Spinal manipulation, Spinal mobilization, Scar mobilization, Compression bandaging, DME instructions, Cryotherapy, Moist heat, and Biofeedback  PLAN FOR NEXT SESSION: TPDN for pain management PRN, recheck HEP for pre-op ROM and strengthening, progress as tolerated    Dessie Coma, PT 11/04/2023, 2:30 PM

## 2023-11-04 NOTE — Progress Notes (Signed)
 Chief Complaint: Left hip pain     History of Present Illness:   11/04/2023: Presents today for further follow-up per my request to further take a look at her hamstring origin as she is still experiencing pain down the posterior aspect of the thigh.  [name] is a 48 y.o. female presents today with ongoing left hip pain which is C-shaped in nature but does radiate to the posterior hamstring.  She has had this since September when she was helping to lift her mother-in-law and since that time felt like she had to strain that has not resolved.  She has subsequently seen Dr. Denyse Amass as well as Dr. Magnus Ivan.  She has had 2 injections now with any relief.  She is having a hard time from going from sit to stand.  Has a hard time with prolonged working.  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  She is taking Tylenol as well as tizanidine without any relief.  She is having a very hard time staying active with her hip pain.  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   Painful orthopaedic hardware [facility]) 03/2013   right foot   Past Surgical History:  Procedure Laterality Date   CALCANEAL OSTEOTOMY Right 12/23/2012   Procedure: RIGHT CALCANEAL OSTEOTOMY, RIGHT LATERAL COLUMN LENGTHENING, RIGHT COTTON OSTEOTOMY, RIGHT FLEX DIGITORIUM LONGUS TRANSFER, RIGHT KIDNER PROCEDURE ;  Surgeon: Toni Arthurs, MD;  Location: [facility];  Service: Orthopedics;  Laterality: Right;   CESAREAN SECTION  07/13/2005; 06/03/2006   DILATION AND EVACUATION  08/16/2008   FOOT SURGERY Left 2002   FOOT SURGERY Right 10/2018    GASTROCNEMIUS RECESSION Right 12/23/2012   Procedure: RIGHT GASTRO RECESSION ;  Surgeon: Toni Arthurs, MD;  Location: [facility];  Service: Orthopedics;  Laterality: Right;   HARDWARE REMOVAL Right 03/24/2013   Procedure: HARDWARE REMOVAL RIGHT HEEL;  Surgeon: Toni Arthurs, MD;  Location: [facility];  Service: Orthopedics;  Laterality: Right;   INTRAUTERINE DEVICE INSERTION     WISDOM TOOTH EXTRACTION     Social History   Socioeconomic History   Marital status: Married    Spouse name: Not on file   Number of children: 3   Years of education: Not on file   Highest education level: Bachelor's degree (e.g., BA, AB, BS)  Occupational History   Occupation: Teacher, adult education: [employer]  Tobacco Use   Smoking status: Never   Smokeless tobacco: Never  Vaping Use   Vaping status: Never Used  Substance and Sexual Activity   Alcohol use: No   Drug use: No   Sexual activity: Yes    Birth control/protection: I.U.D.  Other Topics Concern   Not on file  Social History Narrative   Not on file   Social Drivers of Health   Financial Resource Strain: Low Risk  (06/16/2023)   Overall Financial Resource Strain (CARDIA)    Difficulty of Paying Living Expenses: Not very hard  Food Insecurity: No Food Insecurity (06/16/2023)   Hunger  Vital Sign    Worried About Programme researcher, broadcasting/film/video in the Last Year: Never true    Ran Out of Food in the Last Year: Never true  Transportation Needs: No Transportation Needs (06/16/2023)   PRAPARE - Administrator, Civil Service (Medical): No    Lack of Transportation (Non-Medical): No  Physical Activity: Unknown (06/16/2023)   Exercise Vital Sign    Days of Exercise per Week: 0 days    Minutes of Exercise per Session: Not on file  Stress: Stress Concern Present (06/16/2023)   [employer] of Occupational Health - Occupational Stress Questionnaire    Feeling of Stress : Very much  Social Connections: Socially Integrated  (06/16/2023)   Social Connection and Isolation Panel [NHANES]    Frequency of Communication with Friends and Family: More than three times a week    Frequency of Social Gatherings with Friends and Family: Once a week    Attends Religious Services: More than 4 times per year    Active Member of [employer] or Organizations: Yes    Attends Engineer, structural: More than 4 times per year    Marital Status: Married   Family History  Problem Relation Age of Onset   Hypertension Mother    Hypertension Father    Diabetes Father    Irritable bowel syndrome Father    Arrhythmia Father    Breast cancer Paternal Grandmother    Colon cancer Cousin        maternal   Rectal cancer Neg Hx    Esophageal cancer Neg Hx    Stomach cancer Neg Hx    Allergies  Allergen Reactions   Augmentin [Amoxicillin-Pot Clavulanate] Nausea And Vomiting   Biotin Other (See Comments)    Severe yeast infection   Sulfa Antibiotics Other (See Comments)    Reaction:  Muscle pain    Current Outpatient Medications  Medication Sig Dispense Refill   acetaminophen (TYLENOL) 500 MG tablet Take 1,000 mg by mouth every 6 (six) hours as needed.     amLODipine (NORVASC) 10 MG tablet Take 1 tablet (10 mg total) by mouth daily. 90 tablet 1   aspirin-acetaminophen-caffeine (EXCEDRIN MIGRAINE) 250-250-65 MG tablet Take 1 tablet by mouth every 6 (six) hours as needed for headache.     celecoxib (CELEBREX) 200 MG capsule Take 1 capsule (200 mg total) by mouth 2 (two) times daily as needed. 60 capsule 5   chlorthalidone (HYGROTON) 25 MG tablet Take 1 tablet (25 mg total) by mouth daily. 90 tablet 1   gabapentin (NEURONTIN) 300 MG capsule Take 1 capsule (300 mg total) by mouth 3 (three) times daily as needed. 90 capsule 3   HYDROcodone-acetaminophen (NORCO/VICODIN) 5-325 MG tablet Take 1 tablet by mouth every 6 (six) hours as needed. (Patient not taking: Reported on 11/02/2023) 15 tablet 0   levocetirizine (XYZAL) 5 MG tablet Take  1 tablet (5 mg total) by mouth every evening. 90 tablet 1   levonorgestrel (MIRENA) 20 MCG/DAY IUD 1 each by Intrauterine route once.     montelukast (SINGULAIR) 10 MG tablet Take 1 tablet (10 mg total) by mouth at bedtime. 90 tablet 1   olmesartan (BENICAR) 20 MG tablet Take 0.5 tablets (10 mg total) by mouth daily. 90 tablet 1   tiZANidine (ZANAFLEX) 2 MG tablet Take 1-2 tablets (2-4 mg total) by mouth every 8 (eight) hours as needed. 60 tablet 1   No current facility-administered medications for this visit.   No results found.  Review of Systems:   A ROS was performed including pertinent positives and negatives as documented in the HPI.  Physical Exam :   Constitutional: NAD and appears stated age Neurological: Alert and oriented Psych: Appropriate affect and cooperative There were no vitals taken for this visit.   Comprehensive Musculoskeletal Exam:    Inspection Right Left  Skin No atrophy or gross abnormalities appreciated No atrophy or gross abnormalities appreciated  Palpation    Tenderness None None  Crepitus None None  Range of Motion    Flexion (passive) 120 120  Extension 30 30  IR 30 30 with pain  ER 45 45 with pain  Strength    Flexion  5/5 5/5  Extension 5/5 5/5  Special Tests    FABER Negative Negative  FADIR Negative Positive  ER Lag/Capsular Insufficiency Negative Negative  Instability Negative Negative  Sacroiliac pain Negative  Negative   Instability    Generalized Laxity No No  Neurologic    sciatic, femoral, obturator nerves intact to light sensation  Vascular/Lymphatic    DP pulse 2+ 2+  Lumbar Exam    Patient has symmetric lumbar range of motion with negative pain referral to hip      Imaging:   Xray (3 views left hip): Normal  MRI (left hip): Anterior superior labral tear   I personally reviewed and interpreted the radiographs.   Assessment and Plan:   48 y.o. female with left hip pain consistent with a left hip labral tear.  At  today's visit I did discuss that ultimately I do believe this is responsible for the majority of her symptoms.  She has been trialing strengthening as well as 2 injections and anti-inflammatories.  She has not gotten relief from any of this.  I would like to enroll her in formal physical therapy at this time.  I did discuss the possibility of left hip arthroscopy with labral repair given her duration of symptoms.  She would like to further consider this.  Discussed patient's as well as the associated rehab time.  After discussion of all of this she is likely plan for left hip arthroscopy with labral repair.  Today I did also have a longer discussion about the hamstring origin.  She is not tender over her ischial tuberosity the majority of her symptoms are mostly muscular in nature.  Given this I did discuss the possibility of physical therapy prior to surgical treatment of the labrum.  I did discuss that ultimately I do believe that needling and mobilization of her hamstrings would help her out.  I do not believe this would necessarily change or improve her groin pain.  After discussion of all of these she is elected to proceed first with the hip arthroscopic labral repair  -Plan for left hip arthroscopy with labral repair   After a lengthy discussion of treatment options, including risks, benefits, alternatives, complications of surgical and nonsurgical conservative options, the patient elected surgical repair.   The patient  is aware of the material risks  and complications including, but not limited to injury to adjacent structures, neurovascular injury, infection, numbness, bleeding, implant failure, thermal burns, stiffness, persistent pain, failure to heal, disease transmission from allograft, need for further surgery, dislocation, anesthetic risks, blood clots, risks of death,and others. The probabilities of surgical success and failure discussed with patient given their particular co-morbidities.The  time and nature of expected rehabilitation and recovery was discussed.The patient's questions were all answered preoperatively.  No barriers to understanding were noted.  I explained the natural history of the disease process and Rx rationale.  I explained to the patient what I considered to be reasonable expectations given their personal situation.  The final treatment plan was arrived at through a shared patient decision making process model.    I personally saw and evaluated the patient, and participated in the management and treatment plan.  Huel Cote, MD Attending Physician, Orthopedic Surgery  This document was dictated using Dragon voice recognition software. A reasonable attempt at proof reading has been made to minimize errors.

## 2023-11-05 ENCOUNTER — Ambulatory Visit (HOSPITAL_BASED_OUTPATIENT_CLINIC_OR_DEPARTMENT_OTHER): Payer: Self-pay | Admitting: Orthopaedic Surgery

## 2023-11-05 DIAGNOSIS — S73192D Other sprain of left hip, subsequent encounter: Secondary | ICD-10-CM

## 2023-11-06 ENCOUNTER — Ambulatory Visit (HOSPITAL_BASED_OUTPATIENT_CLINIC_OR_DEPARTMENT_OTHER): Payer: Commercial Managed Care - PPO | Admitting: Orthopaedic Surgery

## 2023-11-09 ENCOUNTER — Other Ambulatory Visit (HOSPITAL_COMMUNITY): Payer: Self-pay

## 2023-11-09 ENCOUNTER — Encounter (HOSPITAL_BASED_OUTPATIENT_CLINIC_OR_DEPARTMENT_OTHER): Payer: Self-pay | Admitting: Orthopaedic Surgery

## 2023-11-09 ENCOUNTER — Encounter (HOSPITAL_BASED_OUTPATIENT_CLINIC_OR_DEPARTMENT_OTHER)
Admission: RE | Disposition: A | Discharge status: Home / Self Care | Payer: Self-pay | Source: Home / Self Care | Attending: Orthopaedic Surgery

## 2023-11-09 ENCOUNTER — Ambulatory Visit (HOSPITAL_COMMUNITY)

## 2023-11-09 ENCOUNTER — Other Ambulatory Visit: Payer: Self-pay

## 2023-11-09 ENCOUNTER — Telehealth: Payer: Self-pay | Admitting: Orthopaedic Surgery

## 2023-11-09 ENCOUNTER — Ambulatory Visit (HOSPITAL_BASED_OUTPATIENT_CLINIC_OR_DEPARTMENT_OTHER)
Admission: RE | Admit: 2023-11-09 | Discharge: 2023-11-09 | Disposition: A | Discharge status: Home / Self Care | Payer: Commercial Managed Care - PPO | Attending: Orthopaedic Surgery | Admitting: Orthopaedic Surgery

## 2023-11-09 ENCOUNTER — Ambulatory Visit (HOSPITAL_BASED_OUTPATIENT_CLINIC_OR_DEPARTMENT_OTHER): Admitting: Anesthesiology

## 2023-11-09 ENCOUNTER — Other Ambulatory Visit: Payer: Self-pay | Admitting: Orthopaedic Surgery

## 2023-11-09 DIAGNOSIS — S73192D Other sprain of left hip, subsequent encounter: Secondary | ICD-10-CM

## 2023-11-09 DIAGNOSIS — S73192A Other sprain of left hip, initial encounter: Secondary | ICD-10-CM | POA: Diagnosis not present

## 2023-11-09 DIAGNOSIS — Z79899 Other long term (current) drug therapy: Secondary | ICD-10-CM | POA: Insufficient documentation

## 2023-11-09 DIAGNOSIS — Z96642 Presence of left artificial hip joint: Secondary | ICD-10-CM | POA: Diagnosis not present

## 2023-11-09 DIAGNOSIS — I10 Essential (primary) hypertension: Secondary | ICD-10-CM | POA: Diagnosis not present

## 2023-11-09 DIAGNOSIS — M25352 Other instability, left hip: Secondary | ICD-10-CM | POA: Diagnosis not present

## 2023-11-09 DIAGNOSIS — Z01818 Encounter for other preprocedural examination: Secondary | ICD-10-CM

## 2023-11-09 DIAGNOSIS — X58XXXA Exposure to other specified factors, initial encounter: Secondary | ICD-10-CM | POA: Insufficient documentation

## 2023-11-09 LAB — POCT PREGNANCY, URINE: Preg Test, Ur: NEGATIVE

## 2023-11-09 SURGERY — ARTHROSCOPY, HIP, WITH LABRUM REPAIR
Anesthesia: General | Site: Hip | Laterality: Left

## 2023-11-09 MED ORDER — GABAPENTIN 300 MG PO CAPS
300.0000 mg | ORAL_CAPSULE | Freq: Once | ORAL | Status: AC
Start: 2023-11-09 — End: 2023-11-09
  Administered 2023-11-09: 300 mg via ORAL

## 2023-11-09 MED ORDER — ACETAMINOPHEN 500 MG PO TABS
1000.0000 mg | ORAL_TABLET | Freq: Once | ORAL | Status: AC
  Administered 2023-11-09: 1000 mg via ORAL

## 2023-11-09 MED ORDER — ACETAMINOPHEN 500 MG PO TABS
ORAL_TABLET | ORAL | Status: AC
  Filled 2023-11-09: qty 2

## 2023-11-09 MED ORDER — KETOROLAC TROMETHAMINE 30 MG/ML IJ SOLN
INTRAMUSCULAR | Status: DC | PRN
  Administered 2023-11-09: 30 mg via INTRAVENOUS

## 2023-11-09 MED ORDER — ASPIRIN 325 MG PO TBEC
325.0000 mg | DELAYED_RELEASE_TABLET | Freq: Every day | ORAL | 0 refills | Status: DC
  Filled 2023-11-09: qty 14, 14d supply, fill #0

## 2023-11-09 MED ORDER — LIDOCAINE 2% (20 MG/ML) 5 ML SYRINGE
INTRAMUSCULAR | Status: AC
  Filled 2023-11-09: qty 5

## 2023-11-09 MED ORDER — CEFAZOLIN SODIUM-DEXTROSE 2-4 GM/100ML-% IV SOLN
2.0000 g | INTRAVENOUS | Status: AC
  Administered 2023-11-09: 2 g via INTRAVENOUS

## 2023-11-09 MED ORDER — BUPIVACAINE HCL 0.25 % IJ SOLN
INTRAMUSCULAR | Status: DC | PRN
  Administered 2023-11-09: 20 mL

## 2023-11-09 MED ORDER — OXYCODONE HCL 5 MG PO TABS: 5.0000 mg | ORAL_TABLET | Freq: Once | ORAL | Status: DC | PRN

## 2023-11-09 MED ORDER — FENTANYL CITRATE (PF) 100 MCG/2ML IJ SOLN
INTRAMUSCULAR | Status: DC | PRN
  Administered 2023-11-09: 100 ug via INTRAVENOUS

## 2023-11-09 MED ORDER — FENTANYL CITRATE (PF) 100 MCG/2ML IJ SOLN
INTRAMUSCULAR | Status: AC
  Filled 2023-11-09: qty 2

## 2023-11-09 MED ORDER — EPHEDRINE SULFATE (PRESSORS) 50 MG/ML IJ SOLN
INTRAMUSCULAR | Status: DC | PRN
  Administered 2023-11-09: 10 mg via INTRAVENOUS

## 2023-11-09 MED ORDER — 0.9 % SODIUM CHLORIDE (POUR BTL) OPTIME
TOPICAL | Status: DC | PRN
  Administered 2023-11-09: 3000 mL

## 2023-11-09 MED ORDER — SUGAMMADEX SODIUM 200 MG/2ML IV SOLN
INTRAVENOUS | Status: DC | PRN
  Administered 2023-11-09: 200 mg via INTRAVENOUS

## 2023-11-09 MED ORDER — ACETAMINOPHEN 160 MG/5ML PO SOLN: 325.0000 mg | ORAL | Status: DC | PRN

## 2023-11-09 MED ORDER — MIDAZOLAM HCL 2 MG/2ML IJ SOLN
INTRAMUSCULAR | Status: AC
  Filled 2023-11-09: qty 2

## 2023-11-09 MED ORDER — ONDANSETRON HCL 4 MG/2ML IJ SOLN
INTRAMUSCULAR | Status: DC | PRN
  Administered 2023-11-09: 4 mg via INTRAVENOUS

## 2023-11-09 MED ORDER — ACETAMINOPHEN 325 MG PO TABS: 325.0000 mg | ORAL_TABLET | ORAL | Status: DC | PRN

## 2023-11-09 MED ORDER — ACETAMINOPHEN 500 MG PO TABS
500.0000 mg | ORAL_TABLET | Freq: Three times a day (TID) | ORAL | 0 refills | Status: AC
  Filled 2023-11-09: qty 30, 10d supply, fill #0

## 2023-11-09 MED ORDER — TRANEXAMIC ACID-NACL 1000-0.7 MG/100ML-% IV SOLN
INTRAVENOUS | Status: AC
  Filled 2023-11-09: qty 100

## 2023-11-09 MED ORDER — ONDANSETRON HCL 4 MG/2ML IJ SOLN: 4.0000 mg | Freq: Once | INTRAMUSCULAR | Status: DC | PRN

## 2023-11-09 MED ORDER — FENTANYL CITRATE (PF) 100 MCG/2ML IJ SOLN: 25.0000 ug | INTRAMUSCULAR | Status: DC | PRN

## 2023-11-09 MED ORDER — TRANEXAMIC ACID-NACL 1000-0.7 MG/100ML-% IV SOLN
1000.0000 mg | INTRAVENOUS | Status: AC
Start: 2023-11-09 — End: 2023-11-09
  Administered 2023-11-09: 1000 mg via INTRAVENOUS

## 2023-11-09 MED ORDER — BUPIVACAINE HCL (PF) 0.25 % IJ SOLN
INTRAMUSCULAR | Status: AC
  Filled 2023-11-09: qty 30

## 2023-11-09 MED ORDER — OXYCODONE HCL 5 MG PO TABS
5.0000 mg | ORAL_TABLET | ORAL | 0 refills | Status: DC | PRN
  Filled 2023-11-09: qty 10, 2d supply, fill #0

## 2023-11-09 MED ORDER — DEXAMETHASONE SODIUM PHOSPHATE 10 MG/ML IJ SOLN
INTRAMUSCULAR | Status: DC | PRN
  Administered 2023-11-09: 5 mg via INTRAVENOUS

## 2023-11-09 MED ORDER — PROPOFOL 10 MG/ML IV BOLUS
INTRAVENOUS | Status: DC | PRN
  Administered 2023-11-09: 160 mg via INTRAVENOUS

## 2023-11-09 MED ORDER — PROPOFOL 10 MG/ML IV BOLUS
INTRAVENOUS | Status: AC
  Filled 2023-11-09: qty 20

## 2023-11-09 MED ORDER — PHENYLEPHRINE 80 MCG/ML (10ML) SYRINGE FOR IV PUSH (FOR BLOOD PRESSURE SUPPORT)
PREFILLED_SYRINGE | INTRAVENOUS | Status: DC | PRN
  Administered 2023-11-09 (×6): 80 ug via INTRAVENOUS

## 2023-11-09 MED ORDER — LACTATED RINGERS IV SOLN: INTRAVENOUS | Status: DC

## 2023-11-09 MED ORDER — OXYCODONE HCL 5 MG/5ML PO SOLN: 5.0000 mg | Freq: Once | ORAL | Status: DC | PRN

## 2023-11-09 MED ORDER — MIDAZOLAM HCL 5 MG/5ML IJ SOLN
INTRAMUSCULAR | Status: DC | PRN
  Administered 2023-11-09: 2 mg via INTRAVENOUS

## 2023-11-09 MED ORDER — IBUPROFEN 800 MG PO TABS
800.0000 mg | ORAL_TABLET | Freq: Three times a day (TID) | ORAL | 0 refills | Status: AC
Start: 2023-11-09 — End: 2023-11-19
  Filled 2023-11-09: qty 30, 10d supply, fill #0

## 2023-11-09 MED ORDER — ROCURONIUM BROMIDE 10 MG/ML (PF) SYRINGE
PREFILLED_SYRINGE | INTRAVENOUS | Status: DC | PRN
  Administered 2023-11-09: 60 mg via INTRAVENOUS

## 2023-11-09 MED ORDER — GABAPENTIN 300 MG PO CAPS
ORAL_CAPSULE | ORAL | Status: AC
  Filled 2023-11-09: qty 1

## 2023-11-09 MED ORDER — MEPERIDINE HCL 25 MG/ML IJ SOLN: 6.2500 mg | INTRAMUSCULAR | Status: DC | PRN

## 2023-11-09 MED ORDER — LIDOCAINE 2% (20 MG/ML) 5 ML SYRINGE
INTRAMUSCULAR | Status: DC | PRN
  Administered 2023-11-09: 100 mg via INTRAVENOUS

## 2023-11-09 MED ORDER — CEFAZOLIN SODIUM-DEXTROSE 2-4 GM/100ML-% IV SOLN
INTRAVENOUS | Status: AC
  Filled 2023-11-09: qty 100

## 2023-11-09 MED ORDER — ROCURONIUM BROMIDE 10 MG/ML (PF) SYRINGE
PREFILLED_SYRINGE | INTRAVENOUS | Status: AC
  Filled 2023-11-09: qty 10

## 2023-11-09 SURGICAL SUPPLY — 62 items
ANCHOR SUT 1.4 FLEX (Anchor) IMPLANT
BIT DRILL FLEX NANOTACK (BIT) IMPLANT
BLADE SAMURAI STR FULL RADIUS (BLADE) IMPLANT
BLADE SURG 10 STRL SS (BLADE) IMPLANT
BLADE SURG 11 STRL SS (BLADE) ×2 IMPLANT
BLADE SURG 15 STRL LF DISP TIS (BLADE) IMPLANT
CANISTER SUCT 1200ML W/VALVE (MISCELLANEOUS) ×2 IMPLANT
CANNULA OBTURATOR FLOWPORT ST5 (CANNULA) IMPLANT
CHLORAPREP W/TINT 26 (MISCELLANEOUS) ×2 IMPLANT
CLSR STERI-STRIP ANTIMIC 1/2X4 (GAUZE/BANDAGES/DRESSINGS) IMPLANT
COOLER ICEMAN CLASSIC (MISCELLANEOUS) ×2 IMPLANT
COVER BACK TABLE 60X90IN (DRAPES) ×2 IMPLANT
COVER MAYO STAND STRL (DRAPES) ×2 IMPLANT
DERMABOND ADVANCED .7 DNX12 (GAUZE/BANDAGES/DRESSINGS) IMPLANT
DISSECTOR 4.0MM X 13CM (MISCELLANEOUS) IMPLANT
DISSECTOR 4.2MMX19CM HL (MISCELLANEOUS) ×2 IMPLANT
DRAPE C-ARM 42X72 X-RAY (DRAPES) ×2 IMPLANT
DRAPE STERI IOBAN 125X83 (DRAPES) ×2 IMPLANT
DRAPE U-SHAPE 47X51 STRL (DRAPES) ×4 IMPLANT
DRSG TEGADERM 4X4.75 (GAUZE/BANDAGES/DRESSINGS) ×6 IMPLANT
FEE RENTAL EQUIP HIP INSTR KIT (INSTRUMENTS) IMPLANT
GAUZE PAD ABD 8X10 STRL (GAUZE/BANDAGES/DRESSINGS) IMPLANT
GAUZE SPONGE 4X4 12PLY STRL (GAUZE/BANDAGES/DRESSINGS) ×2 IMPLANT
GAUZE XEROFORM 1X8 LF (GAUZE/BANDAGES/DRESSINGS) ×2 IMPLANT
GLOVE BIO SURGEON STRL SZ 6 (GLOVE) ×4 IMPLANT
GLOVE BIO SURGEON STRL SZ7.5 (GLOVE) ×4 IMPLANT
GLOVE BIOGEL PI IND STRL 6.5 (GLOVE) ×2 IMPLANT
GLOVE BIOGEL PI IND STRL 8 (GLOVE) ×2 IMPLANT
GOWN STRL REUS W/ TWL LRG LVL3 (GOWN DISPOSABLE) ×4 IMPLANT
GOWN STRL REUS W/TWL XL LVL3 (GOWN DISPOSABLE) ×2 IMPLANT
INSTRUMENT ORTHO TEXT HIP FEM (INSTRUMENTS) IMPLANT
KIT PATIENT POSITION MEDIUM (KITS) IMPLANT
KIT PORTAL ENTRY HIP ACCESS (KITS) IMPLANT
MANIFOLD NEPTUNE II (INSTRUMENTS) ×2 IMPLANT
NDL HYPO 22X1.5 SAFETY MO (MISCELLANEOUS) IMPLANT
NDL INJECTOR II CARTRIDGE (MISCELLANEOUS) IMPLANT
NDL SPNL 18GX3.5 QUINCKE PK (NEEDLE) ×2 IMPLANT
NEEDLE HYPO 22X1.5 SAFETY MO (MISCELLANEOUS) IMPLANT
NEEDLE INJECTOR II CARTRIDGE (MISCELLANEOUS) ×1 IMPLANT
NEEDLE SPNL 18GX3.5 QUINCKE PK (NEEDLE) IMPLANT
NS IRRIG 1000ML POUR BTL (IV SOLUTION) ×2 IMPLANT
PACK BASIN DAY SURGERY FS (CUSTOM PROCEDURE TRAY) ×2 IMPLANT
PAD COLD SHLDR WRAP-ON (PAD) ×2 IMPLANT
PASSER SUT 1.5D CRESCENT (INSTRUMENTS) IMPLANT
SLEEVE SCD COMPRESS KNEE MED (STOCKING) ×2 IMPLANT
SPIKE FLUID TRANSFER (MISCELLANEOUS) IMPLANT
SPONGE T-LAP 18X18 ~~LOC~~+RFID (SPONGE) IMPLANT
SUCTION TUBE FRAZIER 10FR DISP (SUCTIONS) IMPLANT
SUT ETHILON 3 0 PS 1 (SUTURE) ×2 IMPLANT
SUT FIBERWIRE #2 38 T-5 BLUE (SUTURE) IMPLANT
SUT MNCRL AB 3-0 PS2 27 (SUTURE) IMPLANT
SUT VIC AB 0 CT1 27XBRD ANBCTR (SUTURE) IMPLANT
SUT VIC AB 2-0 CT1 TAPERPNT 27 (SUTURE) IMPLANT
SUT XBRAID 1.4 BLUE 40 (SUTURE) IMPLANT
SUTURE FIBERWR #2 38 T-5 BLUE (SUTURE) IMPLANT
SYR 20ML LL LF (SYRINGE) IMPLANT
SYR 50ML LL SCALE MARK (SYRINGE) ×2 IMPLANT
TOWEL GREEN STERILE FF (TOWEL DISPOSABLE) ×4 IMPLANT
TRAY ARTHROSCOPY HIP STRE FEE (INSTRUMENTS) ×1 IMPLANT
TRAY PIVOT PORT STRE FEE (INSTRUMENTS) ×1 IMPLANT
TUBE CONNECTING 20X1/4 (TUBING) ×4 IMPLANT
TUBING ARTHROSCOPY IRRIG 16FT (MISCELLANEOUS) ×2 IMPLANT

## 2023-11-09 NOTE — Discharge Instructions (Addendum)
 Discharge Instructions    Attending Surgeon: Huel Cote, MD Office Phone Number: [phone]   Diagnosis and Procedures:    Surgeries Performed: Left hip labral repair  Discharge Plan:    Diet: Resume usual diet. Begin with light or bland foods.  Drink plenty of fluids.  Activity:  Weight bearing as tolerated left hip. You are advised to go home directly from the hospital or surgical center. Restrict your activities.  GENERAL INSTRUCTIONS: 1.  Please apply ice to your wound to help with swelling and inflammation. This will improve your comfort and your overall recovery following surgery.     2. Please call Dr. Serena Croissant office at [phone] with questions Monday-Friday during business hours. If no one answers, please leave a message and someone should get back to the patient within 24 hours. For emergencies please call 911 or proceed to the emergency room.   3. Patient to notify surgical team if experiences any of the following: Bowel/Bladder dysfunction, uncontrolled pain, nerve/muscle weakness, incision with increased drainage or redness, nausea/vomiting and Fever greater than 101.0 F.  Be alert for signs of infection including redness, streaking, odor, fever or chills. Be alert for excessive pain or bleeding and notify your surgeon immediately.  WOUND INSTRUCTIONS:   Leave your dressing, cast, or splint in place until your post operative visit.  Keep it clean and dry.  Always keep the incision clean and dry until the staples/sutures are removed. If there is no drainage from the incision you should keep it open to air. If there is drainage from the incision you must keep it covered at all times until the drainage stops  Do not soak in a bath tub, hot tub, pool, lake or other body of water until 21 days after your surgery and your incision is completely dry and healed.  If you have removable sutures (or staples) they must be removed 10-14 days (unless otherwise  instructed) from the day of your surgery.     1)  Elevate the extremity as much as possible.  2)  Keep the dressing clean and dry.  3)  Please call us if the dressing becomes wet or dirty.  4)  If you are experiencing worsening pain or worsening swelling, please call.     MEDICATIONS: Resume all previous home medications at the previous prescribed dose and frequency unless otherwise noted Start taking the  pain medications on an as-needed basis as prescribed  Please taper down pain medication over the next week following surgery.  Ideally you should not require a refill of any narcotic pain medication.  Take pain medication with food to minimize nausea. In addition to the prescribed pain medication, you may take over-the-counter pain relievers such as Tylenol.  Do NOT take additional tylenol if your pain medication already has tylenol in it.  Aspirin 325mg  daily per instructions on bottle. Narcotic policy: Per [facility] clinic policy, our goal is ensure optimal postoperative pain control with a multimodal pain management strategy. For all [facility] patients, our goal is to wean post-operative narcotic medications by 6 weeks post-operatively, and many times sooner. If this is not possible due to utilization of pain medication prior to surgery, your [facility] doctor will support your acute post-operative pain control for the first 6 weeks postoperatively, with a plan to transition you back to your primary pain team following that. Cyndia Skeeters will work to ensure a Therapist, occupational.       FOLLOWUP INSTRUCTIONS: 1. Follow up at the Physical Therapy  Clinic 3-4 days following surgery. This appointment should be scheduled unless other arrangements have been made.The Physical Therapy scheduling number is [phone] if an appointment has not already been arranged.  2. Contact Dr. Serena Croissant office during office hours at [phone] or the practice after hours line at [phone] for non-emergencies.  For medical emergencies call 911.   Discharge Location: Home   No Tylenol until after 6:45 today if needed No Ibuprofen until after 8:45 today if needed   Post Anesthesia Home Care Instructions  Activity: Get plenty of rest for the remainder of the day. A responsible individual must stay with you for 24 hours following the procedure.  For the next 24 hours, DO NOT: -Drive a car -Advertising copywriter -Drink alcoholic beverages -Take any medication unless instructed by your physician -Make any legal decisions or sign important papers.  Meals: Start with liquid foods such as gelatin or soup. Progress to regular foods as tolerated. Avoid greasy, spicy, heavy foods. If nausea and/or vomiting occur, drink only clear liquids until the nausea and/or vomiting subsides. Call your physician if vomiting continues.  Special Instructions/Symptoms: Your throat may feel dry or sore from the anesthesia or the breathing tube placed in your throat during surgery. If this causes discomfort, gargle with warm salt water. The discomfort should disappear within 24 hours.  If you had a scopolamine patch placed behind your ear for the management of post- operative nausea and/or vomiting:  1. The medication in the patch is effective for 72 hours, after which it should be removed.  Wrap patch in a tissue and discard in the trash. Wash hands thoroughly with soap and water. 2. You may remove the patch earlier than 72 hours if you experience unpleasant side effects which may include dry mouth, dizziness or visual disturbances. 3. Avoid touching the patch. Wash your hands with soap and water after contact with the patch.

## 2023-11-09 NOTE — Telephone Encounter (Signed)
 Patient had surgery today; medication has not been called in. Patient is there to pick up the meds. Please have it called in.

## 2023-11-09 NOTE — Anesthesia Procedure Notes (Signed)
 Procedure Name: Intubation Date/Time: 11/09/2023 1:51 PM  Performed by: Demetrio Lapping, CRNAPre-anesthesia Checklist: Patient identified, Emergency Drugs available, Suction available and Patient being monitored Patient Re-evaluated:Patient Re-evaluated prior to induction Oxygen Delivery Method: Circle System Utilized Preoxygenation: Pre-oxygenation with 100% oxygen Induction Type: IV induction Ventilation: Mask ventilation without difficulty Laryngoscope Size: Mac and 3 Grade View: Grade I Tube type: Oral Number of attempts: 1 Airway Equipment and Method: Stylet Placement Confirmation: ETT inserted through vocal cords under direct vision, positive ETCO2 and breath sounds checked- equal and bilateral Secured at: 22 cm Tube secured with: Tape Dental Injury: Teeth and Oropharynx as per pre-operative assessment

## 2023-11-09 NOTE — Transfer of Care (Signed)
 Immediate Anesthesia Transfer of Care Note  Patient: [name]  Procedure(s) Performed: LEFT Arthroscopy Hip with Labral Repair (Left: Hip)  Patient Location: PACU  Anesthesia Type:General  Level of Consciousness: awake and patient cooperative  Airway & Oxygen Therapy: Patient Spontanous Breathing and Patient connected to face mask oxygen  Post-op Assessment: Report given to RN and Post -op Vital signs reviewed and stable  Post vital signs: Reviewed and stable  Last Vitals:  Vitals Value Taken Time  BP 133/95 11/09/23 1502  Temp    Pulse 93 11/09/23 1505  Resp 19 11/09/23 1505  SpO2 100 % 11/09/23 1505  Vitals shown include unfiled device data.  Last Pain:  Vitals:   11/09/23 1237  TempSrc: Temporal  PainSc: 0-No pain         Complications: No notable events documented.

## 2023-11-09 NOTE — Anesthesia Preprocedure Evaluation (Addendum)
 Anesthesia Evaluation  Patient identified by MRN, date of birth, ID band Patient awake    Reviewed: Allergy & Precautions, H&P , NPO status , Patient's Chart, lab work & pertinent test results  Airway Mallampati: II  TM Distance: >3 FB Neck ROM: Full    Dental no notable dental hx. (+) Teeth Intact, Dental Advisory Given   Pulmonary    Pulmonary exam normal breath sounds clear to auscultation       Cardiovascular hypertension, Pt. on medications Normal cardiovascular exam Rhythm:Regular Rate:Normal     Neuro/Psych    GI/Hepatic ,GERD  ,,  Endo/Other    Renal/GU      Musculoskeletal   Abdominal   Peds  Hematology   Anesthesia Other Findings   Reproductive/Obstetrics                             Anesthesia Physical Anesthesia Plan  ASA: 2  Anesthesia Plan: General   Post-op Pain Management: Minimal or no pain anticipated, Tylenol PO (pre-op)* and Celebrex PO (pre-op)*   Induction: Intravenous  PONV Risk Score and Plan: 3 and Ondansetron, Dexamethasone and Treatment may vary due to age or medical condition  Airway Management Planned: LMA and Oral ETT  Additional Equipment: None  Intra-op Plan:   Post-operative Plan: Extubation in OR  Informed Consent: I have reviewed the patients History and Physical, chart, labs and discussed the procedure including the risks, benefits and alternatives for the proposed anesthesia with the patient or authorized representative who has indicated his/her understanding and acceptance.     Dental advisory given  Plan Discussed with: CRNA and Anesthesiologist  Anesthesia Plan Comments:         Anesthesia Quick Evaluation

## 2023-11-09 NOTE — Anesthesia Postprocedure Evaluation (Signed)
 Anesthesia Post Note  Patient: [name]  Procedure(s) Performed: LEFT Arthroscopy Hip with Labral Repair (Left: Hip)     Patient location during evaluation: PACU Anesthesia Type: General Level of consciousness: awake and alert Pain management: pain level controlled Vital Signs Assessment: post-procedure vital signs reviewed and stable Respiratory status: spontaneous breathing, nonlabored ventilation, respiratory function stable and patient connected to nasal cannula oxygen Cardiovascular status: blood pressure returned to baseline and stable Postop Assessment: no apparent nausea or vomiting Anesthetic complications: no   No notable events documented.  Last Vitals:  Vitals:   11/09/23 1545 11/09/23 1626  BP: 130/89 (!) 142/79  Pulse: 75 82  Resp: 16 20  Temp:  (!) 36.2 C  SpO2: 100% 100%    Last Pain:  Vitals:   11/09/23 1626  TempSrc: Temporal  PainSc: 3                  Xzaria Teo

## 2023-11-09 NOTE — Brief Op Note (Signed)
   Brief Op Note  Date of Surgery: 11/09/2023  Preoperative Diagnosis: LEFT HIP INSTABILITY  Postoperative Diagnosis: same  Procedure: Procedure(s): LEFT Arthroscopy Hip with Labral Repair  Implants: Implant Name Type Inv. Item Serial No. Manufacturer Lot No. LRB No. Used Action  Pivot NanoTack Flex Suture Anchor 1.4 mm     [id number] Left 1 Implanted  Pivot NanoTack Flex Suture Anchor 1.58mm     [id number] Left 1 Implanted    Surgeons: Surgeon(s): Huel Cote, MD  Anesthesia: General    Estimated Blood Loss: See anesthesia record  Complications: None  Condition to PACU: Stable  Benancio Deeds, MD 11/09/2023 2:46 PM

## 2023-11-09 NOTE — Interval H&P Note (Signed)
 History and Physical Interval Note:  11/09/2023 1:41 PM  [name]  has presented today for surgery, with the diagnosis of LEFT HIP INSTABILITY.  The various methods of treatment have been discussed with the patient and family. After consideration of risks, benefits and other options for treatment, the patient has consented to  Procedure(s): LEFT Arthroscopy Hip with Labral Repair (Left) as a surgical intervention.  The patient's history has been reviewed, patient examined, no change in status, stable for surgery.  I have reviewed the patient's chart and labs.  Questions were answered to the patient's satisfaction.     Huel Cote

## 2023-11-09 NOTE — Op Note (Signed)
 Date of Surgery: 11/09/2023  INDICATIONS: [name] is a 48 y.o.-year-old female with left hip labral tear.  The risk and benefits of the procedure were discussed in detail and documented in the pre-operative evaluation.   PREOPERATIVE DIAGNOSIS: 1. Left hip labral tear  POSTOPERATIVE DIAGNOSIS: Same.  PROCEDURE: 1. Left hip arthroscopy with labral repair  SURGEON: Benancio Deeds MD  ASSISTANT: Ardeen Fillers, ATC  ANESTHESIA:  general  IV FLUIDS AND URINE: See anesthesia record.  ANTIBIOTICS: Ancef  ESTIMATED BLOOD LOSS: 10 mL.  IMPLANTS:  Implant Name Type Inv. Item Serial No. Manufacturer Lot No. LRB No. Used Action  Pivot NanoTack Flex Suture Anchor 1.4 mm     [id number] Left 1 Implanted  Pivot NanoTack Flex Suture Anchor 1.31mm     [id number] Left 1 Implanted    DRAINS: None  CULTURES: None  COMPLICATIONS: none  DESCRIPTION OF PROCEDURE:  Cartilage Intact femoral and acetabular cartilage   Labrum Torn/frayed appearing   Boundaries of labral tear Convention (3 o'clock anterior, 9 o'clock posterior) Anterior boundary: 3 o'clock Posterior boundary: 1 o'clock   OPERATIVE REPORT:  The patient was brought to the operating room, placed supine on the operating table, and bony prominences were padded.  The traction boots were applied with padding to ensure that safe traction could be applied through the feet.  The contralateral limb was abducted maximally and light traction was applied.  The operative leg was brought into neutral position.  The flouroscopic c-arm was brought between the legs for an AP image.  The patient was prepped and draped in a sterile fashion.  Time-out was performed and landmarks were identified. Traction was obtained and care was taken to ensure the least amount of force necessary to allow safe access to the joint of 8-51mm.  This was checked with fluoroscopy.    Next we placed an anterolateral portal under the assistance of fluoroscopy.  First,  fluoroscopy was used to estimate the trajectory and starting point.  A 5mm incision with a #11 blade was made and a straight hemostat was used to dilate the portal through the appropriate tract.  We then placed a 14-gauge hypodermic needle with careful technique to be as close to the femoral head as possible and parallel to the sorcele to ensure no iatrogenic damage to the labrum.  This released the negative pressure environment and the amount of traction was adjusted to maintain the 8-51mm of distraction.  A nitinol wire was placed through the needle and flouroscopy was used to ensure it extended to the medial wall of the acetabulum.  The Flowport from [employer] was placed over the wire and the nitinol wire was retracted to just inside the capsule during insertion of the dilator and cannula to minimize the risk of breakage. The arthroscope was placed next and we visualized the anterior triangle.     We then placed the anterior portal under direct visualization using the technique described above.  This was safely placed as well without damage to the labrum or femoral head.  We then switched our arthroscope to the anterior portal to ensure we were not through the labrum - we were safely through the capsule only.  We then proceeded with a transverse capsulotomy connecting the 2 portals in the same plane utilizing the Samurai blade from [employer].  We identified the anterior inferior iliac spine proximally, the psoas tendon medially and the rectus tendon laterally as landmarks.  We then proceeded with a diagnostic arthroscopy -  the results can be found in the findings section above.    We then used the 50 degree hip specific radiofrequency device from [employer]. and a 4mm shaver to clear the superior acetabulum and expose the subspinous region.  Next we exposed the acetabular rim leaving the chondral labral junction intact.  Working from both portals, the acetabular rim/subspinous region was  reshaped with 5.5 mm bur consistent with the preoperative three-dimensional imaging.   When adequate reshaping was obtained we then proceeded with the labral repair. We placed 31 anchors at the 1:00 and 2:30 positions. The sutures were passed using the crescent Nanopass from [employer].  This resulted in anatomic labral repair.  We debrided the loose cartilage at the rim and residual degenerative labral tissue.  Traction was let down with total traction time of 31 minutes.     Finally, we performed a complete capsular closure with tape suture.  She was replaced in the anterior and posterior limb of the reported capsulotomy with excellent apposition. We then removed the arthroscope and closed the incisions with 3-0 nylon simple stitches.  A sterile dressing was applied..  The patient was awakened from anesthesia and transferred to PACU in stable condition. Postoperative care includes:       POSTOPERATIVE PLAN:    Weight bearing as tolerated left hip/leg Formal physical therapy will begin this week. ASA 325 Daily for DVT prophylaxis   op   Benancio Deeds, MD 2:46 PM

## 2023-11-12 ENCOUNTER — Ambulatory Visit (HOSPITAL_BASED_OUTPATIENT_CLINIC_OR_DEPARTMENT_OTHER): Payer: Commercial Managed Care - PPO | Admitting: Physical Therapy

## 2023-11-12 ENCOUNTER — Encounter (HOSPITAL_BASED_OUTPATIENT_CLINIC_OR_DEPARTMENT_OTHER): Payer: Self-pay | Admitting: Physical Therapy

## 2023-11-12 ENCOUNTER — Other Ambulatory Visit: Payer: Self-pay

## 2023-11-12 DIAGNOSIS — R262 Difficulty in walking, not elsewhere classified: Secondary | ICD-10-CM | POA: Diagnosis not present

## 2023-11-12 DIAGNOSIS — M25552 Pain in left hip: Secondary | ICD-10-CM

## 2023-11-12 DIAGNOSIS — M6281 Muscle weakness (generalized): Secondary | ICD-10-CM

## 2023-11-12 DIAGNOSIS — R2689 Other abnormalities of gait and mobility: Secondary | ICD-10-CM

## 2023-11-12 NOTE — Therapy (Signed)
 OUTPATIENT PHYSICAL THERAPY LOWER EXTREMITY EVALUATION   Patient Name: [name] MRN: [id number] DOB:July 30, 1976, 48 y.o., female Today's Date: 11/12/2023  END OF SESSION:  PT End of Session - 11/12/23 1144     Visit Number 1    Number of Visits 24    Date for PT Re-Evaluation 02/04/24    Authorization Type [facility] [employer]    PT Start Time 1145    PT Stop Time 1225    PT Time Calculation (min) 40 min    Activity Tolerance Patient tolerated treatment well             Past Medical History:  Diagnosis Date   Arthritis    right foot   Asthma    Congenital flat foot    has had bilateral foot surgery to correct   Frequent headaches    GERD (gastroesophageal reflux disease)    Hypertension    Irritable bowel syndrome (IBS)    no current med.   Painful orthopaedic hardware [facility]) 03/2013   right foot   Past Surgical History:  Procedure Laterality Date   CALCANEAL OSTEOTOMY Right 12/23/2012   Procedure: RIGHT CALCANEAL OSTEOTOMY, RIGHT LATERAL COLUMN LENGTHENING, RIGHT COTTON OSTEOTOMY, RIGHT FLEX DIGITORIUM LONGUS TRANSFER, RIGHT KIDNER PROCEDURE ;  Surgeon: Toni Arthurs, MD;  Location: [facility];  Service: Orthopedics;  Laterality: Right;   CESAREAN SECTION  07/13/2005; 06/03/2006   DILATION AND EVACUATION  08/16/2008   FOOT SURGERY Left 2002   FOOT SURGERY Right 10/2018   GASTROCNEMIUS RECESSION Right 12/23/2012   Procedure: RIGHT GASTRO RECESSION ;  Surgeon: Toni Arthurs, MD;  Location: [facility];  Service: Orthopedics;  Laterality: Right;   HARDWARE REMOVAL Right 03/24/2013   Procedure: HARDWARE REMOVAL RIGHT HEEL;  Surgeon: Toni Arthurs, MD;  Location: [facility];  Service: Orthopedics;  Laterality: Right;   INTRAUTERINE DEVICE INSERTION     WISDOM TOOTH EXTRACTION     Patient Active Problem List   Diagnosis Date Noted   Tear of left acetabular labrum 11/09/2023   Vitamin D deficiency 03/31/2022   Chronic cough 09/27/2018    Cervical spondylosis 07/11/2018   Lymphocytic colitis 05/21/2018   Anxiety state 04/29/2016   Atypical chest pain 02/22/2016   Dysphagia, pharyngoesophageal phase 02/22/2016   Physical exam 12/15/2014   Left hip pain 09/29/2014   Radicular low back pain 09/11/2014   Bilateral hip bursitis 09/11/2014   BPV (benign positional vertigo) 02/22/2014   HTN (hypertension) 02/22/2014    PCP: Karie Georges, MD  REFERRING PROVIDER: Huel Cote, MD  REFERRING DIAG: [phone] (ICD-10-CM) - Tear of left acetabular labrum, subsequent encounter  THERAPY DIAG:  Left hip pain  Muscle weakness (generalized)  Other abnormalities of gait and mobility  Rationale for Evaluation and Treatment: Rehabilitation  ONSET DATE: 11/09/23 L hip scope with labral repair  SUBJECTIVE:   SUBJECTIVE STATEMENT: Pt states since surgery she has been doing pretty well, feels pain is a bit better than she expected. Has been using [place] quite a bit. Pt states she was told she could bear weight with walker. She does mention she has had pain with lower body dressing, particularly getting shoes on/off. States she hasn't performed any exercises yet for the most part but did try to do SLR, stopped this due to pain (advised her to defer this movement given post op restrictions).  Has historically been quite active.  No distal pain, N/T. No redness/warmth.   PERTINENT HISTORY: asthma, GERD, headaches, HTN,  IBS, BPPV PAIN:  Are you having pain: none Location/description:L hip anterior/lateral Best-worst over past week: 0-5/10  - aggravating factors: movement, especially lower body dressing/hygiene - Easing factors: icing    PRECAUTIONS: hip labral repair protocol  RED FLAGS: None   WEIGHT BEARING RESTRICTIONS: Per op note WBAT LLE  FALLS:  Has patient fallen in last 6 months? No  LIVING ENVIRONMENT: 2 story home bed/bath downstairs, lives w/ spouse and kids (18 and 30). Laundry upstairs . 1STE Currently  staying with parents, staying on lower level. 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  DIAGNOSTIC FINDINGS:  DOS 11/09/23 L hip arthroscopic labral repair  PATIENT SURVEYS:  LEFS deferred  COGNITION: Overall cognitive status: Within functional limits for tasks assessed     SENSATION: Denies sensory complaints  EDEMA/INSPECTION:  Dressing appears clean and intact, no significant redness or swelling about surgical site. No apparent drainage/bleeding.    LOWER EXTREMITY ROM:  ROM Right eval Left eval  Hip flexion    Hip extension    Hip abduction    Hip adduction    Hip internal rotation    Hip external rotation    Knee flexion    Knee extension    Ankle dorsiflexion    Ankle plantarflexion    Ankle inversion    Ankle eversion     (Blank rows = not tested) (Key: WFL = within functional limits not formally assessed, * = concordant pain, s = stiffness/stretching sensation, NT = not tested) Comments: PROM only on eval; flex/ER/IR painless, maintaining ROM within surgical protocol  LOWER EXTREMITY MMT:  MMT Right eval Left eval  Hip flexion    Hip extension    Hip abduction    Hip adduction    Hip internal rotation    Hip external rotation    Knee flexion    Knee extension    Ankle dorsiflexion    Ankle plantarflexion    Ankle inversion    Ankle eversion     (Blank rows = not tested) (Key: WFL = within functional limits not formally assessed, * = concordant pain, s = stiffness/stretching sensation, NT = not tested) Comment: deferred on eval given proximity to surgery  FUNCTIONAL TESTS:  Sit to  stand: BIL UE support, reduced WB through surgical limb, inc time/effort   GAIT: Distance walked: within clinic Assistive device utilized:  standard walker Level of assistance: Modified independence Comments: step to pattern, increased distance from RW, reduced gait speed/cadence, reduced WB through surgical limb as expected  TREATMENT DATE:  [facility] Adult PT Treatment:                                                DATE: 11/12/23 Therapeutic Exercise: Glute sets, quad sets, ankle pumps, TA activation; all in supine, cues for appropriate performance/setup  Self Care: Dressing inspection/reinforcement (adhesive coming loose medially), post op restrictions, safety w/ mobility, appropriate positioning, monitoring surgical site, appropriate ADL modifications for safety/comfort   PATIENT EDUCATION:  Education details: Pt education on PT impairments, prognosis, and POC. Informed consent. Rationale for interventions, safe/appropriate HEP performance, post op restrictions and safety w/ mobility/exercise  Person educated: Patient Education method: Explanation, Demonstration, Tactile cues, Verbal cues Education comprehension: verbalized understanding, returned demonstration, verbal cues required, tactile cues required, and needs further education    HOME EXERCISE PROGRAM: Access Code: [email or web address] URL: [email or web address] Date: 11/12/2023 Prepared by: Fransisco Hertz  Exercises - Supine Gluteal Sets  - 3-4 x daily - 1 sets - 10-12 reps - Supine Quadricep Sets  - 3-4 x daily - 1 sets - 10-12 reps - Supine Ankle Pumps  - 3-4 x daily - 1 sets - 10-12 reps - Supine Transversus Abdominis Bracing - Hands on Stomach  - 3-4 x daily - 1 sets - 10-12 reps  ASSESSMENT:  CLINICAL IMPRESSION: Patient is a pleasant 48 y.o. woman who was seen today for physical therapy  evaluation and treatment for L hip arthroscopic labral repair DOS 11/09/23. Today she presents with mobility deficits as expected post op and difficulty performing usual activities. On exam she appears to be doing well post op, did require education on safety w/ mobility and post op restrictions. Dressing reinforced but overall surgical site is well appearing. No concerning symptoms today. Tolerates HEP and exam well without adverse event. Recommend skilled PT to address aforementioned deficits with aim of improving functional tolerance and reducing pain with typical activities. Pt departs today's session in no acute distress, all voiced concerns/questions addressed appropriately from PT perspective.    OBJECTIVE IMPAIRMENTS: Abnormal gait, decreased activity tolerance, decreased endurance, decreased mobility, difficulty walking, decreased ROM, decreased strength, improper body mechanics, and pain.   ACTIVITY LIMITATIONS: carrying, lifting, bending, sitting, standing, squatting, sleeping, stairs, transfers, bed mobility, and locomotion level  PARTICIPATION LIMITATIONS: meal prep, cleaning, laundry, driving, shopping, community activity, and occupation  PERSONAL FACTORS: Time since onset of injury/illness/exacerbation and 3+ comorbidities: asthma, HTN, headaches  are also affecting patient's functional outcome.   REHAB POTENTIAL: Good  CLINICAL DECISION MAKING: Stable/uncomplicated  EVALUATION COMPLEXITY: Low   GOALS:  SHORT TERM GOALS: Target date: 12/24/2023  Pt will demonstrate appropriate understanding and performance of initially prescribed HEP in order to facilitate improved independence with management of symptoms.  Baseline: HEP established  Goal status: INITIAL   2. Pt will report at least 25% improvement in overall pain levels over past week in order to facilitate improved tolerance to typical daily activities.   Baseline: 0-5/10  Goal status: INITIAL    LONG TERM GOALS: Target  date: 02/04/2024   Pt will improve MCID or greater on LEFS in order to demonstrate improved perception of function due to symptoms (MCID 9 pts) Baseline: LEFS TBD Goal status: INITIAL  2.  Pt will demonstrate L hip AROM grossly WNL in order to facilitate improved tolerance to functional movements. Baseline: see ROM chart above Goal status: INITIAL  3.  Pt will demonstrate ability to navigate one flight of stairs safely w/o UE support and mechanics WNL in order to facilitate improved home navigation. Baseline: staying on main level Goal status: INITIAL  4.  Pt will be able to perform 5 consecutive sit to stands without pain and with mechanics grossly WNL in order to facilitate improved safety w/ transfers. Baseline: altered mechanics as expected Goal status: INITIAL    5. Pt will be able to ambulate community distances without limitation and with LRAD in order to facilitate improved community navigation and tolerance to occupational tasks.  Baseline: ambulating into clinic with mild pain, standard walker  Goal status: INITIAL    PLAN:  PT FREQUENCY: 1-2x/week  PT DURATION: 12 weeks  PLANNED INTERVENTIONS: 97164- PT Re-evaluation, 97110-Therapeutic exercises, 97530- Therapeutic activity, 97112- Neuromuscular re-education, 97535- Self Care, 16109- Manual therapy, [phone]- Gait training, [phone]- Aquatic Therapy, [phone]- Electrical stimulation (unattended), Patient/Family education, Balance training, Stair training, Taping, Dry Needling, Joint mobilization, Spinal mobilization, Scar mobilization, Cryotherapy, and Moist heat  PLAN FOR NEXT SESSION: Review/update HEP PRN. Progress through hip labral repair protocol as able/appropriate. Symptom modification strategies as indicated/appropriate.    Ashley Murrain PT, DPT 11/12/2023 1:26 PM

## 2023-11-18 NOTE — Therapy (Signed)
 OUTPATIENT PHYSICAL THERAPY TREATMENT   Patient Name: [name] MRN: [id number] DOB:[birth year], 48 y.o., female Today's Date: 11/19/2023  END OF SESSION:  PT End of Session - 11/19/23 1145     Visit Number 2    Number of Visits 24    Date for PT Re-Evaluation 02/04/24    Authorization Type [facility] [employer]    PT Start Time 1145    PT Stop Time 1229    PT Time Calculation (min) 44 min    Activity Tolerance Patient tolerated treatment well              Past Medical History:  Diagnosis Date   Arthritis    right foot   Asthma    Congenital flat foot    has had bilateral foot surgery to correct   Frequent headaches    GERD (gastroesophageal reflux disease)    Hypertension    Irritable bowel syndrome (IBS)    no current med.   Painful orthopaedic hardware [facility]) 03/2013   right foot   Past Surgical History:  Procedure Laterality Date   CALCANEAL OSTEOTOMY Right 12/23/2012   Procedure: RIGHT CALCANEAL OSTEOTOMY, RIGHT LATERAL COLUMN LENGTHENING, RIGHT COTTON OSTEOTOMY, RIGHT FLEX DIGITORIUM LONGUS TRANSFER, RIGHT KIDNER PROCEDURE ;  Surgeon: Toni Arthurs, MD;  Location: [facility];  Service: Orthopedics;  Laterality: Right;   CESAREAN SECTION  07/13/2005; 06/03/2006   DILATION AND EVACUATION  08/16/2008   FOOT SURGERY Left 2002   FOOT SURGERY Right 10/2018   GASTROCNEMIUS RECESSION Right 12/23/2012   Procedure: RIGHT GASTRO RECESSION ;  Surgeon: Toni Arthurs, MD;  Location: [facility];  Service: Orthopedics;  Laterality: Right;   HARDWARE REMOVAL Right 03/24/2013   Procedure: HARDWARE REMOVAL RIGHT HEEL;  Surgeon: Toni Arthurs, MD;  Location: [facility];  Service: Orthopedics;  Laterality: Right;   INTRAUTERINE DEVICE INSERTION     WISDOM TOOTH EXTRACTION     Patient Active Problem List   Diagnosis Date Noted   Tear of left acetabular labrum 11/09/2023   Vitamin D deficiency 03/31/2022   Chronic cough 09/27/2018   Cervical  spondylosis 07/11/2018   Lymphocytic colitis 05/21/2018   Anxiety state 04/29/2016   Atypical chest pain 02/22/2016   Dysphagia, pharyngoesophageal phase 02/22/2016   Physical exam 12/15/2014   Left hip pain 09/29/2014   Radicular low back pain 09/11/2014   Bilateral hip bursitis 09/11/2014   BPV (benign positional vertigo) 02/22/2014   HTN (hypertension) 02/22/2014    PCP: Karie Georges, MD  REFERRING PROVIDER: Huel Cote, MD  REFERRING DIAG: [phone] (ICD-10-CM) - Tear of left acetabular labrum, subsequent encounter  THERAPY DIAG:  Left hip pain  Muscle weakness (generalized)  Other abnormalities of gait and mobility  Difficulty walking  Rationale for Evaluation and Treatment: Rehabilitation  ONSET DATE: 11/09/23 L hip scope with labral repair  SUBJECTIVE:  Per eval - Pt states since surgery she has been doing pretty well, feels pain is a bit better than she expected. Has been using [place] quite a bit. Pt states she was told she could bear weight with walker. She does mention she has had pain with lower body dressing, particularly getting shoes on/off. States she hasn't performed any exercises yet for the most part but did try to do SLR, stopped this due to pain (advised her to defer this movement given post op restrictions).  Has historically been quite active.  No distal pain, N/T. No redness/warmth.   SUBJECTIVE STATEMENT: 11/19/2023 reports  4/10 pain in lateral hip at present. Mostly has pain with particular movements. Doing HEP twice a day, no issues. Using walker full time. Pt is doing most of her own ADLs, states she has been having a bit more pain the past several days and says she may have been overdoing some activities.  PERTINENT HISTORY: asthma, GERD, headaches, HTN, IBS, BPPV PAIN:  Are you having pain: 4/10 lateral L hip  Per eval -  Location/description:L hip anterior/lateral Best-worst over past week: 0-5/10  - aggravating factors: movement,  especially lower body dressing/hygiene - Easing factors: icing    PRECAUTIONS: hip labral repair protocol  RED FLAGS: None   WEIGHT BEARING RESTRICTIONS: Per op note WBAT LLE  FALLS:  Has patient fallen in last 6 months? No  LIVING ENVIRONMENT: 2 story home bed/bath downstairs, lives w/ spouse and kids (18 and 18). Laundry upstairs . 1STE Currently staying with parents, staying on lower level. 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  DIAGNOSTIC FINDINGS:  DOS 11/09/23 L hip arthroscopic labral repair  PATIENT SURVEYS:  LEFS 25/80  11/19/23  COGNITION: Overall cognitive status: Within functional limits for tasks assessed     SENSATION: Denies sensory complaints  EDEMA/INSPECTION:  Dressing appears clean and intact, no significant redness or swelling about surgical site. No apparent drainage/bleeding.    LOWER EXTREMITY ROM:  ROM Right eval Left eval  Hip flexion    Hip extension    Hip abduction    Hip adduction    Hip internal rotation    Hip external rotation    Knee flexion    Knee extension    Ankle dorsiflexion    Ankle plantarflexion    Ankle inversion    Ankle eversion     (Blank rows = not tested) (Key: WFL = within functional limits not formally assessed, * = concordant pain, s = stiffness/stretching sensation, NT = not tested) Comments: PROM only on eval; flex/ER/IR painless, maintaining ROM within surgical protocol  LOWER EXTREMITY MMT:  MMT Right eval Left eval  Hip flexion    Hip extension    Hip abduction    Hip adduction    Hip internal rotation    Hip external rotation     Knee flexion    Knee extension    Ankle dorsiflexion    Ankle plantarflexion    Ankle inversion    Ankle eversion     (Blank rows = not tested) (Key: WFL = within functional limits not formally assessed, * = concordant pain, s = stiffness/stretching sensation, NT = not tested) Comment: deferred on eval given proximity to surgery  FUNCTIONAL TESTS:  Sit to stand: BIL UE support, reduced WB through surgical limb, inc time/effort   GAIT: Distance walked: within clinic Assistive device utilized:  standard walker Level of assistance: Modified independence Comments: step to pattern, increased distance from RW, reduced gait speed/cadence, reduced WB through surgical limb as expected  TREATMENT DATE:  [facility] Adult PT Treatment:                                                DATE: 11/19/23 Therapeutic Exercise: Ankle pumps x20 Quad set 2x10 Glute set x20 TA activation x20 cues for breath control  Bent knee fallouts (small arc) 3x8 cues for comfortable ROM and set up  Mini clam w/ pillow 2x8 cues for comfortable ROM and setup  HEP update + education/handout  Self Care: Education/discussion re: ADLs and mobility that may be irritating symptoms and appropriate strategies to mitigate. Education on post op restrictions and protocol   [facility] Adult PT Treatment:                                                DATE: 11/12/23 Therapeutic Exercise: Glute sets, quad sets, ankle pumps, TA activation; all in supine, cues for appropriate performance/setup  Self Care: Dressing inspection/reinforcement (adhesive coming loose medially), post op restrictions, safety w/ mobility, appropriate positioning, monitoring surgical site, appropriate ADL modifications for safety/comfort   PATIENT EDUCATION:  Education details: rationale for interventions, HEP  Person educated:  Patient Education method: Explanation, Demonstration, Tactile cues, Verbal cues Education comprehension: verbalized understanding, returned demonstration, verbal cues required, tactile cues required, and needs further education     HOME EXERCISE PROGRAM: Access Code: [email or web address] URL: [email or web address] Date: 11/19/2023 Prepared by: Fransisco Hertz  Program Notes - with bent knee fall outs and clam shells, please keep in short range of motion as comfortable  Exercises - Supine Gluteal Sets  - 3-4 x daily - 1 sets - 10-12 reps - Supine Quadricep Sets  - 3-4 x daily - 1 sets - 10-12 reps - Supine Ankle Pumps  - 3-4 x daily - 1 sets - 10-12 reps - Supine Transversus Abdominis Bracing - Hands on Stomach  - 3-4 x daily - 1 sets - 10-12 reps - Bent Knee Fallouts  - 2-3 x daily - 1 sets - 8 reps - Clam with Pillow Between Knees  - 2-3 x daily - 1 sets - 8 reps  ASSESSMENT:  CLINICAL IMPRESSION: 11/19/2023 Pt arrives w/ 4/10 pain - no issues after initial eval but does note she may have been overdoing some ADLs/mobility around the house. Time spent w/ education on appropriate modifications and gradual progression per protocol. Today pt does well with HEP review and additional exercises per surgical protocol and emphasis on comfortable ROM, appropriate setup and monitoring symptoms. Considered introduction of manual given report of some increased symptoms the past several days but no significant tightness/tenderness about hamstrings, quads, adductors. No adverse events, no increases in resting pain as session goes on. Recommend continuing along current POC in order to address relevant deficits and improve functional tolerance. Pt departs today's session in no acute distress, all voiced questions/concerns addressed appropriately from PT perspective.    Per eval - Patient is a pleasant 48 y.o. woman who was seen today for physical therapy evaluation and treatment for L hip arthroscopic  labral repair DOS 11/09/23. Today she presents with mobility deficits as expected post op and difficulty performing usual activities. On exam she appears to be doing well post op, did require education on safety w/ mobility and  post op restrictions. Dressing reinforced but overall surgical site is well appearing. No concerning symptoms today. Tolerates HEP and exam well without adverse event. Recommend skilled PT to address aforementioned deficits with aim of improving functional tolerance and reducing pain with typical activities. Pt departs today's session in no acute distress, all voiced concerns/questions addressed appropriately from PT perspective.    OBJECTIVE IMPAIRMENTS: Abnormal gait, decreased activity tolerance, decreased endurance, decreased mobility, difficulty walking, decreased ROM, decreased strength, improper body mechanics, and pain.   ACTIVITY LIMITATIONS: carrying, lifting, bending, sitting, standing, squatting, sleeping, stairs, transfers, bed mobility, and locomotion level  PARTICIPATION LIMITATIONS: meal prep, cleaning, laundry, driving, shopping, community activity, and occupation  PERSONAL FACTORS: Time since onset of injury/illness/exacerbation and 3+ comorbidities: asthma, HTN, headaches  are also affecting patient's functional outcome.   REHAB POTENTIAL: Good  CLINICAL DECISION MAKING: Stable/uncomplicated  EVALUATION COMPLEXITY: Low   GOALS:  SHORT TERM GOALS: Target date: 12/24/2023  Pt will demonstrate appropriate understanding and performance of initially prescribed HEP in order to facilitate improved independence with management of symptoms.  Baseline: HEP established  Goal status: INITIAL   2. Pt will report at least 25% improvement in overall pain levels over past week in order to facilitate improved tolerance to typical daily activities.   Baseline: 0-5/10  Goal status: INITIAL    LONG TERM GOALS: Target date: 02/04/2024   Pt will improve MCID or  greater on LEFS in order to demonstrate improved perception of function due to symptoms (MCID 9 pts) Baseline: LEFS TBD Goal status: INITIAL  2.  Pt will demonstrate L hip AROM grossly WNL in order to facilitate improved tolerance to functional movements. Baseline: see ROM chart above Goal status: INITIAL  3.  Pt will demonstrate ability to navigate one flight of stairs safely w/o UE support and mechanics WNL in order to facilitate improved home navigation. Baseline: staying on main level Goal status: INITIAL  4.  Pt will be able to perform 5 consecutive sit to stands without pain and with mechanics grossly WNL in order to facilitate improved safety w/ transfers. Baseline: altered mechanics as expected Goal status: INITIAL    5. Pt will be able to ambulate community distances without limitation and with LRAD in order to facilitate improved community navigation and tolerance to occupational tasks.  Baseline: ambulating into clinic with mild pain, standard walker  Goal status: INITIAL    PLAN:  PT FREQUENCY: 1-2x/week  PT DURATION: 12 weeks  PLANNED INTERVENTIONS: 97164- PT Re-evaluation, 97110-Therapeutic exercises, 97530- Therapeutic activity, 97112- Neuromuscular re-education, 97535- Self Care, 78469- Manual therapy, [phone]- Gait training, [phone]- Aquatic Therapy, [phone]- Electrical stimulation (unattended), Patient/Family education, Balance training, Stair training, Taping, Dry Needling, Joint mobilization, Spinal mobilization, Scar mobilization, Cryotherapy, and Moist heat  PLAN FOR NEXT SESSION: Review/update HEP PRN. Progress through hip labral repair protocol as able/appropriate. Symptom modification strategies as indicated/appropriate.    Ashley Murrain PT, DPT 11/19/2023 12:48 PM

## 2023-11-19 ENCOUNTER — Ambulatory Visit (HOSPITAL_BASED_OUTPATIENT_CLINIC_OR_DEPARTMENT_OTHER): Payer: Commercial Managed Care - PPO | Admitting: Physical Therapy

## 2023-11-19 ENCOUNTER — Encounter (HOSPITAL_BASED_OUTPATIENT_CLINIC_OR_DEPARTMENT_OTHER): Payer: Self-pay | Admitting: Physical Therapy

## 2023-11-19 DIAGNOSIS — M6281 Muscle weakness (generalized): Secondary | ICD-10-CM

## 2023-11-19 DIAGNOSIS — R262 Difficulty in walking, not elsewhere classified: Secondary | ICD-10-CM | POA: Diagnosis not present

## 2023-11-19 DIAGNOSIS — R2689 Other abnormalities of gait and mobility: Secondary | ICD-10-CM

## 2023-11-19 DIAGNOSIS — M25552 Pain in left hip: Secondary | ICD-10-CM

## 2023-11-23 ENCOUNTER — Ambulatory Visit (HOSPITAL_BASED_OUTPATIENT_CLINIC_OR_DEPARTMENT_OTHER): Payer: Commercial Managed Care - PPO | Admitting: Orthopaedic Surgery

## 2023-11-23 DIAGNOSIS — S73192D Other sprain of left hip, subsequent encounter: Secondary | ICD-10-CM

## 2023-11-23 NOTE — Progress Notes (Signed)
 Post Operative Evaluation    Procedure/Date of Surgery: Left hip labral repair 3/10  Interval History:   Presents today 2 weeks status post left hip labral repair.  She is continuing to improve although she is having some hamstring pain at today's visit.     PMH/PSH/Family History/Social History/Meds/Allergies:    Past Medical History:  Diagnosis Date   Arthritis    right foot   Asthma    Congenital flat foot    has had bilateral foot surgery to correct   Frequent headaches    GERD (gastroesophageal reflux disease)    Hypertension    Irritable bowel syndrome (IBS)    no current med.   Painful orthopaedic hardware [facility]) 03/2013   right foot   Past Surgical History:  Procedure Laterality Date   CALCANEAL OSTEOTOMY Right 12/23/2012   Procedure: RIGHT CALCANEAL OSTEOTOMY, RIGHT LATERAL COLUMN LENGTHENING, RIGHT COTTON OSTEOTOMY, RIGHT FLEX DIGITORIUM LONGUS TRANSFER, RIGHT KIDNER PROCEDURE ;  Surgeon: Toni Arthurs, MD;  Location: [facility];  Service: Orthopedics;  Laterality: Right;   CESAREAN SECTION  07/13/2005; 06/03/2006   DILATION AND EVACUATION  08/16/2008   FOOT SURGERY Left 2002   FOOT SURGERY Right 10/2018   GASTROCNEMIUS RECESSION Right 12/23/2012   Procedure: RIGHT GASTRO RECESSION ;  Surgeon: Toni Arthurs, MD;  Location: [facility];  Service: Orthopedics;  Laterality: Right;   HARDWARE REMOVAL Right 03/24/2013   Procedure: HARDWARE REMOVAL RIGHT HEEL;  Surgeon: Toni Arthurs, MD;  Location:  SURGERY CENTER;  Service: Orthopedics;  Laterality: Right;   INTRAUTERINE DEVICE INSERTION     WISDOM TOOTH EXTRACTION     Social History   Socioeconomic History   Marital status: Married    Spouse name: Not on file   Number of children: 3   Years of education: Not on file   Highest education level: Bachelor's degree (e.g., BA, AB, BS)  Occupational History   Occupation: Teacher, adult education: [employer]   Tobacco Use   Smoking status: Never   Smokeless tobacco: Never  Vaping Use   Vaping status: Never Used  Substance and Sexual Activity   Alcohol use: No   Drug use: No   Sexual activity: Yes    Birth control/protection: I.U.D.  Other Topics Concern   Not on file  Social History Narrative   Not on file   Social Drivers of Health   Financial Resource Strain: Low Risk  (06/16/2023)   Overall Financial Resource Strain (CARDIA)    Difficulty of Paying Living Expenses: Not very hard  Food Insecurity: No Food Insecurity (06/16/2023)   Hunger Vital Sign    Worried About Running Out of Food in the Last Year: Never true    Ran Out of Food in the Last Year: Never true  Transportation Needs: No Transportation Needs (06/16/2023)   PRAPARE - Administrator, Civil Service (Medical): No    Lack of Transportation (Non-Medical): No  Physical Activity: Unknown (06/16/2023)   Exercise Vital Sign    Days of Exercise per Week: 0 days    Minutes of Exercise per Session: Not on file  Stress: Stress Concern Present (06/16/2023)   [employer] of Occupational Health - Occupational Stress Questionnaire    Feeling of Stress : Very much  Social Connections: Socially  Integrated (06/16/2023)   Social Connection and Isolation Panel [NHANES]    Frequency of Communication with Friends and Family: More than three times a week    Frequency of Social Gatherings with Friends and Family: Once a week    Attends Religious Services: More than 4 times per year    Active Member of [employer] or Organizations: Yes    Attends Engineer, structural: More than 4 times per year    Marital Status: Married   Family History  Problem Relation Age of Onset   Hypertension Mother    Hypertension Father    Diabetes Father    Irritable bowel syndrome Father    Arrhythmia Father    Breast cancer Paternal Grandmother    Colon cancer Cousin        maternal   Rectal cancer Neg Hx    Esophageal cancer  Neg Hx    Stomach cancer Neg Hx    Allergies  Allergen Reactions   Augmentin [Amoxicillin-Pot Clavulanate] Nausea And Vomiting   Biotin Other (See Comments)    Severe yeast infection   Sulfa Antibiotics Other (See Comments)    Reaction:  Muscle pain    Current Outpatient Medications  Medication Sig Dispense Refill   acetaminophen (TYLENOL) 500 MG tablet Take 1,000 mg by mouth every 6 (six) hours as needed.     amLODipine (NORVASC) 10 MG tablet Take 1 tablet (10 mg total) by mouth daily. 90 tablet 1   aspirin EC 325 MG tablet Take 1 tablet (325 mg total) by mouth daily. 14 tablet 0   aspirin-acetaminophen-caffeine (EXCEDRIN MIGRAINE) 250-250-65 MG tablet Take 1 tablet by mouth every 6 (six) hours as needed for headache.     celecoxib (CELEBREX) 200 MG capsule Take 1 capsule (200 mg total) by mouth 2 (two) times daily as needed. 60 capsule 5   chlorthalidone (HYGROTON) 25 MG tablet Take 1 tablet (25 mg total) by mouth daily. 90 tablet 1   gabapentin (NEURONTIN) 300 MG capsule Take 1 capsule (300 mg total) by mouth 3 (three) times daily as needed. 90 capsule 3   HYDROcodone-acetaminophen (NORCO/VICODIN) 5-325 MG tablet Take 1 tablet by mouth every 6 (six) hours as needed. (Patient not taking: Reported on 11/02/2023) 15 tablet 0   levocetirizine (XYZAL) 5 MG tablet Take 1 tablet (5 mg total) by mouth every evening. 90 tablet 1   levonorgestrel (MIRENA) 20 MCG/DAY IUD 1 each by Intrauterine route once.     montelukast (SINGULAIR) 10 MG tablet Take 1 tablet (10 mg total) by mouth at bedtime. 90 tablet 1   olmesartan (BENICAR) 20 MG tablet Take 0.5 tablets (10 mg total) by mouth daily. 90 tablet 1   oxyCODONE (ROXICODONE) 5 MG immediate release tablet Take 1 tablet (5 mg total) by mouth every 4 (four) hours as needed for severe pain (pain score 7-10) or breakthrough pain. 10 tablet 0   tiZANidine (ZANAFLEX) 2 MG tablet Take 1-2 tablets (2-4 mg total) by mouth every 8 (eight) hours as needed. 60  tablet 1   No current facility-administered medications for this visit.   No results found.  Review of Systems:   A ROS was performed including pertinent positives and negatives as documented in the HPI.   Musculoskeletal Exam:    There were no vitals taken for this visit.  Left hip incisions are well-appearing without erythema or drainage.  Range of motion 30 degrees internal/external rotation of the left hip.  She does have quite significant tightness and  tenderness about the mid hamstring distal neurosensory exam is intact  Imaging:      I personally reviewed and interpreted the radiographs.   Assessment:   2-week status post left hip labral repair.  Overall her hip and groin symptoms are doing well she is having some glue soreness as well as hamstring mid body soreness which I do believe we will need to aggressively work on at rehab.  Plan to see her back in 4 weeks for reassessment.  She will advance her weightbearing at this time  Plan :    -Return to clinic 4 weeks for reassessment      I personally saw and evaluated the patient, and participated in the management and treatment plan.  Huel Cote, MD Attending Physician, Orthopedic Surgery  This document was dictated using Dragon voice recognition software. A reasonable attempt at proof reading has been made to minimize errors.

## 2023-11-26 ENCOUNTER — Encounter (HOSPITAL_BASED_OUTPATIENT_CLINIC_OR_DEPARTMENT_OTHER): Payer: Self-pay

## 2023-11-26 ENCOUNTER — Ambulatory Visit (HOSPITAL_BASED_OUTPATIENT_CLINIC_OR_DEPARTMENT_OTHER): Payer: Commercial Managed Care - PPO

## 2023-11-26 DIAGNOSIS — R2689 Other abnormalities of gait and mobility: Secondary | ICD-10-CM

## 2023-11-26 DIAGNOSIS — M6281 Muscle weakness (generalized): Secondary | ICD-10-CM

## 2023-11-26 DIAGNOSIS — R262 Difficulty in walking, not elsewhere classified: Secondary | ICD-10-CM | POA: Diagnosis not present

## 2023-11-26 DIAGNOSIS — M25552 Pain in left hip: Secondary | ICD-10-CM

## 2023-11-26 NOTE — Therapy (Incomplete)
 OUTPATIENT PHYSICAL THERAPY TREATMENT   Patient Name: [name] MRN: [id number] DOB:[birth year], 48 y.o., female Today's Date: 11/27/2023  END OF SESSION:  PT End of Session - 11/26/23 1348     Visit Number 3    Number of Visits 24    Date for PT Re-Evaluation 02/04/24    Authorization Type [facility] [employer]    PT Start Time 1148    PT Stop Time 1233    PT Time Calculation (min) 45 min    Activity Tolerance Patient tolerated treatment well    Behavior During Therapy WFL for tasks assessed/performed               Past Medical History:  Diagnosis Date   Arthritis    right foot   Asthma    Congenital flat foot    has had bilateral foot surgery to correct   Frequent headaches    GERD (gastroesophageal reflux disease)    Hypertension    Irritable bowel syndrome (IBS)    no current med.   Painful orthopaedic hardware [facility]) 03/2013   right foot   Past Surgical History:  Procedure Laterality Date   CALCANEAL OSTEOTOMY Right 12/23/2012   Procedure: RIGHT CALCANEAL OSTEOTOMY, RIGHT LATERAL COLUMN LENGTHENING, RIGHT COTTON OSTEOTOMY, RIGHT FLEX DIGITORIUM LONGUS TRANSFER, RIGHT KIDNER PROCEDURE ;  Surgeon: Toni Arthurs, MD;  Location: [facility];  Service: Orthopedics;  Laterality: Right;   CESAREAN SECTION  07/13/2005; 06/03/2006   DILATION AND EVACUATION  08/16/2008   FOOT SURGERY Left 2002   FOOT SURGERY Right 10/2018   GASTROCNEMIUS RECESSION Right 12/23/2012   Procedure: RIGHT GASTRO RECESSION ;  Surgeon: Toni Arthurs, MD;  Location: [facility];  Service: Orthopedics;  Laterality: Right;   HARDWARE REMOVAL Right 03/24/2013   Procedure: HARDWARE REMOVAL RIGHT HEEL;  Surgeon: Toni Arthurs, MD;  Location: [facility];  Service: Orthopedics;  Laterality: Right;   INTRAUTERINE DEVICE INSERTION     WISDOM TOOTH EXTRACTION     Patient Active Problem List   Diagnosis Date Noted   Tear of left acetabular labrum 11/09/2023   Vitamin D  deficiency 03/31/2022   Chronic cough 09/27/2018   Cervical spondylosis 07/11/2018   Lymphocytic colitis 05/21/2018   Anxiety state 04/29/2016   Atypical chest pain 02/22/2016   Dysphagia, pharyngoesophageal phase 02/22/2016   Physical exam 12/15/2014   Left hip pain 09/29/2014   Radicular low back pain 09/11/2014   Bilateral hip bursitis 09/11/2014   BPV (benign positional vertigo) 02/22/2014   HTN (hypertension) 02/22/2014    PCP: Karie Georges, MD  REFERRING PROVIDER: Huel Cote, MD  REFERRING DIAG: [id number] (ICD-10-CM) - Tear of left acetabular labrum, subsequent encounter  THERAPY DIAG:  Left hip pain  Muscle weakness (generalized)  Other abnormalities of gait and mobility  Difficulty walking  Rationale for Evaluation and Treatment: Rehabilitation  ONSET DATE: 11/09/23 L hip scope with labral repair  SUBJECTIVE:    Pt reports hip is feeling good overall but her hamstring bothers her when she walks. MD requests DN for L HS.    Per eval - Pt states since surgery she has been doing pretty well, feels pain is a bit better than she expected. Has been using [place] quite a bit. Pt states she was told she could bear weight with walker. She does mention she has had pain with lower body dressing, particularly getting shoes on/off. States she hasn't performed any exercises yet for the most part but did try  to do SLR, stopped this due to pain (advised her to defer this movement given post op restrictions).  Has historically been quite active.  No distal pain, N/T. No redness/warmth.   SUBJECTIVE STATEMENT: 11/27/2023 reports 4/10 pain in lateral hip at present. Mostly has pain with particular movements. Doing HEP twice a day, no issues. Using walker full time. Pt is doing most of her own ADLs, states she has been having a bit more pain the past several days and says she may have been overdoing some activities.  PERTINENT HISTORY: asthma, GERD, headaches, HTN, IBS,  BPPV PAIN:  Are you having pain: 4/10 lateral L hip  Per eval -  Location/description:L hip anterior/lateral Best-worst over past week: 0-5/10  - aggravating factors: movement, especially lower body dressing/hygiene - Easing factors: icing    PRECAUTIONS: hip labral repair protocol  RED FLAGS: None   WEIGHT BEARING RESTRICTIONS: Per op note WBAT LLE  FALLS:  Has patient fallen in last 6 months? No  LIVING ENVIRONMENT: 2 story home bed/bath downstairs, lives w/ spouse and kids (18 and 13). Laundry upstairs . 1STE Currently staying with parents, staying on lower level. 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  DIAGNOSTIC FINDINGS:  DOS 11/09/23 L hip arthroscopic labral repair  PATIENT SURVEYS:  LEFS 25/80  11/19/23  COGNITION: Overall cognitive status: Within functional limits for tasks assessed     SENSATION: Denies sensory complaints  EDEMA/INSPECTION:  Dressing appears clean and intact, no significant redness or swelling about surgical site. No apparent drainage/bleeding.    LOWER EXTREMITY ROM:  ROM Right eval Left eval  Hip flexion    Hip extension    Hip abduction    Hip adduction    Hip internal rotation    Hip external rotation    Knee flexion    Knee extension    Ankle dorsiflexion    Ankle plantarflexion    Ankle inversion    Ankle eversion     (Blank rows = not tested) (Key: WFL = within functional limits not formally assessed, * = concordant pain, s = stiffness/stretching sensation, NT = not tested) Comments: PROM only on eval; flex/ER/IR painless, maintaining ROM within surgical  protocol  LOWER EXTREMITY MMT:  MMT Right eval Left eval  Hip flexion    Hip extension    Hip abduction    Hip adduction    Hip internal rotation    Hip external rotation    Knee flexion    Knee extension    Ankle dorsiflexion    Ankle plantarflexion    Ankle inversion    Ankle eversion     (Blank rows = not tested) (Key: WFL = within functional limits not formally assessed, * = concordant pain, s = stiffness/stretching sensation, NT = not tested) Comment: deferred on eval given proximity to surgery  FUNCTIONAL TESTS:  Sit to stand: BIL UE support, reduced WB through surgical limb, inc time/effort   GAIT: Distance walked: within clinic Assistive device utilized:  standard walker Level of assistance: Modified independence Comments: step to pattern, increased distance from RW, reduced gait speed/cadence, reduced WB through surgical limb as expected  TREATMENT DATE:   Treatment                            11/26/23: Blank lines following charge title = not provided on this treatment date.   Manual:  TPDN YES Trigger Point Dry Needling  Subsequent Treatment: Instructions reviewed, if requested by the patient, prior to subsequent dry needling treatment.   Patient Verbal Consent Given: Yes Education Handout Provided: Yes Muscles Treated: L Hamstrings 4x in lateral hamstring using a .30x50 needle Electrical Stimulation Performed: No Treatment Response/Outcome: Good twitch response   Needling performed by Lorayne Bender PT DPT  There-ex: PROM L hip HS sets  Standing HR/TR x20 Standing march x10 Gait Training Stair training with step to pattern x4 steps, single rail use for review at home Gait with FWW in hall  Trial of SPC gait in hall       [facility] Adult PT Treatment:                                                DATE:  11/19/23 Therapeutic Exercise: Ankle pumps x20 Quad set 2x10 Glute set x20 TA activation x20 cues for breath control  Bent knee fallouts (small arc) 3x8 cues for comfortable ROM and set up  Mini clam w/ pillow 2x8 cues for comfortable ROM and setup  HEP update + education/handout  Self Care: Education/discussion re: ADLs and mobility that may be irritating symptoms and appropriate strategies to mitigate. Education on post op restrictions and protocol   [facility] Adult PT Treatment:                                                DATE: 11/12/23 Therapeutic Exercise: Glute sets, quad sets, ankle pumps, TA activation; all in supine, cues for appropriate performance/setup  Self Care: Dressing inspection/reinforcement (adhesive coming loose medially), post op restrictions, safety w/ mobility, appropriate positioning, monitoring surgical site, appropriate ADL modifications for safety/comfort   PATIENT EDUCATION:  Education details: rationale for interventions, HEP  Person educated: Patient Education method: Explanation, Demonstration, Tactile cues, Verbal cues Education comprehension: verbalized understanding, returned demonstration, verbal cues required, tactile cues required, and needs further education     HOME EXERCISE PROGRAM: Access Code: [email or web address] URL: [email or web address] Date: 11/19/2023 Prepared by: Fransisco Hertz  Program Notes - with bent knee fall outs and clam shells, please keep in short range of motion as comfortable  Exercises - Supine Gluteal Sets  - 3-4 x daily - 1 sets - 10-12 reps - Supine Quadricep Sets  - 3-4 x daily - 1 sets - 10-12 reps - Supine Ankle Pumps  - 3-4 x daily - 1 sets - 10-12 reps - Supine Transversus Abdominis Bracing - Hands on Stomach  - 3-4 x daily - 1 sets - 10-12 reps - Bent Knee Fallouts  - 2-3 x daily - 1 sets - 8 reps - Clam with Pillow Between Knees  - 2-3 x daily - 1 sets - 8 reps  ASSESSMENT:  CLINICAL IMPRESSION: DN  performed by PT Lorayne Bender to decrease restrictions in L HS. Worked on stair straining in clinic for step to pattern for home performance. Pt was able to trial use  of SPC in clinic. No pain reported with this, only mild unsteadiness observed. Advised pt to continue with standard walker until performance with SPC improves in clinic, though she can go ahead and purchase SPC. Will continue to progress as tolerated and monitor HS symptoms.  Per eval - Patient is a pleasant 48 y.o. woman who was seen today for physical therapy evaluation and treatment for L hip arthroscopic labral repair DOS 11/09/23. Today she presents with mobility deficits as expected post op and difficulty performing usual activities. On exam she appears to be doing well post op, did require education on safety w/ mobility and post op restrictions. Dressing reinforced but overall surgical site is well appearing. No concerning symptoms today. Tolerates HEP and exam well without adverse event. Recommend skilled PT to address aforementioned deficits with aim of improving functional tolerance and reducing pain with typical activities. Pt departs today's session in no acute distress, all voiced concerns/questions addressed appropriately from PT perspective.    OBJECTIVE IMPAIRMENTS: Abnormal gait, decreased activity tolerance, decreased endurance, decreased mobility, difficulty walking, decreased ROM, decreased strength, improper body mechanics, and pain.   ACTIVITY LIMITATIONS: carrying, lifting, bending, sitting, standing, squatting, sleeping, stairs, transfers, bed mobility, and locomotion level  PARTICIPATION LIMITATIONS: meal prep, cleaning, laundry, driving, shopping, community activity, and occupation  PERSONAL FACTORS: Time since onset of injury/illness/exacerbation and 3+ comorbidities: asthma, HTN, headaches  are also affecting patient's functional outcome.   REHAB POTENTIAL: Good  CLINICAL DECISION MAKING:  Stable/uncomplicated  EVALUATION COMPLEXITY: Low   GOALS:  SHORT TERM GOALS: Target date: 12/24/2023  Pt will demonstrate appropriate understanding and performance of initially prescribed HEP in order to facilitate improved independence with management of symptoms.  Baseline: HEP established  Goal status: INITIAL   2. Pt will report at least 25% improvement in overall pain levels over past week in order to facilitate improved tolerance to typical daily activities.   Baseline: 0-5/10  Goal status: INITIAL    LONG TERM GOALS: Target date: 02/04/2024   Pt will improve MCID or greater on LEFS in order to demonstrate improved perception of function due to symptoms (MCID 9 pts) Baseline: LEFS TBD Goal status: INITIAL  2.  Pt will demonstrate L hip AROM grossly WNL in order to facilitate improved tolerance to functional movements. Baseline: see ROM chart above Goal status: INITIAL  3.  Pt will demonstrate ability to navigate one flight of stairs safely w/o UE support and mechanics WNL in order to facilitate improved home navigation. Baseline: staying on main level Goal status: INITIAL  4.  Pt will be able to perform 5 consecutive sit to stands without pain and with mechanics grossly WNL in order to facilitate improved safety w/ transfers. Baseline: altered mechanics as expected Goal status: INITIAL    5. Pt will be able to ambulate community distances without limitation and with LRAD in order to facilitate improved community navigation and tolerance to occupational tasks.  Baseline: ambulating into clinic with mild pain, standard walker  Goal status: INITIAL    PLAN:  PT FREQUENCY: 1-2x/week  PT DURATION: 12 weeks  PLANNED INTERVENTIONS: 97164- PT Re-evaluation, 97110-Therapeutic exercises, 97530- Therapeutic activity, 97112- Neuromuscular re-education, 97535- Self Care, 40981- Manual therapy, [phone]- Gait training, [phone]- Aquatic Therapy, [phone]- Electrical stimulation  (unattended), Patient/Family education, Balance training, Stair training, Taping, Dry Needling, Joint mobilization, Spinal mobilization, Scar mobilization, Cryotherapy, and Moist heat  PLAN FOR NEXT SESSION: Review/update HEP PRN. Progress through hip labral repair protocol as able/appropriate. Symptom modification strategies as indicated/appropriate.  Riki Altes, PTA  11/27/2023 7:53 AM

## 2023-11-27 ENCOUNTER — Other Ambulatory Visit (HOSPITAL_COMMUNITY): Payer: Self-pay

## 2023-11-27 ENCOUNTER — Other Ambulatory Visit: Payer: Self-pay | Admitting: Orthopaedic Surgery

## 2023-11-27 ENCOUNTER — Other Ambulatory Visit: Payer: Self-pay | Admitting: Family Medicine

## 2023-11-27 MED ORDER — OXYCODONE HCL 5 MG PO TABS
5.0000 mg | ORAL_TABLET | ORAL | 0 refills | Status: DC | PRN
  Filled 2023-11-27: qty 10, 2d supply, fill #0

## 2023-11-27 NOTE — Patient Instructions (Signed)

## 2023-11-30 ENCOUNTER — Other Ambulatory Visit: Payer: Self-pay

## 2023-11-30 ENCOUNTER — Other Ambulatory Visit (HOSPITAL_BASED_OUTPATIENT_CLINIC_OR_DEPARTMENT_OTHER): Payer: Self-pay

## 2023-11-30 MED ORDER — TIZANIDINE HCL 2 MG PO TABS
2.0000 mg | ORAL_TABLET | Freq: Three times a day (TID) | ORAL | 0 refills | Status: DC | PRN
  Filled 2023-11-30: qty 60, 10d supply, fill #0

## 2023-11-30 NOTE — Telephone Encounter (Signed)
 Last OV 08/31/23 Next OV not scheduled  Last refill 08/05/23 Qty # 60/1

## 2023-12-03 ENCOUNTER — Ambulatory Visit (HOSPITAL_BASED_OUTPATIENT_CLINIC_OR_DEPARTMENT_OTHER): Payer: Commercial Managed Care - PPO | Attending: Orthopaedic Surgery

## 2023-12-03 ENCOUNTER — Encounter (HOSPITAL_BASED_OUTPATIENT_CLINIC_OR_DEPARTMENT_OTHER): Payer: Self-pay

## 2023-12-03 DIAGNOSIS — M6281 Muscle weakness (generalized): Secondary | ICD-10-CM | POA: Diagnosis not present

## 2023-12-03 DIAGNOSIS — M25552 Pain in left hip: Secondary | ICD-10-CM | POA: Diagnosis not present

## 2023-12-03 DIAGNOSIS — R262 Difficulty in walking, not elsewhere classified: Secondary | ICD-10-CM | POA: Insufficient documentation

## 2023-12-03 DIAGNOSIS — R2689 Other abnormalities of gait and mobility: Secondary | ICD-10-CM | POA: Diagnosis not present

## 2023-12-03 NOTE — Therapy (Addendum)
 OUTPATIENT PHYSICAL THERAPY TREATMENT   Patient Name: [name] MRN: [id number] DOB:1976-06-17, 48 y.o., female Today's Date: 12/03/2023  END OF SESSION:  PT End of Session - 12/03/23 1152     Visit Number 4    Number of Visits 24    Date for PT Re-Evaluation 02/04/24    Authorization Type [facility] [employer]    PT Start Time 1150   pt arrived late   PT Stop Time 1240    PT Time Calculation (min) 50 min    Activity Tolerance Patient tolerated treatment well    Behavior During Therapy WFL for tasks assessed/performed                Past Medical History:  Diagnosis Date   Arthritis    right foot   Asthma    Congenital flat foot    has had bilateral foot surgery to correct   Frequent headaches    GERD (gastroesophageal reflux disease)    Hypertension    Irritable bowel syndrome (IBS)    no current med.   Painful orthopaedic hardware [facility]) 03/2013   right foot   Past Surgical History:  Procedure Laterality Date   CALCANEAL OSTEOTOMY Right 12/23/2012   Procedure: RIGHT CALCANEAL OSTEOTOMY, RIGHT LATERAL COLUMN LENGTHENING, RIGHT COTTON OSTEOTOMY, RIGHT FLEX DIGITORIUM LONGUS TRANSFER, RIGHT KIDNER PROCEDURE ;  Surgeon: Toni Arthurs, MD;  Location: [facility];  Service: Orthopedics;  Laterality: Right;   CESAREAN SECTION  07/13/2005; 06/03/2006   DILATION AND EVACUATION  08/16/2008   FOOT SURGERY Left 2002   FOOT SURGERY Right 10/2018   GASTROCNEMIUS RECESSION Right 12/23/2012   Procedure: RIGHT GASTRO RECESSION ;  Surgeon: Toni Arthurs, MD;  Location: [facility];  Service: Orthopedics;  Laterality: Right;   HARDWARE REMOVAL Right 03/24/2013   Procedure: HARDWARE REMOVAL RIGHT HEEL;  Surgeon: Toni Arthurs, MD;  Location: [facility];  Service: Orthopedics;  Laterality: Right;   INTRAUTERINE DEVICE INSERTION     WISDOM TOOTH EXTRACTION     Patient Active Problem List   Diagnosis Date Noted   Tear of left acetabular labrum 11/09/2023    Vitamin D deficiency 03/31/2022   Chronic cough 09/27/2018   Cervical spondylosis 07/11/2018   Lymphocytic colitis 05/21/2018   Anxiety state 04/29/2016   Atypical chest pain 02/22/2016   Dysphagia, pharyngoesophageal phase 02/22/2016   Physical exam 12/15/2014   Left hip pain 09/29/2014   Radicular low back pain 09/11/2014   Bilateral hip bursitis 09/11/2014   BPV (benign positional vertigo) 02/22/2014   HTN (hypertension) 02/22/2014    PCP: Karie Georges, MD  REFERRING PROVIDER: Huel Cote, MD  REFERRING DIAG: [phone] (ICD-10-CM) - Tear of left acetabular labrum, subsequent encounter  THERAPY DIAG:  Left hip pain  Muscle weakness (generalized)  Difficulty walking  Other abnormalities of gait and mobility  Rationale for Evaluation and Treatment: Rehabilitation  ONSET DATE: 11/09/23 L hip scope with labral repair  SUBJECTIVE:    Pt arrives with SPC. Pt reports ongoing HS complaints. Unsure if needling was beneficial last session, but willing to try it again. Hip is doing well. No pain unless she turns a certain way in bed.    Per eval - Pt states since surgery she has been doing pretty well, feels pain is a bit better than she expected. Has been using [place] quite a bit. Pt states she was told she could bear weight with walker. She does mention she has had pain with lower  body dressing, particularly getting shoes on/off. States she hasn't performed any exercises yet for the most part but did try to do SLR, stopped this due to pain (advised her to defer this movement given post op restrictions).  Has historically been quite active.  No distal pain, N/T. No redness/warmth.   SUBJECTIVE STATEMENT: 12/03/2023 reports 4/10 pain in lateral hip at present. Mostly has pain with particular movements. Doing HEP twice a day, no issues. Using walker full time. Pt is doing most of her own ADLs, states she has been having a bit more pain the past several days and says she may  have been overdoing some activities.  PERTINENT HISTORY: asthma, GERD, headaches, HTN, IBS, BPPV PAIN:  Are you having pain: 4/10 lateral L hip  Per eval -  Location/description:L hip anterior/lateral Best-worst over past week: 0-5/10  - aggravating factors: movement, especially lower body dressing/hygiene - Easing factors: icing    PRECAUTIONS: hip labral repair protocol  RED FLAGS: None   WEIGHT BEARING RESTRICTIONS: Per op note WBAT LLE  FALLS:  Has patient fallen in last 6 months? No  LIVING ENVIRONMENT: 2 story home bed/bath downstairs, lives w/ spouse and kids (18 and 36). Laundry upstairs . 1STE Currently staying with parents, staying on lower level. 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  DIAGNOSTIC FINDINGS:  DOS 11/09/23 L hip arthroscopic labral repair  PATIENT SURVEYS:  LEFS 25/80  11/19/23  COGNITION: Overall cognitive status: Within functional limits for tasks assessed     SENSATION: Denies sensory complaints  EDEMA/INSPECTION:  Dressing appears clean and intact, no significant redness or swelling about surgical site. No apparent drainage/bleeding.    LOWER EXTREMITY ROM:  ROM Right eval Left eval  Hip flexion    Hip extension    Hip abduction    Hip adduction    Hip internal rotation    Hip external rotation    Knee flexion    Knee extension    Ankle dorsiflexion    Ankle plantarflexion    Ankle inversion    Ankle eversion     (Blank rows = not tested) (Key: WFL = within functional limits not formally assessed, * = concordant pain, s = stiffness/stretching sensation, NT =  not tested) Comments: PROM only on eval; flex/ER/IR painless, maintaining ROM within surgical protocol  LOWER EXTREMITY MMT:  MMT Right eval Left eval  Hip flexion    Hip extension    Hip abduction    Hip adduction    Hip internal rotation    Hip external rotation    Knee flexion    Knee extension    Ankle dorsiflexion    Ankle plantarflexion    Ankle inversion    Ankle eversion     (Blank rows = not tested) (Key: WFL = within functional limits not formally assessed, * = concordant pain, s = stiffness/stretching sensation, NT = not tested) Comment: deferred on eval given proximity to surgery  FUNCTIONAL TESTS:  Sit to stand: BIL UE support, reduced WB through surgical limb, inc time/effort   GAIT: Distance walked: within clinic Assistive device utilized:  standard walker Level of assistance: Modified independence Comments: step  to pattern, increased distance from RW, reduced gait speed/cadence, reduced WB through surgical limb as expected                                                                                                                                TREATMENT DATE:     Treatment                            12/03/23: Blank lines following charge title = not provided on this treatment date.   Manual:  TPDN YES Trigger Point Dry Needling  Subsequent Treatment: Instructions reviewed, if requested by the patient, prior to subsequent dry needling treatment.   Patient Verbal Consent Given: Yes Education Handout Provided: Yes Muscles Treated: L Hamstrings 4x in lateral hamstring using a .30x50 needle Electrical Stimulation Performed: No Treatment Response/Outcome: Good twitch response   Needling performed by Lorayne Bender PT DPT STM to L HS, IASTM using roller There-ex: PROM L hip HS sets x10 Long sit HSS Gait with SPC in hall    Treatment                            11/26/23: Blank lines following charge title = not provided on this treatment date.    Manual:  TPDN YES Trigger Point Dry Needling  Subsequent Treatment: Instructions reviewed, if requested by the patient, prior to subsequent dry needling treatment.   Patient Verbal Consent Given: Yes Education Handout Provided: Yes Muscles Treated: L Hamstrings 4x in lateral hamstring using a .30x50 needle 3 time s in medial hamstring using a .30x50 needle.  Electrical Stimulation Performed: No Treatment Response/Outcome: Good twitch response   Needling performed by Lorayne Bender PT DPT  There-ex: PROM L hip HS sets  Standing HR/TR x20 Standing march x10 Gait Training Stair training with step to pattern x4 steps, single rail use for review at home Gait with FWW in hall  Trial of SPC gait in hall       [facility] Adult PT Treatment:                                                DATE: 11/19/23 Therapeutic Exercise: Ankle pumps x20 Quad set 2x10 Glute set x20 TA activation x20 cues for breath control  Bent knee fallouts (small arc) 3x8 cues for comfortable ROM and set up  Mini clam w/ pillow 2x8 cues for comfortable ROM and setup  HEP update + education/handout  Self Care: Education/discussion re: ADLs and mobility that may be irritating symptoms and appropriate strategies to mitigate. Education on post op restrictions and protocol   [facility] Adult PT Treatment:  DATE: 11/12/23 Therapeutic Exercise: Glute sets, quad sets, ankle pumps, TA activation; all in supine, cues for appropriate performance/setup  Self Care: Dressing inspection/reinforcement (adhesive coming loose medially), post op restrictions, safety w/ mobility, appropriate positioning, monitoring surgical site, appropriate ADL modifications for safety/comfort   PATIENT EDUCATION:  Education details: rationale for interventions, HEP  Person educated: Patient Education method: Explanation, Demonstration, Tactile cues, Verbal cues Education comprehension: verbalized  understanding, returned demonstration, verbal cues required, tactile cues required, and needs further education     HOME EXERCISE PROGRAM: Access Code: [email or web address] URL: [email or web address] Date: 11/19/2023 Prepared by: Fransisco Hertz  Program Notes - with bent knee fall outs and clam shells, please keep in short range of motion as comfortable  Exercises - Supine Gluteal Sets  - 3-4 x daily - 1 sets - 10-12 reps - Supine Quadricep Sets  - 3-4 x daily - 1 sets - 10-12 reps - Supine Ankle Pumps  - 3-4 x daily - 1 sets - 10-12 reps - Supine Transversus Abdominis Bracing - Hands on Stomach  - 3-4 x daily - 1 sets - 10-12 reps - Bent Knee Fallouts  - 2-3 x daily - 1 sets - 8 reps - Clam with Pillow Between Knees  - 2-3 x daily - 1 sets - 8 reps  ASSESSMENT:  CLINICAL IMPRESSION: DN again performed by PT Lorayne Bender to decrease restrictions in L HS. Pt able to demonstrate proper gait pattern with SPC without unsteadiness in clinic. She tolerates PROM well in all planes. Trialed HS isometrics today with mild discomfort. Will see if HS discomfort continues/worsens with gentle activation exercises. Also prescribed long sit HSS for at home as she is tighter in L HS compared to R.   Per eval - Patient is a pleasant 48 y.o. woman who was seen today for physical therapy evaluation and treatment for L hip arthroscopic labral repair DOS 11/09/23. Today she presents with mobility deficits as expected post op and difficulty performing usual activities. On exam she appears to be doing well post op, did require education on safety w/ mobility and post op restrictions. Dressing reinforced but overall surgical site is well appearing. No concerning symptoms today. Tolerates HEP and exam well without adverse event. Recommend skilled PT to address aforementioned deficits with aim of improving functional tolerance and reducing pain with typical activities. Pt departs today's session in no acute distress,  all voiced concerns/questions addressed appropriately from PT perspective.    OBJECTIVE IMPAIRMENTS: Abnormal gait, decreased activity tolerance, decreased endurance, decreased mobility, difficulty walking, decreased ROM, decreased strength, improper body mechanics, and pain.   ACTIVITY LIMITATIONS: carrying, lifting, bending, sitting, standing, squatting, sleeping, stairs, transfers, bed mobility, and locomotion level  PARTICIPATION LIMITATIONS: meal prep, cleaning, laundry, driving, shopping, community activity, and occupation  PERSONAL FACTORS: Time since onset of injury/illness/exacerbation and 3+ comorbidities: asthma, HTN, headaches  are also affecting patient's functional outcome.   REHAB POTENTIAL: Good  CLINICAL DECISION MAKING: Stable/uncomplicated  EVALUATION COMPLEXITY: Low   GOALS:  SHORT TERM GOALS: Target date: 12/24/2023  Pt will demonstrate appropriate understanding and performance of initially prescribed HEP in order to facilitate improved independence with management of symptoms.  Baseline: HEP established  Goal status: INITIAL   2. Pt will report at least 25% improvement in overall pain levels over past week in order to facilitate improved tolerance to typical daily activities.   Baseline: 0-5/10  Goal status: INITIAL    LONG TERM GOALS: Target date: 02/04/2024   Pt will improve MCID or  greater on LEFS in order to demonstrate improved perception of function due to symptoms (MCID 9 pts) Baseline: LEFS TBD Goal status: INITIAL  2.  Pt will demonstrate L hip AROM grossly WNL in order to facilitate improved tolerance to functional movements. Baseline: see ROM chart above Goal status: INITIAL  3.  Pt will demonstrate ability to navigate one flight of stairs safely w/o UE support and mechanics WNL in order to facilitate improved home navigation. Baseline: staying on main level Goal status: INITIAL  4.  Pt will be able to perform 5 consecutive sit to stands  without pain and with mechanics grossly WNL in order to facilitate improved safety w/ transfers. Baseline: altered mechanics as expected Goal status: INITIAL    5. Pt will be able to ambulate community distances without limitation and with LRAD in order to facilitate improved community navigation and tolerance to occupational tasks.  Baseline: ambulating into clinic with mild pain, standard walker  Goal status: INITIAL    PLAN:  PT FREQUENCY: 1-2x/week  PT DURATION: 12 weeks  PLANNED INTERVENTIONS: 97164- PT Re-evaluation, 97110-Therapeutic exercises, 97530- Therapeutic activity, 97112- Neuromuscular re-education, 97535- Self Care, 16109- Manual therapy, [phone]- Gait training, [phone]- Aquatic Therapy, [phone]- Electrical stimulation (unattended), Patient/Family education, Balance training, Stair training, Taping, Dry Needling, Joint mobilization, Spinal mobilization, Scar mobilization, Cryotherapy, and Moist heat  PLAN FOR NEXT SESSION: Review/update HEP PRN. Progress through hip labral repair protocol as able/appropriate. Symptom modification strategies as indicated/appropriate.    Riki Altes, PTA   Needling performed by Lorayne Bender PT DPT 12/03/2023 1:42 PM

## 2023-12-08 ENCOUNTER — Ambulatory Visit (HOSPITAL_BASED_OUTPATIENT_CLINIC_OR_DEPARTMENT_OTHER): Admitting: Physical Therapy

## 2023-12-08 DIAGNOSIS — M25552 Pain in left hip: Secondary | ICD-10-CM | POA: Diagnosis not present

## 2023-12-08 DIAGNOSIS — R262 Difficulty in walking, not elsewhere classified: Secondary | ICD-10-CM

## 2023-12-08 DIAGNOSIS — M6281 Muscle weakness (generalized): Secondary | ICD-10-CM

## 2023-12-08 DIAGNOSIS — R2689 Other abnormalities of gait and mobility: Secondary | ICD-10-CM | POA: Diagnosis not present

## 2023-12-08 NOTE — Therapy (Signed)
 OUTPATIENT PHYSICAL THERAPY TREATMENT   Patient Name: [name] MRN: [id number] DOB:[birth year], 48 y.o., female Today's Date: 12/08/2023  END OF SESSION:  PT End of Session - 12/08/23 1429     Visit Number 5    Number of Visits 24    Date for PT Re-Evaluation 02/04/24    Authorization Type [facility] [employer]    PT Start Time 1430    PT Stop Time 1510    PT Time Calculation (min) 40 min    Activity Tolerance Patient tolerated treatment well    Behavior During Therapy WFL for tasks assessed/performed              Past Medical History:  Diagnosis Date   Arthritis    right foot   Asthma    Congenital flat foot    has had bilateral foot surgery to correct   Frequent headaches    GERD (gastroesophageal reflux disease)    Hypertension    Irritable bowel syndrome (IBS)    no current med.   Painful orthopaedic hardware [facility]) 03/2013   right foot   Past Surgical History:  Procedure Laterality Date   CALCANEAL OSTEOTOMY Right 12/23/2012   Procedure: RIGHT CALCANEAL OSTEOTOMY, RIGHT LATERAL COLUMN LENGTHENING, RIGHT COTTON OSTEOTOMY, RIGHT FLEX DIGITORIUM LONGUS TRANSFER, RIGHT KIDNER PROCEDURE ;  Surgeon: Toni Arthurs, MD;  Location: [facility];  Service: Orthopedics;  Laterality: Right;   CESAREAN SECTION  07/13/2005; 06/03/2006   DILATION AND EVACUATION  08/16/2008   FOOT SURGERY Left 2002   FOOT SURGERY Right 10/2018   GASTROCNEMIUS RECESSION Right 12/23/2012   Procedure: RIGHT GASTRO RECESSION ;  Surgeon: Toni Arthurs, MD;  Location: [facility];  Service: Orthopedics;  Laterality: Right;   HARDWARE REMOVAL Right 03/24/2013   Procedure: HARDWARE REMOVAL RIGHT HEEL;  Surgeon: Toni Arthurs, MD;  Location: [facility];  Service: Orthopedics;  Laterality: Right;   INTRAUTERINE DEVICE INSERTION     WISDOM TOOTH EXTRACTION     Patient Active Problem List   Diagnosis Date Noted   Tear of left acetabular labrum 11/09/2023   Vitamin D  deficiency 03/31/2022   Chronic cough 09/27/2018   Cervical spondylosis 07/11/2018   Lymphocytic colitis 05/21/2018   Anxiety state 04/29/2016   Atypical chest pain 02/22/2016   Dysphagia, pharyngoesophageal phase 02/22/2016   Physical exam 12/15/2014   Left hip pain 09/29/2014   Radicular low back pain 09/11/2014   Bilateral hip bursitis 09/11/2014   BPV (benign positional vertigo) 02/22/2014   HTN (hypertension) 02/22/2014    PCP: Karie Georges, MD  REFERRING PROVIDER: Huel Cote, MD  REFERRING DIAG: [phone] (ICD-10-CM) - Tear of left acetabular labrum, subsequent encounter  THERAPY DIAG:  Left hip pain  Muscle weakness (generalized)  Difficulty walking  Other abnormalities of gait and mobility  Rationale for Evaluation and Treatment: Rehabilitation  ONSET DATE: 11/09/23 L hip scope with labral repair  SUBJECTIVE:   Per eval - Pt states since surgery she has been doing pretty well, feels pain is a bit better than she expected. Has been using [place] quite a bit. Pt states she was told she could bear weight with walker. She does mention she has had pain with lower body dressing, particularly getting shoes on/off. States she hasn't performed any exercises yet for the most part but did try to do SLR, stopped this due to pain (advised her to defer this movement given post op restrictions).  Has historically been quite active.  No  distal pain, N/T. No redness/warmth.   SUBJECTIVE STATEMENT: 12/08/2023 Pt states her hamstring pain is improving. Feeling it most when she bends over. Feels that dry needling did help. Some soreness in anterior/lateral L hip.   PERTINENT HISTORY: asthma, GERD, headaches, HTN, IBS, BPPV PAIN:  Are you having pain: 3-4/10 L hamstring  Per eval -  Location/description:L hip anterior/lateral Best-worst over past week: 0-5/10  - aggravating factors: movement, especially lower body dressing/hygiene - Easing factors: icing    PRECAUTIONS:  hip labral repair protocol  RED FLAGS: None   WEIGHT BEARING RESTRICTIONS: Per op note WBAT LLE  FALLS:  Has patient fallen in last 6 months? No  LIVING ENVIRONMENT: 2 story home bed/bath downstairs, lives w/ spouse and kids (18 and 31). Laundry upstairs . 1STE Currently staying with parents, staying on lower level. 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  DIAGNOSTIC FINDINGS:  DOS 11/09/23 L hip arthroscopic labral repair  PATIENT SURVEYS:  LEFS 25/80  11/19/23  COGNITION: Overall cognitive status: Within functional limits for tasks assessed     SENSATION: Denies sensory complaints  EDEMA/INSPECTION:  Dressing appears clean and intact, no significant redness or swelling about surgical site. No apparent drainage/bleeding.    LOWER EXTREMITY ROM:  ROM Right eval Left eval  Hip flexion    Hip extension    Hip abduction    Hip adduction    Hip internal rotation    Hip external rotation    Knee flexion    Knee extension    Ankle dorsiflexion    Ankle plantarflexion    Ankle inversion    Ankle eversion     (Blank rows = not tested) (Key: WFL = within functional limits not formally assessed, * = concordant pain, s = stiffness/stretching sensation, NT = not tested) Comments: PROM only on eval; flex/ER/IR painless, maintaining ROM within surgical protocol  LOWER EXTREMITY MMT:  MMT Right eval Left eval  Hip flexion    Hip extension    Hip abduction    Hip adduction    Hip internal rotation    Hip external rotation    Knee flexion    Knee extension    Ankle dorsiflexion    Ankle plantarflexion     Ankle inversion    Ankle eversion     (Blank rows = not tested) (Key: WFL = within functional limits not formally assessed, * = concordant pain, s = stiffness/stretching sensation, NT = not tested) Comment: deferred on eval given proximity to surgery  FUNCTIONAL TESTS:  Sit to stand: BIL UE support, reduced WB through surgical limb, inc time/effort   GAIT: Distance walked: within clinic Assistive device utilized:  standard walker Level of assistance: Modified independence Comments: step to pattern, increased distance from RW, reduced gait speed/cadence, reduced WB through surgical limb as expected  TREATMENT DATE:  Treatment                            12/08/23: Blank lines following charge title = not provided on this treatment date.   Manual:  TPDN YES Trigger Point Dry Needling  Subsequent Treatment: Instructions reviewed, if requested by the patient, prior to subsequent dry needling treatment.   Patient Verbal Consent Given: Yes Education Handout Provided: Yes Muscles Treated: L Hamstrings 2x in medial hamstring using a .30x50 needle Electrical Stimulation Performed: No Treatment Response/Outcome: Good twitch response   Needling performed by Lorayne Bender PT DPT STM to L HS, IASTM using roller  There-ex: Recumbent bike L3 x 6 min Seated HS stretch x30" with knees straight and then knees bent Hip hinge into L stretch for hamstring stretch in standing Clamshell yellow TB 2x10  Theract: Staggered stance deadlift x10 Gait with SPC in hall Qped donkey kick 2x10 yellow TB Qped alternating UE raise 2x10 Half kneel to tall kneel 2x10   Treatment                            12/03/23: Blank lines following charge title = not provided on this treatment date.   Manual:  TPDN YES Trigger Point Dry Needling  Subsequent Treatment: Instructions  reviewed, if requested by the patient, prior to subsequent dry needling treatment.   Patient Verbal Consent Given: Yes Education Handout Provided: Yes Muscles Treated: L Hamstrings 4x in lateral hamstring using a .30x50 needle Electrical Stimulation Performed: No Treatment Response/Outcome: Good twitch response   Needling performed by Lorayne Bender PT DPT STM to L HS, IASTM using roller There-ex: PROM L hip HS sets x10 Long sit HSS Gait with SPC in hall    Treatment                            11/26/23: Blank lines following charge title = not provided on this treatment date.   Manual:  TPDN YES Trigger Point Dry Needling  Subsequent Treatment: Instructions reviewed, if requested by the patient, prior to subsequent dry needling treatment.   Patient Verbal Consent Given: Yes Education Handout Provided: Yes Muscles Treated: L Hamstrings 4x in lateral hamstring using a .30x50 needle 3 time s in medial hamstring using a .30x50 needle.  Electrical Stimulation Performed: No Treatment Response/Outcome: Good twitch response   Needling performed by Lorayne Bender PT DPT  There-ex: PROM L hip HS sets  Standing HR/TR x20 Standing march x10 Gait Training Stair training with step to pattern x4 steps, single rail use for review at home Gait with FWW in hall  Trial of SPC gait in hall       [facility] Adult PT Treatment:                                                DATE: 11/19/23 Therapeutic Exercise: Ankle pumps x20 Quad set 2x10 Glute set x20 TA activation x20 cues for breath control  Bent knee fallouts (small arc) 3x8 cues for comfortable ROM and set up  Mini clam w/ pillow 2x8 cues for comfortable ROM and setup  HEP update + education/handout  Self Care: Education/discussion re: ADLs and mobility that may be irritating symptoms and appropriate  strategies to mitigate. Education on post op restrictions and protocol   [facility] Adult PT Treatment:                                                 DATE: 11/12/23 Therapeutic Exercise: Glute sets, quad sets, ankle pumps, TA activation; all in supine, cues for appropriate performance/setup  Self Care: Dressing inspection/reinforcement (adhesive coming loose medially), post op restrictions, safety w/ mobility, appropriate positioning, monitoring surgical site, appropriate ADL modifications for safety/comfort   PATIENT EDUCATION:  Education details: rationale for interventions, HEP  Person educated: Patient Education method: Explanation, Demonstration, Tactile cues, Verbal cues Education comprehension: verbalized understanding, returned demonstration, verbal cues required, tactile cues required, and needs further education     HOME EXERCISE PROGRAM: Access Code: [email or web address] URL: [email or web address] Date: 12/08/2023 Prepared by: Vernon Prey April Kirstie Peri  Exercises - Supine Transversus Abdominis Bracing - Hands on Stomach  - 3-4 x daily - 1 sets - 10-12 reps - Supine Isometric Hamstring Set  - 1-2 x daily - 7 x weekly - 1-2 sets - 10 reps - 3seconds hold - Seated Table Hamstring Stretch  - 2 x daily - 7 x weekly - 3 sets - 30seconds hold - Quadruped Alternating Arm Lift  - 1 x daily - 7 x weekly - 2 sets - 10 reps - Quadruped Alternating Leg Extensions  - 1 x daily - 7 x weekly - 2 sets - 10 reps - Clamshell with Resistance  - 1 x daily - 7 x weekly - 2 sets - 10 reps - Modified Single-Leg Deadlift  - 1 x daily - 7 x weekly - 2 sets - 10 reps  ASSESSMENT:  CLINICAL IMPRESSION: Continued TPDN - more tissue restriction noted along medial and upper hamstring today. Worked on glute activation and hip hinging to decrease strain/pain on hamstrings when pt bends over. Educated pt on performing gentle stretches and not to the point of her pain as she may be over stretching her hamstrings. Improving gait noted with less hip instability. Still has a mild lateral shift but less hip drop given cues to increase her  weightbearing through L LE during stance phase.   Per eval - Patient is a pleasant 48 y.o. woman who was seen today for physical therapy evaluation and treatment for L hip arthroscopic labral repair DOS 11/09/23. Today she presents with mobility deficits as expected post op and difficulty performing usual activities. On exam she appears to be doing well post op, did require education on safety w/ mobility and post op restrictions. Dressing reinforced but overall surgical site is well appearing. No concerning symptoms today. Tolerates HEP and exam well without adverse event. Recommend skilled PT to address aforementioned deficits with aim of improving functional tolerance and reducing pain with typical activities. Pt departs today's session in no acute distress, all voiced concerns/questions addressed appropriately from PT perspective.    OBJECTIVE IMPAIRMENTS: Abnormal gait, decreased activity tolerance, decreased endurance, decreased mobility, difficulty walking, decreased ROM, decreased strength, improper body mechanics, and pain.   ACTIVITY LIMITATIONS: carrying, lifting, bending, sitting, standing, squatting, sleeping, stairs, transfers, bed mobility, and locomotion level  PARTICIPATION LIMITATIONS: meal prep, cleaning, laundry, driving, shopping, community activity, and occupation  PERSONAL FACTORS: Time since onset of injury/illness/exacerbation and 3+ comorbidities: asthma, HTN, headaches  are also affecting patient's functional outcome.   REHAB POTENTIAL: Good  CLINICAL DECISION MAKING: Stable/uncomplicated  EVALUATION COMPLEXITY: Low   GOALS:  SHORT TERM GOALS: Target date: 12/24/2023  Pt will demonstrate appropriate understanding and performance of initially prescribed HEP in order to facilitate improved independence with management of symptoms.  Baseline: HEP established  Goal status: INITIAL   2. Pt will report at least 25% improvement in overall pain levels over past week in  order to facilitate improved tolerance to typical daily activities.   Baseline: 0-5/10  Goal status: INITIAL    LONG TERM GOALS: Target date: 02/04/2024   Pt will improve MCID or greater on LEFS in order to demonstrate improved perception of function due to symptoms (MCID 9 pts) Baseline: LEFS TBD Goal status: INITIAL  2.  Pt will demonstrate L hip AROM grossly WNL in order to facilitate improved tolerance to functional movements. Baseline: see ROM chart above Goal status: INITIAL  3.  Pt will demonstrate ability to navigate one flight of stairs safely w/o UE support and mechanics WNL in order to facilitate improved home navigation. Baseline: staying on main level Goal status: INITIAL  4.  Pt will be able to perform 5 consecutive sit to stands without pain and with mechanics grossly WNL in order to facilitate improved safety w/ transfers. Baseline: altered mechanics as expected Goal status: INITIAL    5. Pt will be able to ambulate community distances without limitation and with LRAD in order to facilitate improved community navigation and tolerance to occupational tasks.  Baseline: ambulating into clinic with mild pain, standard walker  Goal status: INITIAL    PLAN:  PT FREQUENCY: 1-2x/week  PT DURATION: 12 weeks  PLANNED INTERVENTIONS: 97164- PT Re-evaluation, 97110-Therapeutic exercises, 97530- Therapeutic activity, 97112- Neuromuscular re-education, 97535- Self Care, 40102- Manual therapy, [phone]- Gait training, [phone]- Aquatic Therapy, [phone]- Electrical stimulation (unattended), Patient/Family education, Balance training, Stair training, Taping, Dry Needling, Joint mobilization, Spinal mobilization, Scar mobilization, Cryotherapy, and Moist heat  PLAN FOR NEXT SESSION: Review/update HEP PRN. Progress through hip labral repair protocol as able/appropriate. Symptom modification strategies as indicated/appropriate.    [name], PT 12/08/2023 2:30 PM

## 2023-12-10 ENCOUNTER — Other Ambulatory Visit (HOSPITAL_COMMUNITY): Payer: Self-pay

## 2023-12-16 ENCOUNTER — Encounter (HOSPITAL_BASED_OUTPATIENT_CLINIC_OR_DEPARTMENT_OTHER): Admitting: Physical Therapy

## 2023-12-16 ENCOUNTER — Ambulatory Visit: Payer: 59 | Admitting: Family Medicine

## 2023-12-21 ENCOUNTER — Ambulatory Visit: Payer: 59 | Admitting: Family Medicine

## 2023-12-21 ENCOUNTER — Other Ambulatory Visit: Payer: Self-pay

## 2023-12-21 ENCOUNTER — Ambulatory Visit (INDEPENDENT_AMBULATORY_CARE_PROVIDER_SITE_OTHER): Admitting: Orthopaedic Surgery

## 2023-12-21 ENCOUNTER — Other Ambulatory Visit: Payer: Self-pay | Admitting: Family Medicine

## 2023-12-21 ENCOUNTER — Other Ambulatory Visit (HOSPITAL_COMMUNITY): Payer: Self-pay

## 2023-12-21 VITALS — BP 128/88 | HR 65 | Temp 98.3°F | Ht 63.5 in | Wt 181.3 lb

## 2023-12-21 DIAGNOSIS — M25552 Pain in left hip: Secondary | ICD-10-CM | POA: Diagnosis not present

## 2023-12-21 DIAGNOSIS — I10 Essential (primary) hypertension: Secondary | ICD-10-CM

## 2023-12-21 DIAGNOSIS — R7303 Prediabetes: Secondary | ICD-10-CM | POA: Insufficient documentation

## 2023-12-21 LAB — POCT GLYCOSYLATED HEMOGLOBIN (HGB A1C): Hemoglobin A1C: 5.6 % (ref 4.0–5.6)

## 2023-12-21 MED ORDER — LIDOCAINE HCL 1 % IJ SOLN
4.0000 mL | INTRAMUSCULAR | Status: AC | PRN
  Administered 2023-12-21: 4 mL

## 2023-12-21 MED ORDER — TRIAMCINOLONE ACETONIDE 40 MG/ML IJ SUSP
80.0000 mg | INTRAMUSCULAR | Status: AC | PRN
  Administered 2023-12-21: 80 mg via INTRA_ARTICULAR

## 2023-12-21 MED ORDER — OLMESARTAN MEDOXOMIL 20 MG PO TABS
20.0000 mg | ORAL_TABLET | Freq: Every day | ORAL | 1 refills | Status: DC
Start: 2023-12-21 — End: 2024-06-27
  Filled 2023-12-21 – 2024-02-18 (×4): qty 90, 90d supply, fill #0
  Filled 2024-06-15: qty 90, 90d supply, fill #1

## 2023-12-21 MED ORDER — GABAPENTIN 300 MG PO CAPS
300.0000 mg | ORAL_CAPSULE | Freq: Three times a day (TID) | ORAL | 1 refills | Status: DC | PRN
  Filled 2023-12-21: qty 90, 30d supply, fill #0
  Filled 2024-03-07: qty 90, 30d supply, fill #1

## 2023-12-21 NOTE — Assessment & Plan Note (Signed)
 A1C back down in the normal range with dietary changes. I congratulated pt and encouraged her to continue her changes. RTC every 6 months for A1C monitoring.

## 2023-12-21 NOTE — Telephone Encounter (Signed)
 Last OV 08/31/23 Next OV not scheduled  Last refill 01/21/23 Qty # 90/3

## 2023-12-21 NOTE — Progress Notes (Signed)
 Established Patient Office Visit  Subjective   Patient ID: [name], female    DOB: [birth year]  Age: 48 y.o. MRN: [id number]  Chief Complaint  Patient presents with   Medical Management of Chronic Issues    Pt is here for follow up today on HTN and prediabetes.  HTN -- BP in office performed and is well controlled. She  reports no side effects to the medications, no chest pain, SOB, dizziness or headaches. She has a BP cuff at home and is checking BP regularly, reports they are in the normal range.    Prediabetes -- A1C is back in the normal range today. Pt reports she gave up regular sodas and this change has greatly improved her blood sugars.     Current Outpatient Medications  Medication Instructions   acetaminophen  (TYLENOL ) 1,000 mg, Every 6 hours PRN   amLODipine  (NORVASC ) 10 mg, Oral, Daily   aspirin  EC 325 mg, Oral, Daily   aspirin -acetaminophen -caffeine (EXCEDRIN MIGRAINE) 250-250-65 MG tablet 1 tablet, Every 6 hours PRN   celecoxib  (CELEBREX ) 200 mg, Oral, 2 times daily PRN   chlorthalidone  (HYGROTON ) 25 mg, Oral, Daily   gabapentin  (NEURONTIN ) 300 mg, Oral, 3 times daily PRN   HYDROcodone -acetaminophen  (NORCO/VICODIN) 5-325 MG tablet 1 tablet, Oral, Every 6 hours PRN   levocetirizine (XYZAL ) 5 mg, Oral, Every evening   levonorgestrel (MIRENA) 20 MCG/DAY IUD 1 each,  Once   montelukast  (SINGULAIR ) 10 mg, Oral, Daily at bedtime   olmesartan  (BENICAR ) 20 mg, Oral, Daily   OVER THE COUNTER MEDICATION Glucosamine chondroitin with vitamin D -once a day   oxyCODONE  (ROXICODONE ) 5 mg, Oral, Every 4 hours PRN   tiZANidine  (ZANAFLEX ) 2-4 mg, Oral, Every 8 hours PRN    Patient Active Problem List   Diagnosis Date Noted   Prediabetes 12/21/2023   Tear of left acetabular labrum 11/09/2023   Vitamin D  deficiency 03/31/2022   Chronic cough 09/27/2018   Cervical spondylosis 07/11/2018   Lymphocytic colitis 05/21/2018   Anxiety state 04/29/2016   Atypical chest pain  02/22/2016   Dysphagia, pharyngoesophageal phase 02/22/2016   Physical exam 12/15/2014   Left hip pain 09/29/2014   Radicular low back pain 09/11/2014   Bilateral hip bursitis 09/11/2014   BPV (benign positional vertigo) 02/22/2014   HTN (hypertension) 02/22/2014      Review of Systems  All other systems reviewed and are negative.     Objective:     BP 128/88   Pulse 65   Temp 98.3 F (36.8 C) (Oral)   Ht 5' 3.5" (1.613 m)   Wt 181 lb 4.8 oz (82.2 kg)   SpO2 98%   BMI 31.61 kg/m    Physical Exam Vitals reviewed.  Constitutional:      Appearance: Normal appearance. She is well-groomed and normal weight.  Eyes:     Conjunctiva/sclera: Conjunctivae normal.  Neck:     Thyroid : No thyromegaly.  Cardiovascular:     Rate and Rhythm: Normal rate and regular rhythm.     Pulses: Normal pulses.     Heart sounds: S1 normal and S2 normal.  Pulmonary:     Effort: Pulmonary effort is normal.     Breath sounds: Normal breath sounds and air entry.  Abdominal:     General: Bowel sounds are normal.  Musculoskeletal:     Right lower leg: No edema.     Left lower leg: No edema.  Neurological:     Mental Status: She is alert and oriented to  person, place, and time. Mental status is at baseline.     Gait: Gait is intact.  Psychiatric:        Mood and Affect: Mood and affect normal.        Speech: Speech normal.        Behavior: Behavior normal.        Judgment: Judgment normal.      Results for orders placed or performed in visit on 12/21/23  POC HgB A1c  Result Value Ref Range   Hemoglobin A1C 5.6 4.0 - 5.6 %   HbA1c POC (<> result, manual entry)     HbA1c, POC (prediabetic range)     HbA1c, POC (controlled diabetic range)        The 10-year ASCVD risk score (Arnett DK, et al., 2019) is: 3.3%    Assessment & Plan:  Prediabetes Assessment & Plan: A1C back down in the normal range with dietary changes. I congratulated pt and encouraged her to continue her  changes. RTC every 6 months for A1C monitoring.   Orders: -     POCT glycosylated hemoglobin (Hb A1C) -     Collection capillary blood specimen  Primary hypertension Assessment & Plan: Current hypertension medications:       Sig   amLODipine  (NORVASC ) 10 MG tablet (Taking) Take 1 tablet (10 mg total) by mouth daily.   chlorthalidone  (HYGROTON ) 25 MG tablet (Taking) Take 1 tablet (25 mg total) by mouth daily.   olmesartan  (BENICAR ) 20 MG tablet Take 1 tablet (20 mg total) by mouth daily.      BP is well controlled today. I refilled her BP medications listed above.   Orders: -     Olmesartan  Medoxomil; Take 1 tablet (20 mg total) by mouth daily.  Dispense: 90 tablet; Refill: 1     Return in about 6 months (around 06/21/2024) for annual physical with pap smear -- fasting for annual blood work also.    Aida House, MD

## 2023-12-21 NOTE — Patient Instructions (Signed)
[place]ro[place]  Black cohosh

## 2023-12-21 NOTE — Assessment & Plan Note (Signed)
 Current hypertension medications:       Sig   amLODipine  (NORVASC ) 10 MG tablet (Taking) Take 1 tablet (10 mg total) by mouth daily.   chlorthalidone  (HYGROTON ) 25 MG tablet (Taking) Take 1 tablet (25 mg total) by mouth daily.   olmesartan  (BENICAR ) 20 MG tablet Take 1 tablet (20 mg total) by mouth daily.      BP is well controlled today. I refilled her BP medications listed above.

## 2023-12-21 NOTE — Progress Notes (Signed)
 Post Operative Evaluation    Procedure/Date of Surgery: Left hip labral repair 3/10  Interval History:   Presents today 6   weeks status post left hip labral repair her hamstring is greatly improved at today's visit.  She has having some soreness about the glue and tenderness about the lateral trochanteric insertion as she has been rehabbing.  This has been somewhat limiting her ability to rehab   PMH/PSH/Family History/Social History/Meds/Allergies:    Past Medical History:  Diagnosis Date  . Arthritis    right foot  . Asthma   . Congenital flat foot    has had bilateral foot surgery to correct  . Frequent headaches   . GERD (gastroesophageal reflux disease)   . Hypertension   . Irritable bowel syndrome (IBS)    no current med.  . Painful orthopaedic hardware [facility]) 03/2013   right foot   Past Surgical History:  Procedure Laterality Date  . CALCANEAL OSTEOTOMY Right 12/23/2012   Procedure: RIGHT CALCANEAL OSTEOTOMY, RIGHT LATERAL COLUMN LENGTHENING, RIGHT COTTON OSTEOTOMY, RIGHT FLEX DIGITORIUM LONGUS TRANSFER, RIGHT KIDNER PROCEDURE ;  Surgeon: Amada Backer, MD;  Location: [facility];  Service: Orthopedics;  Laterality: Right;  . CESAREAN SECTION  07/13/2005; 06/03/2006  . DILATION AND EVACUATION  08/16/2008  . FOOT SURGERY Left 2002  . FOOT SURGERY Right 10/2018  . GASTROCNEMIUS RECESSION Right 12/23/2012   Procedure: RIGHT GASTRO RECESSION ;  Surgeon: Amada Backer, MD;  Location: [facility];  Service: Orthopedics;  Laterality: Right;  . HARDWARE REMOVAL Right 03/24/2013   Procedure: HARDWARE REMOVAL RIGHT HEEL;  Surgeon: Amada Backer, MD;  Location: [facility];  Service: Orthopedics;  Laterality: Right;  . INTRAUTERINE DEVICE INSERTION    . WISDOM TOOTH EXTRACTION     Social History   Socioeconomic History  . Marital status: Married    Spouse name: Not on file  . Number of children: 3  .  Years of education: Not on file  . Highest education level: Bachelor's degree (e.g., BA, AB, BS)  Occupational History  . Occupation: Teacher, adult education: [employer]  Tobacco Use  . Smoking status: Never  . Smokeless tobacco: Never  Vaping Use  . Vaping status: Never Used  Substance and Sexual Activity  . Alcohol use: No  . Drug use: No  . Sexual activity: Yes    Birth control/protection: I.U.D.  Other Topics Concern  . Not on file  Social History Narrative  . Not on file   Social Drivers of Health   Financial Resource Strain: Low Risk  (12/21/2023)   Overall Financial Resource Strain (CARDIA)   . Difficulty of Paying Living Expenses: Not very hard  Food Insecurity: No Food Insecurity (12/21/2023)   Hunger Vital Sign   . Worried About Programme researcher, broadcasting/film/video in the Last Year: Never true   . Ran Out of Food in the Last Year: Never true  Transportation Needs: No Transportation Needs (12/21/2023)   PRAPARE - Transportation   . Lack of Transportation (Medical): No   . Lack of Transportation (Non-Medical): No  Physical Activity: Unknown (12/21/2023)   Exercise Vital Sign   . Days of Exercise per Week: 0 days   . Minutes of Exercise per Session: Not on file  Stress: Stress Concern Present (12/21/2023)  [employer] of Occupational Health - Occupational Stress Questionnaire   . Feeling of Stress : Very much  Social Connections: Socially Integrated (12/21/2023)   Social Connection and Isolation Panel [NHANES]   . Frequency of Communication with Friends and Family: More than three times a week   . Frequency of Social Gatherings with Friends and Family: More than three times a week   . Attends Religious Services: More than 4 times per year   . Active Member of Clubs or Organizations: Yes   . Attends Banker Meetings: More than 4 times per year   . Marital Status: Married   Family History  Problem Relation Age of Onset  . Hypertension Mother   . Hypertension Father   .  Diabetes Father   . Irritable bowel syndrome Father   . Arrhythmia Father   . Breast cancer Paternal Grandmother   . Colon cancer Cousin        maternal  . Rectal cancer Neg Hx   . Esophageal cancer Neg Hx   . Stomach cancer Neg Hx    Allergies  Allergen Reactions  . Augmentin  [Amoxicillin -Pot Clavulanate] Nausea And Vomiting  . Biotin Other (See Comments)    Severe yeast infection  . Sulfa Antibiotics Other (See Comments)    Reaction:  Muscle pain    Current Outpatient Medications  Medication Sig Dispense Refill  . acetaminophen  (TYLENOL ) 500 MG tablet Take 1,000 mg by mouth every 6 (six) hours as needed.    . amLODipine  (NORVASC ) 10 MG tablet Take 1 tablet (10 mg total) by mouth daily. 90 tablet 1  . aspirin  EC 325 MG tablet Take 1 tablet (325 mg total) by mouth daily. 14 tablet 0  . aspirin -acetaminophen -caffeine (EXCEDRIN MIGRAINE) 250-250-65 MG tablet Take 1 tablet by mouth every 6 (six) hours as needed for headache.    . celecoxib  (CELEBREX ) 200 MG capsule Take 1 capsule (200 mg total) by mouth 2 (two) times daily as needed. 60 capsule 5  . chlorthalidone  (HYGROTON ) 25 MG tablet Take 1 tablet (25 mg total) by mouth daily. 90 tablet 1  . gabapentin  (NEURONTIN ) 300 MG capsule Take 1 capsule (300 mg total) by mouth 3 (three) times daily as needed. 90 capsule 3  . HYDROcodone -acetaminophen  (NORCO/VICODIN) 5-325 MG tablet Take 1 tablet by mouth every 6 (six) hours as needed. (Patient not taking: Reported on 11/02/2023) 15 tablet 0  . levocetirizine (XYZAL ) 5 MG tablet Take 1 tablet (5 mg total) by mouth every evening. 90 tablet 1  . levonorgestrel (MIRENA) 20 MCG/DAY IUD 1 each by Intrauterine route once.    . montelukast  (SINGULAIR ) 10 MG tablet Take 1 tablet (10 mg total) by mouth at bedtime. 90 tablet 1  . olmesartan  (BENICAR ) 20 MG tablet Take 0.5 tablets (10 mg total) by mouth daily. 90 tablet 1  . oxyCODONE  (ROXICODONE ) 5 MG immediate release tablet Take 1 tablet (5 mg total) by  mouth every 4 (four) hours as needed for severe pain (pain score 7-10) or breakthrough pain. 10 tablet 0  . tiZANidine  (ZANAFLEX ) 2 MG tablet Take 1-2 tablets (2-4 mg total) by mouth every 8 (eight) hours as needed. 60 tablet 0   No current facility-administered medications for this visit.   No results found.  Review of Systems:   A ROS was performed including pertinent positives and negatives as documented in the HPI.   Musculoskeletal Exam:    There were no vitals taken for this visit.  Left hip incisions are  well-appearing without erythema or drainage.  Range of motion 30 degrees internal/external rotation of the left hip.  Tenderness about the greater trochanter at the gluteus insertion  Imaging:      I personally reviewed and interpreted the radiographs.   Assessment:   6-week status post left hip labral repair.  She is doing much better at today's visit with improvement in her hamstring symptoms.  She has having some gluteus insertional tendinitis for which I recommended ultrasound-guided injection to help her progress through rehab  Plan :    - Return to clinic 6 weeks for reassessment     Procedure Note  Patient: [name]             Date of Birth: July 25, 1976           MRN: [id number]             Visit Date: 12/21/2023  Procedures: Visit Diagnoses: No diagnosis found.  Large Joint Inj: L greater trochanter on 12/21/2023 9:22 AM Indications: pain Details: 22 G 3.5 in needle, ultrasound-guided anterolateral approach  Arthrogram: No  Medications: 4 mL lidocaine  1 %; 80 mg triamcinolone  acetonide 40 MG/ML Outcome: tolerated well, no immediate complications Procedure, treatment alternatives, risks and benefits explained, specific risks discussed. Consent was given by the patient. Immediately prior to procedure a time out was called to verify the correct patient, procedure, equipment, support staff and site/side marked as required. Patient was prepped and  draped in the usual sterile fashion.         I personally saw and evaluated the patient, and participated in the management and treatment plan.  Wilhelmenia Harada, MD Attending Physician, Orthopedic Surgery  This document was dictated using Dragon voice recognition software. A reasonable attempt at proof reading has been made to minimize errors.

## 2023-12-22 ENCOUNTER — Other Ambulatory Visit (HOSPITAL_COMMUNITY): Payer: Self-pay

## 2023-12-22 ENCOUNTER — Encounter: Payer: Self-pay | Admitting: Pharmacist

## 2023-12-22 ENCOUNTER — Other Ambulatory Visit: Payer: Self-pay

## 2023-12-23 ENCOUNTER — Encounter (HOSPITAL_BASED_OUTPATIENT_CLINIC_OR_DEPARTMENT_OTHER): Payer: Self-pay | Admitting: Physical Therapy

## 2023-12-23 ENCOUNTER — Ambulatory Visit (HOSPITAL_BASED_OUTPATIENT_CLINIC_OR_DEPARTMENT_OTHER): Admitting: Physical Therapy

## 2023-12-23 DIAGNOSIS — R2689 Other abnormalities of gait and mobility: Secondary | ICD-10-CM | POA: Diagnosis not present

## 2023-12-23 DIAGNOSIS — M25552 Pain in left hip: Secondary | ICD-10-CM

## 2023-12-23 DIAGNOSIS — M6281 Muscle weakness (generalized): Secondary | ICD-10-CM

## 2023-12-23 DIAGNOSIS — R262 Difficulty in walking, not elsewhere classified: Secondary | ICD-10-CM | POA: Diagnosis not present

## 2023-12-23 NOTE — Therapy (Signed)
 OUTPATIENT PHYSICAL THERAPY TREATMENT   Patient Name: [name] MRN: [id number] DOB:[birth year], 48 y.o., female Today's Date: 12/23/2023  END OF SESSION:  PT End of Session - 12/23/23 1429     Visit Number 6    Number of Visits 24    Date for PT Re-Evaluation 02/04/24    Authorization Type [facility] [employer]    PT Start Time 1146    PT Stop Time 1228    PT Time Calculation (min) 42 min    Activity Tolerance Patient tolerated treatment well;No increased pain    Behavior During Therapy WFL for tasks assessed/performed               Past Medical History:  Diagnosis Date   Arthritis    right foot   Asthma    Congenital flat foot    has had bilateral foot surgery to correct   Frequent headaches    GERD (gastroesophageal reflux disease)    Hypertension    Irritable bowel syndrome (IBS)    no current med.   Painful orthopaedic hardware [facility]) 03/2013   right foot   Past Surgical History:  Procedure Laterality Date   CALCANEAL OSTEOTOMY Right 12/23/2012   Procedure: RIGHT CALCANEAL OSTEOTOMY, RIGHT LATERAL COLUMN LENGTHENING, RIGHT COTTON OSTEOTOMY, RIGHT FLEX DIGITORIUM LONGUS TRANSFER, RIGHT KIDNER PROCEDURE ;  Surgeon: Amada Backer, MD;  Location: [facility];  Service: Orthopedics;  Laterality: Right;   CESAREAN SECTION  07/13/2005; 06/03/2006   DILATION AND EVACUATION  08/16/2008   FOOT SURGERY Left 2002   FOOT SURGERY Right 10/2018   GASTROCNEMIUS RECESSION Right 12/23/2012   Procedure: RIGHT GASTRO RECESSION ;  Surgeon: Amada Backer, MD;  Location: [facility];  Service: Orthopedics;  Laterality: Right;   HARDWARE REMOVAL Right 03/24/2013   Procedure: HARDWARE REMOVAL RIGHT HEEL;  Surgeon: Amada Backer, MD;  Location: [facility];  Service: Orthopedics;  Laterality: Right;   INTRAUTERINE DEVICE INSERTION     WISDOM TOOTH EXTRACTION     Patient Active Problem List   Diagnosis Date Noted   Prediabetes 12/21/2023   Tear of left  acetabular labrum 11/09/2023   Vitamin D  deficiency 03/31/2022   Chronic cough 09/27/2018   Cervical spondylosis 07/11/2018   Lymphocytic colitis 05/21/2018   Anxiety state 04/29/2016   Atypical chest pain 02/22/2016   Dysphagia, pharyngoesophageal phase 02/22/2016   Physical exam 12/15/2014   Left hip pain 09/29/2014   Radicular low back pain 09/11/2014   Bilateral hip bursitis 09/11/2014   BPV (benign positional vertigo) 02/22/2014   HTN (hypertension) 02/22/2014    PCP: Aida House, MD  REFERRING PROVIDER: Wilhelmenia Harada, MD  REFERRING DIAG: [phone] (ICD-10-CM) - Tear of left acetabular labrum, subsequent encounter  THERAPY DIAG:  Left hip pain  Difficulty walking  Other abnormalities of gait and mobility  Muscle weakness (generalized)  Rationale for Evaluation and Treatment: Rehabilitation  ONSET DATE: 11/09/23 L hip scope with labral repair  SUBJECTIVE:   4/23 Pt says the hamstring is feeling a lot better. Only major issues she has been having have occurred when she crosses her leg over to put her sock on.     Per eval - Pt states since surgery she has been doing pretty well, feels pain is a bit better than she expected. Has been using [place] quite a bit. Pt states she was told she could bear weight with walker. She does mention she has had pain with lower body dressing, particularly getting shoes  on/off. States she hasn't performed any exercises yet for the most part but did try to do SLR, stopped this due to pain (advised her to defer this movement given post op restrictions).  Has historically been quite active.  No distal pain, N/T. No redness/warmth.   SUBJECTIVE STATEMENT: 12/23/2023 Pt states her hamstring pain is improving. Feeling it most when she bends over. Feels that dry needling did help. Some soreness in anterior/lateral L hip.   PERTINENT HISTORY: asthma, GERD, headaches, HTN, IBS, BPPV PAIN:  Are you having pain: 3-4/10 L hamstring  Per  eval -  Location/description:L hip anterior/lateral Best-worst over past week: 0-5/10  - aggravating factors: movement, especially lower body dressing/hygiene - Easing factors: icing    PRECAUTIONS: hip labral repair protocol  RED FLAGS: None   WEIGHT BEARING RESTRICTIONS: Per op note WBAT LLE  FALLS:  Has patient fallen in last 6 months? No  LIVING ENVIRONMENT: 2 story home bed/bath downstairs, lives w/ spouse and kids (18 and 54). Laundry upstairs . 1STE Currently staying with parents, staying on lower level. 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  DIAGNOSTIC FINDINGS:  DOS 11/09/23 L hip arthroscopic labral repair  PATIENT SURVEYS:  LEFS 25/80  11/19/23  COGNITION: Overall cognitive status: Within functional limits for tasks assessed     SENSATION: Denies sensory complaints  EDEMA/INSPECTION:  Dressing appears clean and intact, no significant redness or swelling about surgical site. No apparent drainage/bleeding.    LOWER EXTREMITY ROM:  ROM Right eval Left eval  Hip flexion    Hip extension    Hip abduction    Hip adduction    Hip internal rotation    Hip external rotation    Knee flexion    Knee extension    Ankle dorsiflexion    Ankle plantarflexion    Ankle inversion    Ankle eversion     (Blank rows = not tested) (Key: WFL = within functional limits not formally assessed, * = concordant pain, s = stiffness/stretching sensation, NT = not tested) Comments: PROM only on eval; flex/ER/IR painless, maintaining ROM within surgical protocol  LOWER EXTREMITY MMT:  MMT Right eval  Left eval  Hip flexion    Hip extension    Hip abduction    Hip adduction    Hip internal rotation    Hip external rotation    Knee flexion    Knee extension    Ankle dorsiflexion    Ankle plantarflexion    Ankle inversion    Ankle eversion     (Blank rows = not tested) (Key: WFL = within functional limits not formally assessed, * = concordant pain, s = stiffness/stretching sensation, NT = not tested) Comment: deferred on eval given proximity to surgery  FUNCTIONAL TESTS:  Sit to stand: BIL UE support, reduced WB through surgical limb, inc time/effort   GAIT: Distance walked: within clinic Assistive device utilized:  standard walker Level of assistance: Modified independence Comments: step to pattern, increased distance from RW, reduced gait speed/cadence, reduced WB through surgical limb as expected  TREATMENT DATE:  4/23 Manual: All PROM performed with distraction to reduce pain and improve movement Grade 1&2 inferior glide STM to anterior hip and reviewed self STM to anterior hip There-ex: Clamshell 3x10 red RTB Bridge 3x10 red RTB Prone ER/IR  There-Act Step ups 3x10 Mini squat 3x10  Gait 342ft with SPC    Treatment                            12/08/23: Blank lines following charge title = not provided on this treatment date.   Manual:  TPDN YES Trigger Point Dry Needling  Subsequent Treatment: Instructions reviewed, if requested by the patient, prior to subsequent dry needling treatment.   Patient Verbal Consent Given: Yes Education Handout Provided: Yes Muscles Treated: L Hamstrings 2x in medial hamstring using a .30x50 needle Electrical Stimulation Performed: No Treatment Response/Outcome: Good twitch response   Needling performed by Signa Drier PT DPT STM to L HS, IASTM using  roller  There-ex: Recumbent bike L3 x 6 min Seated HS stretch x30" with knees straight and then knees bent Hip hinge into L stretch for hamstring stretch in standing Clamshell yellow TB 2x10  Theract: Staggered stance deadlift x10 Gait with SPC in hall Qped donkey kick 2x10 yellow TB Qped alternating UE raise 2x10 Half kneel to tall kneel 2x10   Treatment                            12/03/23: Blank lines following charge title = not provided on this treatment date.   Manual:  TPDN YES Trigger Point Dry Needling  Subsequent Treatment: Instructions reviewed, if requested by the patient, prior to subsequent dry needling treatment.   Patient Verbal Consent Given: Yes Education Handout Provided: Yes Muscles Treated: L Hamstrings 4x in lateral hamstring using a .30x50 needle Electrical Stimulation Performed: No Treatment Response/Outcome: Good twitch response   Needling performed by Signa Drier PT DPT STM to L HS, IASTM using roller There-ex: PROM L hip HS sets x10 Long sit HSS Gait with SPC in hall    Treatment                            11/26/23: Blank lines following charge title = not provided on this treatment date.   Manual:  TPDN YES Trigger Point Dry Needling  Subsequent Treatment: Instructions reviewed, if requested by the patient, prior to subsequent dry needling treatment.   Patient Verbal Consent Given: Yes Education Handout Provided: Yes Muscles Treated: L Hamstrings 4x in lateral hamstring using a .30x50 needle 3 time s in medial hamstring using a .30x50 needle.  Electrical Stimulation Performed: No Treatment Response/Outcome: Good twitch response   Needling performed by Signa Drier PT DPT  There-ex: PROM L hip HS sets  Standing HR/TR x20 Standing march x10 Gait Training Stair training with step to pattern x4 steps, single rail use for review at home Gait with FWW in hall  Trial of SPC gait in hall       [facility] Adult PT Treatment:                                                 DATE: 11/19/23 Therapeutic Exercise: Ankle  pumps x20 Quad set 2x10 Glute set x20 TA activation x20 cues for breath control  Bent knee fallouts (small arc) 3x8 cues for comfortable ROM and set up  Mini clam w/ pillow 2x8 cues for comfortable ROM and setup  HEP update + education/handout  Self Care: Education/discussion re: ADLs and mobility that may be irritating symptoms and appropriate strategies to mitigate. Education on post op restrictions and protocol   [facility] Adult PT Treatment:                                                DATE: 11/12/23 Therapeutic Exercise: Glute sets, quad sets, ankle pumps, TA activation; all in supine, cues for appropriate performance/setup  Self Care: Dressing inspection/reinforcement (adhesive coming loose medially), post op restrictions, safety w/ mobility, appropriate positioning, monitoring surgical site, appropriate ADL modifications for safety/comfort   PATIENT EDUCATION:  Education details: rationale for interventions, HEP  Person educated: Patient Education method: Explanation, Demonstration, Tactile cues, Verbal cues Education comprehension: verbalized understanding, returned demonstration, verbal cues required, tactile cues required, and needs further education     HOME EXERCISE PROGRAM: Access Code: [email or web address] URL: [email or web address] Date: 12/08/2023 Prepared by: Gellen April Erman [place]  Exercises - Supine Transversus Abdominis Bracing - Hands on Stomach  - 3-4 x daily - 1 sets - 10-12 reps - Supine Isometric Hamstring Set  - 1-2 x daily - 7 x weekly - 1-2 sets - 10 reps - 3seconds hold - Seated Table Hamstring Stretch  - 2 x daily - 7 x weekly - 3 sets - 30seconds hold - Quadruped Alternating Arm Lift  - 1 x daily - 7 x weekly - 2 sets - 10 reps - Quadruped Alternating Leg Extensions  - 1 x daily - 7 x weekly - 2 sets - 10 reps - Clamshell with Resistance  - 1 x daily - 7 x weekly -  2 sets - 10 reps - Modified Single-Leg Deadlift  - 1 x daily - 7 x weekly - 2 sets - 10 reps  ASSESSMENT:  CLINICAL IMPRESSION: 4/23 Pt warmed up on the exercise bike with no increase in symptoms. Manual therapy was performed noting decreased sensitivity and stiffness in the hamstring and hip region. All activities were performed with proper cueing for body mechanics, abdominal bracing, and posture. We finished today's session with gait training educating on proper mechanics with SPC. Pt will continue to benefit from skilled physical therapy to progress MD protocol for return to functional ADL's.   Per eval - Patient is a pleasant 48 y.o. woman who was seen today for physical therapy evaluation and treatment for L hip arthroscopic labral repair DOS 11/09/23. Today she presents with mobility deficits as expected post op and difficulty performing usual activities. On exam she appears to be doing well post op, did require education on safety w/ mobility and post op restrictions. Dressing reinforced but overall surgical site is well appearing. No concerning symptoms today. Tolerates HEP and exam well without adverse event. Recommend skilled PT to address aforementioned deficits with aim of improving functional tolerance and reducing pain with typical activities. Pt departs today's session in no acute distress, all voiced concerns/questions addressed appropriately from PT perspective.    OBJECTIVE IMPAIRMENTS: Abnormal gait, decreased activity tolerance, decreased endurance, decreased mobility, difficulty walking, decreased ROM, decreased strength, improper body mechanics, and pain.   ACTIVITY LIMITATIONS:  carrying, lifting, bending, sitting, standing, squatting, sleeping, stairs, transfers, bed mobility, and locomotion level  PARTICIPATION LIMITATIONS: meal prep, cleaning, laundry, driving, shopping, community activity, and occupation  PERSONAL FACTORS: Time since onset of injury/illness/exacerbation and  3+ comorbidities: asthma, HTN, headaches  are also affecting patient's functional outcome.   REHAB POTENTIAL: Good  CLINICAL DECISION MAKING: Stable/uncomplicated  EVALUATION COMPLEXITY: Low   GOALS:  SHORT TERM GOALS: Target date: 12/24/2023  Pt will demonstrate appropriate understanding and performance of initially prescribed HEP in order to facilitate improved independence with management of symptoms.  Baseline: HEP established  Goal status: INITIAL   2. Pt will report at least 25% improvement in overall pain levels over past week in order to facilitate improved tolerance to typical daily activities.   Baseline: 0-5/10  Goal status: INITIAL    LONG TERM GOALS: Target date: 02/04/2024   Pt will improve MCID or greater on LEFS in order to demonstrate improved perception of function due to symptoms (MCID 9 pts) Baseline: LEFS TBD Goal status: INITIAL  2.  Pt will demonstrate L hip AROM grossly WNL in order to facilitate improved tolerance to functional movements. Baseline: see ROM chart above Goal status: INITIAL  3.  Pt will demonstrate ability to navigate one flight of stairs safely w/o UE support and mechanics WNL in order to facilitate improved home navigation. Baseline: staying on main level Goal status: INITIAL  4.  Pt will be able to perform 5 consecutive sit to stands without pain and with mechanics grossly WNL in order to facilitate improved safety w/ transfers. Baseline: altered mechanics as expected Goal status: INITIAL    5. Pt will be able to ambulate community distances without limitation and with LRAD in order to facilitate improved community navigation and tolerance to occupational tasks.  Baseline: ambulating into clinic with mild pain, standard walker  Goal status: INITIAL    PLAN:  PT FREQUENCY: 1-2x/week  PT DURATION: 12 weeks  PLANNED INTERVENTIONS: 97164- PT Re-evaluation, 97110-Therapeutic exercises, 97530- Therapeutic activity, 97112-  Neuromuscular re-education, 97535- Self Care, 02725- Manual therapy, [phone]- Gait training, [phone]- Aquatic Therapy, [phone]- Electrical stimulation (unattended), Patient/Family education, Balance training, Stair training, Taping, Dry Needling, Joint mobilization, Spinal mobilization, Scar mobilization, Cryotherapy, and Moist heat  PLAN FOR NEXT SESSION: Review/update HEP PRN. Progress through hip labral repair protocol as able/appropriate. Symptom modification strategies as indicated/appropriate.    Kitty Perkins, PT 12/23/2023 4:25 PM

## 2023-12-31 ENCOUNTER — Ambulatory Visit (HOSPITAL_BASED_OUTPATIENT_CLINIC_OR_DEPARTMENT_OTHER): Attending: Orthopaedic Surgery | Admitting: Physical Therapy

## 2023-12-31 DIAGNOSIS — R262 Difficulty in walking, not elsewhere classified: Secondary | ICD-10-CM | POA: Diagnosis not present

## 2023-12-31 DIAGNOSIS — M25552 Pain in left hip: Secondary | ICD-10-CM | POA: Insufficient documentation

## 2023-12-31 DIAGNOSIS — M6281 Muscle weakness (generalized): Secondary | ICD-10-CM | POA: Diagnosis not present

## 2023-12-31 DIAGNOSIS — R2689 Other abnormalities of gait and mobility: Secondary | ICD-10-CM | POA: Diagnosis not present

## 2023-12-31 NOTE — Therapy (Signed)
 OUTPATIENT PHYSICAL THERAPY TREATMENT   Patient Name: [name] MRN: [id number] DOB:1976-05-25, 48 y.o., female Today's Date: 12/31/2023  END OF SESSION:  PT End of Session - 12/31/23 1101     Visit Number 7    Number of Visits 24    Date for PT Re-Evaluation 02/04/24    Authorization Type [facility] [employer]    PT Start Time 1101    PT Stop Time 1140    PT Time Calculation (min) 39 min    Activity Tolerance Patient tolerated treatment well;No increased pain    Behavior During Therapy WFL for tasks assessed/performed              Past Medical History:  Diagnosis Date   Arthritis    right foot   Asthma    Congenital flat foot    has had bilateral foot surgery to correct   Frequent headaches    GERD (gastroesophageal reflux disease)    Hypertension    Irritable bowel syndrome (IBS)    no current med.   Painful orthopaedic hardware [facility]) 03/2013   right foot   Past Surgical History:  Procedure Laterality Date   CALCANEAL OSTEOTOMY Right 12/23/2012   Procedure: RIGHT CALCANEAL OSTEOTOMY, RIGHT LATERAL COLUMN LENGTHENING, RIGHT COTTON OSTEOTOMY, RIGHT FLEX DIGITORIUM LONGUS TRANSFER, RIGHT KIDNER PROCEDURE ;  Surgeon: Amada Backer, MD;  Location: [facility];  Service: Orthopedics;  Laterality: Right;   CESAREAN SECTION  07/13/2005; 06/03/2006   DILATION AND EVACUATION  08/16/2008   FOOT SURGERY Left 2002   FOOT SURGERY Right 10/2018   GASTROCNEMIUS RECESSION Right 12/23/2012   Procedure: RIGHT GASTRO RECESSION ;  Surgeon: Amada Backer, MD;  Location: [facility];  Service: Orthopedics;  Laterality: Right;   HARDWARE REMOVAL Right 03/24/2013   Procedure: HARDWARE REMOVAL RIGHT HEEL;  Surgeon: Amada Backer, MD;  Location: [facility];  Service: Orthopedics;  Laterality: Right;   INTRAUTERINE DEVICE INSERTION     WISDOM TOOTH EXTRACTION     Patient Active Problem List   Diagnosis Date Noted   Prediabetes 12/21/2023   Tear of left  acetabular labrum 11/09/2023   Vitamin D  deficiency 03/31/2022   Chronic cough 09/27/2018   Cervical spondylosis 07/11/2018   Lymphocytic colitis 05/21/2018   Anxiety state 04/29/2016   Atypical chest pain 02/22/2016   Dysphagia, pharyngoesophageal phase 02/22/2016   Physical exam 12/15/2014   Left hip pain 09/29/2014   Radicular low back pain 09/11/2014   Bilateral hip bursitis 09/11/2014   BPV (benign positional vertigo) 02/22/2014   HTN (hypertension) 02/22/2014    PCP: Aida House, MD  REFERRING PROVIDER: Wilhelmenia Harada, MD  REFERRING DIAG: [phone] (ICD-10-CM) - Tear of left acetabular labrum, subsequent encounter  THERAPY DIAG:  Left hip pain  Difficulty walking  Other abnormalities of gait and mobility  Muscle weakness (generalized)  Rationale for Evaluation and Treatment: Rehabilitation  ONSET DATE: 11/09/23 L hip scope with labral repair  SUBJECTIVE:    Per eval - Pt states since surgery she has been doing pretty well, feels pain is a bit better than she expected. Has been using [place] quite a bit. Pt states she was told she could bear weight with walker. She does mention she has had pain with lower body dressing, particularly getting shoes on/off. States she hasn't performed any exercises yet for the most part but did try to do SLR, stopped this due to pain (advised her to defer this movement given post op restrictions).  Has historically been quite active.  No distal pain, N/T. No redness/warmth.   SUBJECTIVE STATEMENT: 12/31/2023 Pt states her hamstring has been doing better. States Dr. Hermina Loosen gave her a shot in her hip which helped a lot with her hip pain. Does get some soreness with prolonged community activities (I.e. church and walking).  PERTINENT HISTORY: asthma, GERD, headaches, HTN, IBS, BPPV  PAIN:  Are you having pain: 2-3/10 L side hip  Per eval -  Location/description:L hip anterior/lateral Best-worst over past week: 0-5/10  -  aggravating factors: movement, especially lower body dressing/hygiene - Easing factors: icing    PRECAUTIONS: hip labral repair protocol  RED FLAGS: None   WEIGHT BEARING RESTRICTIONS: Per op note WBAT LLE  FALLS:  Has patient fallen in last 6 months? No  LIVING ENVIRONMENT: 2 story home bed/bath downstairs, lives w/ spouse and kids (18 and 31). Laundry upstairs . 1STE Currently staying with parents, staying on lower level. 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  DIAGNOSTIC FINDINGS:  DOS 11/09/23 L hip arthroscopic labral repair  PATIENT SURVEYS:  LEFS 25/80  11/19/23  COGNITION: Overall cognitive status: Within functional limits for tasks assessed     SENSATION: Denies sensory complaints  EDEMA/INSPECTION:  Dressing appears clean and intact, no significant redness or swelling about surgical site. No apparent drainage/bleeding.    LOWER EXTREMITY ROM:  ROM Right eval Left eval  Hip flexion    Hip extension    Hip abduction    Hip adduction    Hip internal rotation    Hip external rotation    Knee flexion    Knee extension    Ankle dorsiflexion    Ankle plantarflexion    Ankle inversion    Ankle eversion     (Blank rows = not tested) (Key: WFL = within functional limits not formally assessed, * = concordant pain, s = stiffness/stretching sensation, NT = not tested) Comments: PROM only on eval; flex/ER/IR painless, maintaining ROM within surgical protocol  LOWER EXTREMITY MMT:  MMT Right eval Left eval  Hip flexion    Hip extension    Hip abduction    Hip adduction    Hip internal  rotation    Hip external rotation    Knee flexion    Knee extension    Ankle dorsiflexion    Ankle plantarflexion    Ankle inversion    Ankle eversion     (Blank rows = not tested) (Key: WFL = within functional limits not formally assessed, * = concordant pain, s = stiffness/stretching sensation, NT = not tested) Comment: deferred on eval given proximity to surgery  FUNCTIONAL TESTS:  Sit to stand: BIL UE support, reduced WB through surgical limb, inc time/effort   GAIT: Distance walked: within clinic Assistive device utilized:  standard walker Level of assistance: Modified independence Comments: step to pattern, increased distance from RW, reduced gait speed/cadence, reduced WB through surgical limb as expected  TREATMENT DATE:  5/1 Bike L3 x 6 min Seated figure 4 stretch x30" Supine ITB stretch with strap x30" Self care: Self massage with tennis ball for glute med, exercises equipment that pt can start to use (I.e. treadmill and elliptical), body weight strengthening Sidelying clamshell green TB 2x10 Bridge + march green TB 2x10 Single leg clock reach 2x10 Amb ind no cane, working on decreasing trendelenburg pattern/truncal lean   4/23 Manual: All PROM performed with distraction to reduce pain and improve movement Grade 1&2 inferior glide STM to anterior hip and reviewed self STM to anterior hip There-ex: Clamshell 3x10 red RTB Bridge 3x10 red RTB Prone ER/IR  There-Act Step ups 3x10 Mini squat 3x10  Gait 344ft with SPC    Treatment                            12/08/23: Blank lines following charge title = not provided on this treatment date.   Manual:  TPDN YES Trigger Point Dry Needling  Subsequent Treatment: Instructions reviewed, if requested by the patient, prior to subsequent dry needling treatment.    Patient Verbal Consent Given: Yes Education Handout Provided: Yes Muscles Treated: L Hamstrings 2x in medial hamstring using a .30x50 needle Electrical Stimulation Performed: No Treatment Response/Outcome: Good twitch response   STM to L HS, IASTM using roller  There-ex: Recumbent bike L3 x 6 min Seated HS stretch x30" with knees straight and then knees bent Hip hinge into L stretch for hamstring stretch in standing Clamshell yellow TB 2x10  Theract: Staggered stance deadlift x10 Gait with SPC in hall Qped donkey kick 2x10 yellow TB Qped alternating UE raise 2x10 Half kneel to tall kneel 2x10   Treatment                            12/03/23: Blank lines following charge title = not provided on this treatment date.   Manual:  TPDN YES Trigger Point Dry Needling  Subsequent Treatment: Instructions reviewed, if requested by the patient, prior to subsequent dry needling treatment.   Patient Verbal Consent Given: Yes Education Handout Provided: Yes Muscles Treated: L Hamstrings 4x in lateral hamstring using a .30x50 needle Electrical Stimulation Performed: No Treatment Response/Outcome: Good twitch response   Needling performed by Signa Drier PT DPT STM to L HS, IASTM using roller There-ex: PROM L hip HS sets x10 Long sit HSS Gait with SPC in hall     PATIENT EDUCATION:  Education details: HEP updates, safely weaning off SPC, appropriate amount of soreness with exercises, appropriate machines at the gym and body weight strengthening  Person educated: Patient Education method: Explanation, Demonstration, Tactile cues, Verbal cues Education comprehension: verbalized understanding, returned demonstration, verbal cues required, tactile cues required, and needs further education     HOME EXERCISE PROGRAM: Access Code: [email or web address] URL: [email or web address] Date: 12/31/2023 Prepared by: Joliet Mallozzi April Erman Hayward  Exercises - Supine Transversus Abdominis  Bracing - Hands on Stomach  - 3-4 x daily - 1 sets - 10-12 reps - Seated Table Hamstring Stretch  - 2 x daily - 7 x weekly - 3 sets - 30seconds hold - Clamshell with Resistance  - 1 x daily - 7 x weekly - 2 sets - 10 reps - Modified Single-Leg Deadlift  - 1 x daily - 7 x weekly - 2 sets - 10 reps - Supine ITB Stretch with Strap (  Mirrored)  - 1 x daily - 7 x weekly - 2 sets - 30 sec hold - Seated Piriformis Stretch with Trunk Bend  - 1 x daily - 7 x weekly - 2 sets - 30 se hold - Marching Bridge  - 1 x daily - 7 x weekly - 2 sets - 10 reps - Single Leg Balance with Clock Reach  - 1 x daily - 7 x weekly - 2 sets - 10 reps  ASSESSMENT:  CLINICAL IMPRESSION: 52 days post op. Pt doing well with pain/soreness mostly along glute med. Progressed pt's HEP -- able to tolerate green TB. Worked on hip stability in standing this session without compensatory patterns. Worked on gait without a/d. Discussed slowly weaning off SPC. Pt has met all of her STGs at this time.   Per eval - Patient is a pleasant 48 y.o. woman who was seen today for physical therapy evaluation and treatment for L hip arthroscopic labral repair DOS 11/09/23. Today she presents with mobility deficits as expected post op and difficulty performing usual activities. On exam she appears to be doing well post op, did require education on safety w/ mobility and post op restrictions. Dressing reinforced but overall surgical site is well appearing. No concerning symptoms today. Tolerates HEP and exam well without adverse event. Recommend skilled PT to address aforementioned deficits with aim of improving functional tolerance and reducing pain with typical activities. Pt departs today's session in no acute distress, all voiced concerns/questions addressed appropriately from PT perspective.    OBJECTIVE IMPAIRMENTS: Abnormal gait, decreased activity tolerance, decreased endurance, decreased mobility, difficulty walking, decreased ROM, decreased  strength, improper body mechanics, and pain.     GOALS:  SHORT TERM GOALS: Target date: 12/24/2023  Pt will demonstrate appropriate understanding and performance of initially prescribed HEP in order to facilitate improved independence with management of symptoms.  Baseline: HEP established  Goal status: MET 12/31/23  2. Pt will report at least 25% improvement in overall pain levels over past week in order to facilitate improved tolerance to typical daily activities.   Baseline: 0-5/10  Goal status: MET 95% improvement 12/31/23  LONG TERM GOALS: Target date: 02/04/2024   Pt will improve MCID or greater on LEFS in order to demonstrate improved perception of function due to symptoms (MCID 9 pts) Baseline: LEFS TBD Goal status: INITIAL  2.  Pt will demonstrate L hip AROM grossly WNL in order to facilitate improved tolerance to functional movements. Baseline: see ROM chart above Goal status: INITIAL  3.  Pt will demonstrate ability to navigate one flight of stairs safely w/o UE support and mechanics WNL in order to facilitate improved home navigation. Baseline: staying on main level Goal status: INITIAL  4.  Pt will be able to perform 5 consecutive sit to stands without pain and with mechanics grossly WNL in order to facilitate improved safety w/ transfers. Baseline: altered mechanics as expected Goal status: INITIAL    5. Pt will be able to ambulate community distances without limitation and with LRAD in order to facilitate improved community navigation and tolerance to occupational tasks.  Baseline: ambulating into clinic with mild pain, standard walker  Goal status: INITIAL    PLAN:  PT FREQUENCY: 1-2x/week  PT DURATION: 12 weeks  PLANNED INTERVENTIONS: 97164- PT Re-evaluation, 97110-Therapeutic exercises, 97530- Therapeutic activity, 97112- Neuromuscular re-education, 97535- Self Care, 91478- Manual therapy, [phone]- Gait training, [phone]- Aquatic Therapy, [phone]- Electrical  stimulation (unattended), Patient/Family education, Balance training, Stair training, Taping, Dry Needling, Joint  mobilization, Spinal mobilization, Scar mobilization, Cryotherapy, and Moist heat  PLAN FOR NEXT SESSION: Review/update HEP PRN. Progress through hip labral repair protocol as able/appropriate. Symptom modification strategies as indicated/appropriate.    Neill Jurewicz April Ma L Rovena Hearld, PT 12/31/2023 11:01 AM

## 2024-01-07 ENCOUNTER — Encounter (HOSPITAL_BASED_OUTPATIENT_CLINIC_OR_DEPARTMENT_OTHER): Admitting: Physical Therapy

## 2024-01-19 ENCOUNTER — Other Ambulatory Visit: Payer: Self-pay

## 2024-01-19 ENCOUNTER — Other Ambulatory Visit: Payer: Self-pay | Admitting: Family Medicine

## 2024-01-19 ENCOUNTER — Other Ambulatory Visit (HOSPITAL_COMMUNITY): Payer: Self-pay

## 2024-01-19 MED ORDER — TIZANIDINE HCL 2 MG PO TABS
2.0000 mg | ORAL_TABLET | Freq: Three times a day (TID) | ORAL | 0 refills | Status: DC | PRN
Start: 2024-01-19 — End: 2024-03-07
  Filled 2024-01-19: qty 60, 10d supply, fill #0

## 2024-01-19 NOTE — Telephone Encounter (Signed)
 Rx refill request approved per Dr. Zollie Pee orders.

## 2024-02-01 ENCOUNTER — Encounter (HOSPITAL_BASED_OUTPATIENT_CLINIC_OR_DEPARTMENT_OTHER)

## 2024-02-03 ENCOUNTER — Ambulatory Visit (HOSPITAL_BASED_OUTPATIENT_CLINIC_OR_DEPARTMENT_OTHER): Admitting: Orthopaedic Surgery

## 2024-02-03 ENCOUNTER — Encounter (HOSPITAL_BASED_OUTPATIENT_CLINIC_OR_DEPARTMENT_OTHER)

## 2024-02-03 ENCOUNTER — Other Ambulatory Visit: Payer: Self-pay

## 2024-02-03 ENCOUNTER — Other Ambulatory Visit (HOSPITAL_COMMUNITY): Payer: Self-pay

## 2024-02-03 DIAGNOSIS — S73192D Other sprain of left hip, subsequent encounter: Secondary | ICD-10-CM

## 2024-02-03 NOTE — Progress Notes (Signed)
 Post Operative Evaluation    Procedure/Date of Surgery: Left hip labral repair 3/10  Interval History:   Presents today 12 weeks status post left hip labral repair.  Overall she is doing very well.  She is having some mild gluteal type soreness and pain but overall continuing to improve nicely.   PMH/PSH/Family History/Social History/Meds/Allergies:    Past Medical History:  Diagnosis Date   Arthritis    right foot   Asthma    Congenital flat foot    has had bilateral foot surgery to correct   Frequent headaches    GERD (gastroesophageal reflux disease)    Hypertension    Irritable bowel syndrome (IBS)    no current med.   Painful orthopaedic hardware [facility]) 03/2013   right foot   Past Surgical History:  Procedure Laterality Date   CALCANEAL OSTEOTOMY Right 12/23/2012   Procedure: RIGHT CALCANEAL OSTEOTOMY, RIGHT LATERAL COLUMN LENGTHENING, RIGHT COTTON OSTEOTOMY, RIGHT FLEX DIGITORIUM LONGUS TRANSFER, RIGHT KIDNER PROCEDURE ;  Surgeon: Amada Backer, MD;  Location: [facility];  Service: Orthopedics;  Laterality: Right;   CESAREAN SECTION  07/13/2005; 06/03/2006   DILATION AND EVACUATION  08/16/2008   FOOT SURGERY Left 2002   FOOT SURGERY Right 10/2018   GASTROCNEMIUS RECESSION Right 12/23/2012   Procedure: RIGHT GASTRO RECESSION ;  Surgeon: Amada Backer, MD;  Location: [facility];  Service: Orthopedics;  Laterality: Right;   HARDWARE REMOVAL Right 03/24/2013   Procedure: HARDWARE REMOVAL RIGHT HEEL;  Surgeon: Amada Backer, MD;  Location: [facility];  Service: Orthopedics;  Laterality: Right;   INTRAUTERINE DEVICE INSERTION     WISDOM TOOTH EXTRACTION     Social History   Socioeconomic History   Marital status: Married    Spouse name: Not on file   Number of children: 3   Years of education: Not on file   Highest education level: Bachelor's degree (e.g., BA, AB, BS)  Occupational History    Occupation: Teacher, adult education: [employer]  Tobacco Use   Smoking status: Never   Smokeless tobacco: Never  Vaping Use   Vaping status: Never Used  Substance and Sexual Activity   Alcohol use: No   Drug use: No   Sexual activity: Yes    Birth control/protection: I.U.D.  Other Topics Concern   Not on file  Social History Narrative   Not on file   Social Drivers of Health   Financial Resource Strain: Low Risk  (12/21/2023)   Overall Financial Resource Strain (CARDIA)    Difficulty of Paying Living Expenses: Not very hard  Food Insecurity: No Food Insecurity (12/21/2023)   Hunger Vital Sign    Worried About Running Out of Food in the Last Year: Never true    Ran Out of Food in the Last Year: Never true  Transportation Needs: No Transportation Needs (12/21/2023)   PRAPARE - Administrator, Civil Service (Medical): No    Lack of Transportation (Non-Medical): No  Physical Activity: Unknown (12/21/2023)   Exercise Vital Sign    Days of Exercise per Week: 0 days    Minutes of Exercise per Session: Not on file  Stress: Stress Concern Present (12/21/2023)   [employer] of Occupational Health - Occupational Stress Questionnaire    Feeling of Stress :  Very much  Social Connections: Socially Integrated (12/21/2023)   Social Connection and Isolation Panel [NHANES]    Frequency of Communication with Friends and Family: More than three times a week    Frequency of Social Gatherings with Friends and Family: More than three times a week    Attends Religious Services: More than 4 times per year    Active Member of [employer] or Organizations: Yes    Attends Engineer, structural: More than 4 times per year    Marital Status: Married   Family History  Problem Relation Age of Onset   Hypertension Mother    Hypertension Father    Diabetes Father    Irritable bowel syndrome Father    Arrhythmia Father    Breast cancer Paternal Grandmother    Colon cancer Cousin         maternal   Rectal cancer Neg Hx    Esophageal cancer Neg Hx    Stomach cancer Neg Hx    Allergies  Allergen Reactions   Augmentin  [Amoxicillin -Pot Clavulanate] Nausea And Vomiting   Biotin Other (See Comments)    Severe yeast infection   Sulfa Antibiotics Other (See Comments)    Reaction:  Muscle pain    Current Outpatient Medications  Medication Sig Dispense Refill   acetaminophen  (TYLENOL ) 500 MG tablet Take 1,000 mg by mouth every 6 (six) hours as needed.     amLODipine  (NORVASC ) 10 MG tablet Take 1 tablet (10 mg total) by mouth daily. 90 tablet 1   aspirin  EC 325 MG tablet Take 1 tablet (325 mg total) by mouth daily. 14 tablet 0   aspirin -acetaminophen -caffeine (EXCEDRIN MIGRAINE) 250-250-65 MG tablet Take 1 tablet by mouth every 6 (six) hours as needed for headache.     celecoxib  (CELEBREX ) 200 MG capsule Take 1 capsule (200 mg total) by mouth 2 (two) times daily as needed. 60 capsule 5   chlorthalidone  (HYGROTON ) 25 MG tablet Take 1 tablet (25 mg total) by mouth daily. 90 tablet 1   gabapentin  (NEURONTIN ) 300 MG capsule Take 1 capsule (300 mg total) by mouth 3 (three) times daily as needed. 90 capsule 1   HYDROcodone -acetaminophen  (NORCO/VICODIN) 5-325 MG tablet Take 1 tablet by mouth every 6 (six) hours as needed. 15 tablet 0   levocetirizine (XYZAL ) 5 MG tablet Take 1 tablet (5 mg total) by mouth every evening. 90 tablet 1   levonorgestrel (MIRENA) 20 MCG/DAY IUD 1 each by Intrauterine route once.     montelukast  (SINGULAIR ) 10 MG tablet Take 1 tablet (10 mg total) by mouth at bedtime. 90 tablet 1   olmesartan  (BENICAR ) 20 MG tablet Take 1 tablet (20 mg total) by mouth daily. 90 tablet 1   OVER THE COUNTER MEDICATION Glucosamine chondroitin with vitamin D -once a day     oxyCODONE  (ROXICODONE ) 5 MG immediate release tablet Take 1 tablet (5 mg total) by mouth every 4 (four) hours as needed for severe pain (pain score 7-10) or breakthrough pain. 10 tablet 0   tiZANidine  (ZANAFLEX ) 2  MG tablet Take 1-2 tablets (2-4 mg total) by mouth every 8 (eight) hours as needed. 60 tablet 0   No current facility-administered medications for this visit.   No results found.  Review of Systems:   A ROS was performed including pertinent positives and negatives as documented in the HPI.   Musculoskeletal Exam:    There were no vitals taken for this visit.  Left hip incisions are well-appearing without erythema or drainage.  Range  of motion 30 degrees internal/external rotation of the left hip.  Tenderness about the greater trochanter at the gluteus insertion  Imaging:      I personally reviewed and interpreted the radiographs.   Assessment:   12-week status post left hip labral repair.  She is doing much better at today's visit with improvement in her hamstring symptoms.  She is having some gluteal symptoms today which did improve nicely with her injection.  At this time she is overall continuing to make significant improvement.  I will plan to see her back in 12 weeks for final last check  Plan :    - Return to clinic 12 weeks     I personally saw and evaluated the patient, and participated in the management and treatment plan.  Tammy Harada, MD Attending Physician, Orthopedic Surgery  This document was dictated using Dragon voice recognition software. A reasonable attempt at proof reading has been made to minimize errors.

## 2024-02-09 ENCOUNTER — Encounter (HOSPITAL_BASED_OUTPATIENT_CLINIC_OR_DEPARTMENT_OTHER): Admitting: Physical Therapy

## 2024-02-10 ENCOUNTER — Other Ambulatory Visit (HOSPITAL_COMMUNITY): Payer: Self-pay

## 2024-02-18 ENCOUNTER — Other Ambulatory Visit (HOSPITAL_BASED_OUTPATIENT_CLINIC_OR_DEPARTMENT_OTHER): Payer: Self-pay | Admitting: Orthopaedic Surgery

## 2024-02-18 ENCOUNTER — Other Ambulatory Visit (HOSPITAL_COMMUNITY): Payer: Self-pay

## 2024-02-18 ENCOUNTER — Encounter (HOSPITAL_COMMUNITY): Payer: Self-pay | Admitting: Pharmacist

## 2024-02-18 ENCOUNTER — Encounter (HOSPITAL_BASED_OUTPATIENT_CLINIC_OR_DEPARTMENT_OTHER): Payer: Self-pay | Admitting: Orthopaedic Surgery

## 2024-02-18 ENCOUNTER — Other Ambulatory Visit: Payer: Self-pay

## 2024-02-18 MED ORDER — METHYLPREDNISOLONE 4 MG PO TBPK
ORAL_TABLET | ORAL | 0 refills | Status: DC
  Filled 2024-02-18: qty 21, 6d supply, fill #0

## 2024-02-20 ENCOUNTER — Other Ambulatory Visit (HOSPITAL_COMMUNITY): Payer: Self-pay

## 2024-03-07 ENCOUNTER — Other Ambulatory Visit: Payer: Self-pay | Admitting: Family Medicine

## 2024-03-08 ENCOUNTER — Other Ambulatory Visit (HOSPITAL_COMMUNITY): Payer: Self-pay

## 2024-03-08 ENCOUNTER — Other Ambulatory Visit: Payer: Self-pay

## 2024-03-08 MED ORDER — TIZANIDINE HCL 2 MG PO TABS
2.0000 mg | ORAL_TABLET | Freq: Three times a day (TID) | ORAL | 0 refills | Status: DC | PRN
  Filled 2024-03-08: qty 60, 10d supply, fill #0

## 2024-03-08 NOTE — Telephone Encounter (Signed)
 Last OV 08/31/23 Next OV not scheduled  Last refill 01/19/24 Qty #60/0

## 2024-03-11 ENCOUNTER — Encounter (HOSPITAL_BASED_OUTPATIENT_CLINIC_OR_DEPARTMENT_OTHER): Payer: Self-pay | Admitting: Orthopaedic Surgery

## 2024-03-16 ENCOUNTER — Other Ambulatory Visit (HOSPITAL_COMMUNITY): Payer: Self-pay

## 2024-03-21 ENCOUNTER — Encounter (HOSPITAL_BASED_OUTPATIENT_CLINIC_OR_DEPARTMENT_OTHER): Payer: Self-pay

## 2024-03-21 ENCOUNTER — Ambulatory Visit (HOSPITAL_BASED_OUTPATIENT_CLINIC_OR_DEPARTMENT_OTHER): Admitting: Student

## 2024-04-07 ENCOUNTER — Other Ambulatory Visit (HOSPITAL_BASED_OUTPATIENT_CLINIC_OR_DEPARTMENT_OTHER): Payer: Self-pay

## 2024-04-07 DIAGNOSIS — M25552 Pain in left hip: Secondary | ICD-10-CM | POA: Diagnosis not present

## 2024-04-07 MED ORDER — PREDNISONE 5 MG (21) PO TBPK
ORAL_TABLET | ORAL | 0 refills | Status: DC
  Filled 2024-04-07 – 2024-04-08 (×2): qty 21, 6d supply, fill #0

## 2024-04-08 ENCOUNTER — Encounter: Payer: Self-pay | Admitting: Pharmacist

## 2024-04-08 ENCOUNTER — Other Ambulatory Visit: Payer: Self-pay

## 2024-04-08 ENCOUNTER — Other Ambulatory Visit (HOSPITAL_COMMUNITY): Payer: Self-pay

## 2024-04-09 ENCOUNTER — Other Ambulatory Visit: Payer: Self-pay | Admitting: Family Medicine

## 2024-04-11 ENCOUNTER — Encounter: Payer: Self-pay | Admitting: Family Medicine

## 2024-04-11 ENCOUNTER — Other Ambulatory Visit (HOSPITAL_COMMUNITY): Payer: Self-pay

## 2024-04-11 DIAGNOSIS — F40243 Fear of flying: Secondary | ICD-10-CM

## 2024-04-11 MED ORDER — HYDROXYZINE PAMOATE 25 MG PO CAPS
25.0000 mg | ORAL_CAPSULE | Freq: Three times a day (TID) | ORAL | 0 refills | Status: DC | PRN
  Filled 2024-04-11: qty 30, 10d supply, fill #0

## 2024-04-14 ENCOUNTER — Other Ambulatory Visit (HOSPITAL_COMMUNITY): Payer: Self-pay

## 2024-04-15 ENCOUNTER — Other Ambulatory Visit: Payer: Self-pay | Admitting: Family Medicine

## 2024-04-15 ENCOUNTER — Other Ambulatory Visit (HOSPITAL_COMMUNITY): Payer: Self-pay

## 2024-04-15 ENCOUNTER — Encounter (HOSPITAL_COMMUNITY): Payer: Self-pay

## 2024-04-15 NOTE — Telephone Encounter (Signed)
 Last OV 08/31/23 Next OV not scheduled  Last refill 03/08/24 Qty #60/0

## 2024-04-19 ENCOUNTER — Other Ambulatory Visit (HOSPITAL_COMMUNITY): Payer: Self-pay

## 2024-04-21 ENCOUNTER — Telehealth (HOSPITAL_BASED_OUTPATIENT_CLINIC_OR_DEPARTMENT_OTHER): Payer: Self-pay | Admitting: Orthopaedic Surgery

## 2024-04-21 NOTE — Telephone Encounter (Signed)
 Patient states that she had a fall and would like to get in to see Dr B but she is ok seeing maryann if that fine. Please advise

## 2024-04-22 ENCOUNTER — Ambulatory Visit (INDEPENDENT_AMBULATORY_CARE_PROVIDER_SITE_OTHER): Admitting: Physician Assistant

## 2024-04-22 ENCOUNTER — Encounter (HOSPITAL_BASED_OUTPATIENT_CLINIC_OR_DEPARTMENT_OTHER): Payer: Self-pay | Admitting: Physician Assistant

## 2024-04-22 ENCOUNTER — Ambulatory Visit (INDEPENDENT_AMBULATORY_CARE_PROVIDER_SITE_OTHER)

## 2024-04-22 DIAGNOSIS — M25552 Pain in left hip: Secondary | ICD-10-CM | POA: Diagnosis not present

## 2024-04-22 MED ORDER — LIDOCAINE HCL 1 % IJ SOLN
5.0000 mL | INTRAMUSCULAR | Status: AC | PRN
  Administered 2024-04-22: 5 mL

## 2024-04-22 MED ORDER — TRIAMCINOLONE ACETONIDE 40 MG/ML IJ SUSP
40.0000 mg | INTRAMUSCULAR | Status: AC | PRN
  Administered 2024-04-22: 40 mg via INTRA_ARTICULAR

## 2024-04-22 MED ORDER — METHYLPREDNISOLONE ACETATE 40 MG/ML IJ SUSP
40.0000 mg | INTRAMUSCULAR | Status: AC | PRN
  Administered 2024-04-22: 40 mg via INTRA_ARTICULAR

## 2024-04-22 NOTE — Progress Notes (Signed)
 Office Visit Note   Patient: [name]           Date of Birth: 01-02-1976           MRN: [id number] Visit Date: 04/22/2024              Requested by: Ozell Heron HERO, MD [address] [place],  [place] [address] PCP: Ozell Heron HERO, MD  Chief Complaint  Patient presents with  . Left Hip - Pain      HPI: Send is a pleasant 48 year old woman who is status post left hip labral repair in March with Dr. Genelle.  She was doing extremely well.  She finished PT.  She pain got significantly better no tingling no numbness however she did sustain a fall onto her left hip in July.  She has been taking Celebrex  pot Tylenol  ice and Voltaren  gel also using lidocaine  patches  Assessment & Plan: Visit Diagnoses:  1. Pain in left hip     Plan: After exam today she seems to have a lot of tenderness over the trochanteric bursa a little bit on the proximal hamstrings and SI joint.  I think this may all be connected from her fall.  I do not think she has anything going on with her hip intra-articularly.  I suggested a trial at a little physical therapy of also given her some information about stretching the IT band.  I talked her about trying a truck bursa injection today she like to do that may follow-up if no improvement  Follow-Up Instructions: Return if symptoms worsen or fail to improve.   Ortho Exam  Patient is alert, oriented, no adenopathy, well-dressed, normal affect, normal respiratory effort. Examination of her hip she has no pain in the groin with internal/external rotation.  She does have pain over the greater trochanter reproduced with external rotation of her hip and flexion.  A little pain in the proximal hamstring she is neurologically intact no real lower back pain.  No radicular findings.    Imaging: No results found. No images are attached to the encounter.  Labs: Lab Results  Component Value Date   HGBA1C 5.6 12/21/2023   HGBA1C 5.9 06/17/2023    HGBA1C 5.6 12/08/2022   LABORGA Multiple bacterial morphotypes present, none 09/11/2014   LABORGA predominant. Suggest appropriate recollection if 09/11/2014   LABORGA clinically indicated. 09/11/2014     Lab Results  Component Value Date   ALBUMIN 4.5 06/17/2023   ALBUMIN 4.2 06/09/2022   ALBUMIN 4.5 04/29/2021    No results found for: MG Lab Results  Component Value Date   VD25OH 22.89 (L) 06/17/2023   VD25OH 53.27 06/09/2022   VD25OH 7.32 (L) 03/31/2022    No results found for: PREALBUMIN    Latest Ref Rng & Units 04/29/2021    3:36 PM 12/14/2019   12:36 PM 04/05/2019    1:49 PM  CBC EXTENDED  WBC 3.4 - 10.8 x10E3/uL 3.4  3.2  3.2   RBC 3.77 - 5.28 x10E6/uL 4.63  4.50  4.59   Hemoglobin 11.1 - 15.9 g/dL 86.3  86.6  86.2   HCT 34.0 - 46.6 % 40.4  39.0  40.5   Platelets 150 - 450 x10E3/uL 344  371  356.0   NEUT# 1.4 - 7.7 K/uL   1.7   Lymph# 0.7 - 4.0 K/uL   1.1      There is no height or weight on file to calculate BMI.  Orders:  Orders  Placed This Encounter  Procedures  . DG Hip Unilat W OR W/O Pelvis Min 4 Views Left  . Ambulatory referral to Physical Therapy   No orders of the defined types were placed in this encounter.    Procedures: Large Joint Inj: L greater trochanter on 04/22/2024 3:36 PM Indications: pain and diagnostic evaluation Details: 22 G 1.5 in and 3.5 in needle, lateral approach  Arthrogram: No  Medications: 5 mL lidocaine  1 %; 40 mg methylPREDNISolone  acetate 40 MG/ML; 40 mg triamcinolone  acetonide 40 MG/ML Outcome: tolerated well, no immediate complications Procedure, treatment alternatives, risks and benefits explained, specific risks discussed. Consent was given by the patient.     Clinical Data: No additional findings.  ROS:  All other systems negative, except as noted in the HPI. Review of Systems  Objective: Vital Signs: There were no vitals taken for this visit.  Specialty Comments:  Narrative & Impression CLINICAL  DATA:  Neck pain, radiating to both shoulders.   EXAM: MRI CERVICAL SPINE WITHOUT CONTRAST   TECHNIQUE: Multiplanar, multisequence MR imaging of the cervical spine was performed. No intravenous contrast was administered.   COMPARISON:  None.   FINDINGS: The patient was unable to remain motionless for the exam. Small or subtle lesions could be overlooked.   Alignment: Anatomic   Vertebrae: No fracture, evidence of discitis, or bone lesion.   Cord: Normal signal and morphology. No significant stenosis or compression.   Posterior Fossa, vertebral arteries, paraspinal tissues: Negative.   Disc levels:   C2-3:  Normal.   C3-4: Central protrusion with osseous spurring. Effacement anterior subarachnoid space. RIGHT greater than LEFT C4 foraminal narrowing is compounded by facet arthropathy.   C4-5:  Tiny central protrusion.  No impingement.   C5-6:  Slight disc desiccation.  Facet arthropathy.  No impingement.   C6-7:  Normal interspace.   C7-T1:  Mild facet arthropathy.  Normal disc space.  No impingement.   IMPRESSION: Mild multilevel spondylosis. Central protrusion with osseous spurring at C3-4 in conjunction with facet arthropathy may be the symptomatic level. BILATERAL C4 foraminal narrowing is observed.     Electronically Signed   By: Norleen ONEIDA Catching M.D.   On: 05/26/2018 14:58  PMFS History: Patient Active Problem List   Diagnosis Date Noted  . Prediabetes 12/21/2023  . Tear of left acetabular labrum 11/09/2023  . Vitamin D  deficiency 03/31/2022  . Chronic cough 09/27/2018  . Cervical spondylosis 07/11/2018  . Lymphocytic colitis 05/21/2018  . Anxiety state 04/29/2016  . Atypical chest pain 02/22/2016  . Dysphagia, pharyngoesophageal phase 02/22/2016  . Physical exam 12/15/2014  . Left hip pain 09/29/2014  . Radicular low back pain 09/11/2014  . Bilateral hip bursitis 09/11/2014  . BPV (benign positional vertigo) 02/22/2014  . HTN (hypertension)  02/22/2014   Past Medical History:  Diagnosis Date  . Arthritis    right foot  . Asthma   . Congenital flat foot    has had bilateral foot surgery to correct  . Frequent headaches   . GERD (gastroesophageal reflux disease)   . Hypertension   . Irritable bowel syndrome (IBS)    no current med.  . Painful orthopaedic hardware [facility]) 03/2013   right foot    Family History  Problem Relation Age of Onset  . Hypertension Mother   . Hypertension Father   . Diabetes Father   . Irritable bowel syndrome Father   . Arrhythmia Father   . Breast cancer Paternal Grandmother   . Colon cancer Cousin  maternal  . Rectal cancer Neg Hx   . Esophageal cancer Neg Hx   . Stomach cancer Neg Hx     Past Surgical History:  Procedure Laterality Date  . CALCANEAL OSTEOTOMY Right 12/23/2012   Procedure: RIGHT CALCANEAL OSTEOTOMY, RIGHT LATERAL COLUMN LENGTHENING, RIGHT COTTON OSTEOTOMY, RIGHT FLEX DIGITORIUM LONGUS TRANSFER, RIGHT KIDNER PROCEDURE ;  Surgeon: Norleen Armor, MD;  Location: [facility];  Service: Orthopedics;  Laterality: Right;  . CESAREAN SECTION  07/13/2005; 06/03/2006  . DILATION AND EVACUATION  08/16/2008  . FOOT SURGERY Left 2002  . FOOT SURGERY Right 10/2018  . GASTROCNEMIUS RECESSION Right 12/23/2012   Procedure: RIGHT GASTRO RECESSION ;  Surgeon: Norleen Armor, MD;  Location: [facility];  Service: Orthopedics;  Laterality: Right;  . HARDWARE REMOVAL Right 03/24/2013   Procedure: HARDWARE REMOVAL RIGHT HEEL;  Surgeon: Norleen Armor, MD;  Location:  SURGERY CENTER;  Service: Orthopedics;  Laterality: Right;  . INTRAUTERINE DEVICE INSERTION    . WISDOM TOOTH EXTRACTION     Social History   Occupational History  . Occupation: Teacher, adult education: [employer]  Tobacco Use  . Smoking status: Never  . Smokeless tobacco: Never  Vaping Use  . Vaping status: Never Used  Substance and Sexual Activity  . Alcohol use: No  . Drug use: No  . Sexual  activity: Yes    Birth control/protection: I.U.D.

## 2024-05-03 ENCOUNTER — Encounter: Payer: Self-pay | Admitting: Sports Medicine

## 2024-05-09 ENCOUNTER — Encounter (HOSPITAL_BASED_OUTPATIENT_CLINIC_OR_DEPARTMENT_OTHER): Admitting: Orthopaedic Surgery

## 2024-05-24 ENCOUNTER — Encounter (HOSPITAL_BASED_OUTPATIENT_CLINIC_OR_DEPARTMENT_OTHER): Payer: Self-pay | Admitting: Orthopaedic Surgery

## 2024-06-03 ENCOUNTER — Ambulatory Visit (INDEPENDENT_AMBULATORY_CARE_PROVIDER_SITE_OTHER): Admitting: Orthopaedic Surgery

## 2024-06-03 ENCOUNTER — Other Ambulatory Visit (HOSPITAL_BASED_OUTPATIENT_CLINIC_OR_DEPARTMENT_OTHER): Payer: Self-pay

## 2024-06-03 ENCOUNTER — Other Ambulatory Visit (HOSPITAL_COMMUNITY): Payer: Self-pay

## 2024-06-03 DIAGNOSIS — M25552 Pain in left hip: Secondary | ICD-10-CM

## 2024-06-03 MED ORDER — METHYLPREDNISOLONE 4 MG PO TBPK
ORAL_TABLET | ORAL | 0 refills | Status: DC
  Filled 2024-06-03 (×2): qty 21, 6d supply, fill #0

## 2024-06-03 MED ORDER — TRIAMCINOLONE ACETONIDE 40 MG/ML IJ SUSP
80.0000 mg | INTRAMUSCULAR | Status: AC | PRN
  Administered 2024-06-03: 80 mg via INTRA_ARTICULAR

## 2024-06-03 MED ORDER — LIDOCAINE HCL 1 % IJ SOLN
4.0000 mL | INTRAMUSCULAR | Status: AC | PRN
  Administered 2024-06-03: 4 mL

## 2024-06-03 NOTE — Progress Notes (Signed)
 Post Operative Evaluation    Procedure/Date of Surgery: Left hip labral repair 3/10  Interval History:   Presents today 7 months status post above procedure.  She did unfortunately have a fall at which time she has been having significant pain about the entire hip girdle with radiation into the lower back and iliopsoas anteriorly.   PMH/PSH/Family History/Social History/Meds/Allergies:    Past Medical History:  Diagnosis Date  . Arthritis    right foot  . Asthma   . Congenital flat foot    has had bilateral foot surgery to correct  . Frequent headaches   . GERD (gastroesophageal reflux disease)   . Hypertension   . Irritable bowel syndrome (IBS)    no current med.  . Painful orthopaedic hardware 03/2013   right foot   Past Surgical History:  Procedure Laterality Date  . CALCANEAL OSTEOTOMY Right 12/23/2012   Procedure: RIGHT CALCANEAL OSTEOTOMY, RIGHT LATERAL COLUMN LENGTHENING, RIGHT COTTON OSTEOTOMY, RIGHT FLEX DIGITORIUM LONGUS TRANSFER, RIGHT KIDNER PROCEDURE ;  Surgeon: Norleen Armor, MD;  Location: [facility];  Service: Orthopedics;  Laterality: Right;  . CESAREAN SECTION  07/13/2005; 06/03/2006  . DILATION AND EVACUATION  08/16/2008  . FOOT SURGERY Left 2002  . FOOT SURGERY Right 10/2018  . GASTROCNEMIUS RECESSION Right 12/23/2012   Procedure: RIGHT GASTRO RECESSION ;  Surgeon: Norleen Armor, MD;  Location: [facility];  Service: Orthopedics;  Laterality: Right;  . HARDWARE REMOVAL Right 03/24/2013   Procedure: HARDWARE REMOVAL RIGHT HEEL;  Surgeon: Norleen Armor, MD;  Location: [facility];  Service: Orthopedics;  Laterality: Right;  . INTRAUTERINE DEVICE INSERTION    . WISDOM TOOTH EXTRACTION     Social History   Socioeconomic History  . Marital status: Married    Spouse name: Not on file  . Number of children: 3  . Years of education: Not on file  . Highest education level: Bachelor's  degree (e.g., BA, AB, BS)  Occupational History  . Occupation: Teacher, adult education: [employer]  Tobacco Use  . Smoking status: Never  . Smokeless tobacco: Never  Vaping Use  . Vaping status: Never Used  Substance and Sexual Activity  . Alcohol use: No  . Drug use: No  . Sexual activity: Yes    Birth control/protection: I.U.D.  Other Topics Concern  . Not on file  Social History Narrative  . Not on file   Social Drivers of Health   Financial Resource Strain: Low Risk  (12/21/2023)   Overall Financial Resource Strain (CARDIA)   . Difficulty of Paying Living Expenses: Not very hard  Food Insecurity: No Food Insecurity (12/21/2023)   Hunger Vital Sign   . Worried About Programme researcher, broadcasting/film/video in the Last Year: Never true   . Ran Out of Food in the Last Year: Never true  Transportation Needs: No Transportation Needs (12/21/2023)   PRAPARE - Transportation   . Lack of Transportation (Medical): No   . Lack of Transportation (Non-Medical): No  Physical Activity: Unknown (12/21/2023)   Exercise Vital Sign   . Days of Exercise per Week: 0 days   . Minutes of Exercise per Session: Not on file  Stress: Stress Concern Present (12/21/2023)   [employer] of Occupational Health - Occupational Stress Questionnaire   . Feeling  of Stress : Very much  Social Connections: Socially Integrated (12/21/2023)   Social Connection and Isolation Panel   . Frequency of Communication with Friends and Family: More than three times a week   . Frequency of Social Gatherings with Friends and Family: More than three times a week   . Attends Religious Services: More than 4 times per year   . Active Member of Clubs or Organizations: Yes   . Attends Banker Meetings: More than 4 times per year   . Marital Status: Married   Family History  Problem Relation Age of Onset  . Hypertension Mother   . Hypertension Father   . Diabetes Father   . Irritable bowel syndrome Father   . Arrhythmia Father   .  Breast cancer Paternal Grandmother   . Colon cancer Cousin        maternal  . Rectal cancer Neg Hx   . Esophageal cancer Neg Hx   . Stomach cancer Neg Hx    Allergies  Allergen Reactions  . Augmentin  [Amoxicillin -Pot Clavulanate] Nausea And Vomiting  . Biotin Other (See Comments)    Severe yeast infection  . Sulfa Antibiotics Other (See Comments)    Reaction:  Muscle pain    Current Outpatient Medications  Medication Sig Dispense Refill  . methylPREDNISolone  (MEDROL  DOSEPAK) 4 MG TBPK tablet Take per packet instructions 21 tablet 0  . acetaminophen  (TYLENOL ) 500 MG tablet Take 1,000 mg by mouth every 6 (six) hours as needed.    . amLODipine  (NORVASC ) 10 MG tablet Take 1 tablet (10 mg total) by mouth daily. 90 tablet 1  . aspirin  EC 325 MG tablet Take 1 tablet (325 mg total) by mouth daily. 14 tablet 0  . aspirin -acetaminophen -caffeine (EXCEDRIN MIGRAINE) 250-250-65 MG tablet Take 1 tablet by mouth every 6 (six) hours as needed for headache.    . celecoxib  (CELEBREX ) 200 MG capsule Take 1 capsule (200 mg total) by mouth 2 (two) times daily as needed. 60 capsule 5  . chlorthalidone  (HYGROTON ) 25 MG tablet Take 1 tablet (25 mg total) by mouth daily. 90 tablet 1  . gabapentin  (NEURONTIN ) 300 MG capsule Take 1 capsule (300 mg total) by mouth 3 (three) times daily as needed. 90 capsule 1  . HYDROcodone -acetaminophen  (NORCO/VICODIN) 5-325 MG tablet Take 1 tablet by mouth every 6 (six) hours as needed. 15 tablet 0  . hydrOXYzine  (VISTARIL ) 25 MG capsule Take 1 capsule (25 mg total) by mouth every 8 (eight) hours as needed. 30 capsule 0  . levocetirizine (XYZAL ) 5 MG tablet Take 1 tablet (5 mg total) by mouth every evening. 90 tablet 1  . levonorgestrel (MIRENA) 20 MCG/DAY IUD 1 each by Intrauterine route once.    . methylPREDNISolone  (MEDROL  DOSEPAK) 4 MG TBPK tablet Take by mouth per packet instructions for 6 days 21 tablet 0  . montelukast  (SINGULAIR ) 10 MG tablet Take 1 tablet (10 mg total)  by mouth at bedtime. 90 tablet 1  . olmesartan  (BENICAR ) 20 MG tablet Take 1 tablet (20 mg total) by mouth daily. 90 tablet 1  . OVER THE COUNTER MEDICATION Glucosamine chondroitin with vitamin D -once a day    . oxyCODONE  (ROXICODONE ) 5 MG immediate release tablet Take 1 tablet (5 mg total) by mouth every 4 (four) hours as needed for severe pain (pain score 7-10) or breakthrough pain. 10 tablet 0  . predniSONE  (STERAPRED UNI-PAK 21 TAB) 5 MG (21) TBPK tablet Take as directed per package instructions. 21 tablet 0  .  tiZANidine  (ZANAFLEX ) 2 MG tablet Take 1-2 tablets (2-4 mg total) by mouth every 8 (eight) hours as needed. 60 tablet 0   No current facility-administered medications for this visit.   No results found.  Review of Systems:   A ROS was performed including pertinent positives and negatives as documented in the HPI.   Musculoskeletal Exam:    There were no vitals taken for this visit.  Left hip incisions are well-healed.  Range of motion is 30 degrees internal rotation with negative FADIR.  She has tenderness with resisted flexion of the left hip.  Imaging:      I personally reviewed and interpreted the radiographs.   Assessment:   7 months status post left hip labral repair.  She has been having a flareup of pain after recent fall.  At this time I am concerned for some iliopsoas irritation.  For this I have recommended an ultrasound-guided iliopsoas injection which she would like to proceed with after verbal consent was obtained.  I will plan to see her back in 2 months for reassessment Plan :    - Left hip iliopsoas injection provided verbal consent obtained    Procedure Note  Patient: ELYSHA DAW             Date of Birth: 01-22-1976           MRN: [id number]             Visit Date: 06/03/2024  Procedures: Visit Diagnoses:  1. Pain in left hip     Large Joint Inj: L hip joint on 06/03/2024 11:03 AM Indications: pain Details: 22 G 3.5 in needle,  ultrasound-guided anterolateral approach  Arthrogram: No  Medications: 4 mL lidocaine  1 %; 80 mg triamcinolone  acetonide 40 MG/ML Outcome: tolerated well, no immediate complications Procedure, treatment alternatives, risks and benefits explained, specific risks discussed. Consent was given by the patient. Immediately prior to procedure a time out was called to verify the correct patient, procedure, equipment, support staff and site/side marked as required. Patient was prepped and draped in the usual sterile fashion.           I personally saw and evaluated the patient, and participated in the management and treatment plan.  Elspeth Parker, MD Attending Physician, Orthopedic Surgery  This document was dictated using Dragon voice recognition software. A reasonable attempt at proof reading has been made to minimize errors.

## 2024-06-07 ENCOUNTER — Ambulatory Visit (HOSPITAL_BASED_OUTPATIENT_CLINIC_OR_DEPARTMENT_OTHER): Admitting: Physical Therapy

## 2024-06-09 ENCOUNTER — Other Ambulatory Visit (HOSPITAL_COMMUNITY): Payer: Self-pay

## 2024-06-09 ENCOUNTER — Other Ambulatory Visit (HOSPITAL_BASED_OUTPATIENT_CLINIC_OR_DEPARTMENT_OTHER): Payer: Self-pay | Admitting: Orthopaedic Surgery

## 2024-06-09 MED ORDER — METHYLPREDNISOLONE 4 MG PO TBPK
ORAL_TABLET | ORAL | 0 refills | Status: DC
  Filled 2024-06-09: qty 21, 6d supply, fill #0

## 2024-06-15 ENCOUNTER — Other Ambulatory Visit: Payer: Self-pay

## 2024-06-15 ENCOUNTER — Other Ambulatory Visit: Payer: Self-pay | Admitting: Family Medicine

## 2024-06-15 DIAGNOSIS — F40243 Fear of flying: Secondary | ICD-10-CM

## 2024-06-16 ENCOUNTER — Other Ambulatory Visit: Payer: Self-pay

## 2024-06-16 ENCOUNTER — Other Ambulatory Visit (HOSPITAL_COMMUNITY): Payer: Self-pay

## 2024-06-16 MED ORDER — HYDROXYZINE PAMOATE 25 MG PO CAPS
25.0000 mg | ORAL_CAPSULE | Freq: Three times a day (TID) | ORAL | 0 refills | Status: DC | PRN
  Filled 2024-06-16: qty 30, 10d supply, fill #0

## 2024-06-27 ENCOUNTER — Other Ambulatory Visit (HOSPITAL_COMMUNITY): Payer: Self-pay

## 2024-06-27 ENCOUNTER — Other Ambulatory Visit: Payer: Self-pay | Admitting: Family Medicine

## 2024-06-27 ENCOUNTER — Other Ambulatory Visit: Payer: Self-pay

## 2024-06-27 ENCOUNTER — Ambulatory Visit: Admitting: Family Medicine

## 2024-06-27 ENCOUNTER — Other Ambulatory Visit (HOSPITAL_COMMUNITY)
Admission: RE | Admit: 2024-06-27 | Discharge: 2024-06-27 | Disposition: A | Discharge status: Home / Self Care | Source: Ambulatory Visit | Attending: Family Medicine | Admitting: Family Medicine

## 2024-06-27 ENCOUNTER — Ambulatory Visit: Payer: Self-pay | Admitting: Family Medicine

## 2024-06-27 VITALS — BP 110/82 | HR 80 | Temp 98.7°F | Ht 63.75 in | Wt 182.6 lb

## 2024-06-27 DIAGNOSIS — Z13220 Encounter for screening for lipoid disorders: Secondary | ICD-10-CM

## 2024-06-27 DIAGNOSIS — R7303 Prediabetes: Secondary | ICD-10-CM | POA: Diagnosis not present

## 2024-06-27 DIAGNOSIS — I10 Essential (primary) hypertension: Secondary | ICD-10-CM | POA: Diagnosis not present

## 2024-06-27 DIAGNOSIS — Z124 Encounter for screening for malignant neoplasm of cervix: Secondary | ICD-10-CM

## 2024-06-27 DIAGNOSIS — Z1231 Encounter for screening mammogram for malignant neoplasm of breast: Secondary | ICD-10-CM

## 2024-06-27 DIAGNOSIS — Z0000 Encounter for general adult medical examination without abnormal findings: Secondary | ICD-10-CM | POA: Diagnosis not present

## 2024-06-27 DIAGNOSIS — E876 Hypokalemia: Secondary | ICD-10-CM

## 2024-06-27 LAB — COMPREHENSIVE METABOLIC PANEL WITH GFR
ALT: 24 U/L (ref 0–35)
AST: 21 U/L (ref 0–37)
Albumin: 4.3 g/dL (ref 3.5–5.2)
Alkaline Phosphatase: 44 U/L (ref 39–117)
BUN: 14 mg/dL (ref 6–23)
CO2: 33 meq/L — ABNORMAL HIGH (ref 19–32)
Calcium: 9.1 mg/dL (ref 8.4–10.5)
Chloride: 99 meq/L (ref 96–112)
Creatinine, Ser: 0.86 mg/dL (ref 0.40–1.20)
GFR: 79.44 mL/min (ref >60.00)
Glucose, Bld: 84 mg/dL (ref 70–99)
Potassium: 2.9 meq/L — ABNORMAL LOW (ref 3.5–5.1)
Sodium: 137 meq/L (ref 135–145)
Total Bilirubin: 0.4 mg/dL (ref 0.2–1.2)
Total Protein: 7.5 g/dL (ref 6.0–8.3)

## 2024-06-27 LAB — HEMOGLOBIN A1C: Hgb A1c MFr Bld: 6.3 % (ref 4.6–6.5)

## 2024-06-27 LAB — CBC WITH DIFFERENTIAL/PLATELET
Basophils Absolute: 0.1 K/uL (ref 0.0–0.1)
Basophils Relative: 1.5 % (ref 0.0–3.0)
Eosinophils Absolute: 0.1 K/uL (ref 0.0–0.7)
Eosinophils Relative: 1.6 % (ref 0.0–5.0)
HCT: 39.9 % (ref 36.0–46.0)
Hemoglobin: 13.5 g/dL (ref 12.0–15.0)
Lymphocytes Relative: 30 % (ref 12.0–46.0)
Lymphs Abs: 1.2 K/uL (ref 0.7–4.0)
MCHC: 33.7 g/dL (ref 30.0–36.0)
MCV: 87.4 fl (ref 78.0–100.0)
Monocytes Absolute: 0.5 K/uL (ref 0.1–1.0)
Monocytes Relative: 11.8 % (ref 3.0–12.0)
Neutro Abs: 2.2 K/uL (ref 1.4–7.7)
Neutrophils Relative %: 55.1 % (ref 43.0–77.0)
Platelets: 307 K/uL (ref 150.0–400.0)
RBC: 4.57 Mil/uL (ref 3.87–5.11)
RDW: 13.3 % (ref 11.5–15.5)
WBC: 4 K/uL (ref 4.0–10.5)

## 2024-06-27 LAB — LIPID PANEL
Cholesterol: 150 mg/dL (ref 0–200)
HDL: 47.9 mg/dL (ref >39.00)
LDL Cholesterol: 85 mg/dL (ref 0–99)
NonHDL: 101.93
Total CHOL/HDL Ratio: 3
Triglycerides: 85 mg/dL (ref 0.0–149.0)
VLDL: 17 mg/dL (ref 0.0–40.0)

## 2024-06-27 MED ORDER — POTASSIUM CHLORIDE CRYS ER 20 MEQ PO TBCR
EXTENDED_RELEASE_TABLET | ORAL | 5 refills | Status: AC
  Filled 2024-06-27: qty 60, 30d supply, fill #0

## 2024-06-27 MED ORDER — CHLORTHALIDONE 25 MG PO TABS
25.0000 mg | ORAL_TABLET | Freq: Every day | ORAL | 1 refills | Status: AC
  Filled 2024-06-27: qty 90, 90d supply, fill #0

## 2024-06-27 MED ORDER — AMLODIPINE BESYLATE 10 MG PO TABS
10.0000 mg | ORAL_TABLET | Freq: Every day | ORAL | 1 refills | Status: AC
  Filled 2024-06-27: qty 90, 90d supply, fill #0

## 2024-06-27 MED ORDER — OLMESARTAN MEDOXOMIL 20 MG PO TABS
20.0000 mg | ORAL_TABLET | Freq: Every day | ORAL | 1 refills | Status: AC
  Filled 2024-06-27 – 2024-09-13 (×2): qty 90, 90d supply, fill #0

## 2024-06-27 NOTE — Patient Instructions (Signed)

## 2024-06-27 NOTE — Progress Notes (Signed)
 Complete physical exam  Patient: [name]   DOB: [birth year]   48 y.o. Female  MRN: [id number]  Subjective:    Chief Complaint  Patient presents with   Annual Exam    [name] is a 48 y.o. female who presents today for a complete physical exam. She reports consuming a general diet. Exercise is limited by orthopedic condition(s): torn labrum left hip, had surgery recently. She generally feels well. She reports sleeping fairly well. She does not have additional problems to discuss today.    Most recent fall risk assessment:    09/20/2020   11:57 AM  Fall Risk   Falls in the past year? 0  Number falls in past yr: 0  Injury with Fall? 0  Follow up Falls evaluation completed      Data saved with a previous flowsheet row definition     Most recent depression screenings:    06/17/2023   10:42 AM 12/08/2022   11:00 AM  PHQ 2/9 Scores  PHQ - 2 Score 0 3  PHQ- 9 Score 0 7    Vision:Within last year and Dental: No current dental problems and Receives regular dental care  Patient Active Problem List   Diagnosis Date Noted   Prediabetes 12/21/2023   Tear of left acetabular labrum 11/09/2023   Vitamin D  deficiency 03/31/2022   Chronic cough 09/27/2018   Cervical spondylosis 07/11/2018   Lymphocytic colitis 05/21/2018   Anxiety state 04/29/2016   Atypical chest pain 02/22/2016   Dysphagia, pharyngoesophageal phase 02/22/2016   Physical exam 12/15/2014   Left hip pain 09/29/2014   Radicular low back pain 09/11/2014   Bilateral hip bursitis 09/11/2014   BPV (benign positional vertigo) 02/22/2014   HTN (hypertension) 02/22/2014      Patient Care Team: Ozell Heron HERO, MD as PCP - General (Family Medicine) Mat Browning, MD as Consulting Physician (Obstetrics and Gynecology)   Outpatient Medications Prior to Visit  Medication Sig   acetaminophen  (TYLENOL ) 500 MG tablet Take 1,000 mg by mouth every 6 (six) hours as needed.   amLODipine  (NORVASC ) 10 MG  tablet Take 1 tablet (10 mg total) by mouth daily.   aspirin -acetaminophen -caffeine (EXCEDRIN MIGRAINE) 250-250-65 MG tablet Take 1 tablet by mouth every 6 (six) hours as needed for headache.   celecoxib  (CELEBREX ) 200 MG capsule Take 1 capsule (200 mg total) by mouth 2 (two) times daily as needed.   chlorthalidone  (HYGROTON ) 25 MG tablet Take 1 tablet (25 mg total) by mouth daily.   hydrOXYzine  (VISTARIL ) 25 MG capsule Take 1 capsule (25 mg total) by mouth every 8 (eight) hours as needed.   levocetirizine (XYZAL ) 5 MG tablet Take 1 tablet (5 mg total) by mouth every evening.   levonorgestrel (MIRENA) 20 MCG/DAY IUD 1 each by Intrauterine route once.   montelukast  (SINGULAIR ) 10 MG tablet Take 1 tablet (10 mg total) by mouth at bedtime.   Nutritional Supplements (ESTROVEN PO) Take by mouth daily.   olmesartan  (BENICAR ) 20 MG tablet Take 1 tablet (20 mg total) by mouth daily.   OVER THE COUNTER MEDICATION Collagen supplement drink-once a day   Prenat MV-Min w/Fe-Folate-DHA (PRENATAL COMPLETE PO) Take by mouth daily.   [DISCONTINUED] aspirin  EC 325 MG tablet Take 1 tablet (325 mg total) by mouth daily.   [DISCONTINUED] gabapentin  (NEURONTIN ) 300 MG capsule Take 1 capsule (300 mg total) by mouth 3 (three) times daily as needed.   [DISCONTINUED] HYDROcodone -acetaminophen  (NORCO/VICODIN) 5-325 MG tablet Take 1 tablet by mouth  every 6 (six) hours as needed.   [DISCONTINUED] methylPREDNISolone  (MEDROL  DOSEPAK) 4 MG TBPK tablet Take by mouth per packet instructions for 6 days   [DISCONTINUED] methylPREDNISolone  (MEDROL  DOSEPAK) 4 MG TBPK tablet Take per package directions for 6 days.   [DISCONTINUED] OVER THE COUNTER MEDICATION Glucosamine chondroitin with vitamin D -once a day   [DISCONTINUED] oxyCODONE  (ROXICODONE ) 5 MG immediate release tablet Take 1 tablet (5 mg total) by mouth every 4 (four) hours as needed for severe pain (pain score 7-10) or breakthrough pain.   [DISCONTINUED] predniSONE  (STERAPRED  UNI-PAK 21 TAB) 5 MG (21) TBPK tablet Take as directed per package instructions.   [DISCONTINUED] tiZANidine  (ZANAFLEX ) 2 MG tablet Take 1-2 tablets (2-4 mg total) by mouth every 8 (eight) hours as needed.   No facility-administered medications prior to visit.    Review of Systems  HENT:  Negative for hearing loss.   Eyes:  Negative for blurred vision.  Respiratory:  Negative for shortness of breath.   Cardiovascular:  Negative for chest pain.  Gastrointestinal: Negative.   Genitourinary: Negative.   Musculoskeletal:  Negative for back pain.  Neurological:  Negative for headaches.  Psychiatric/Behavioral:  Negative for depression.        Objective:     BP 110/82   Pulse 80   Temp 98.7 F (37.1 C) (Oral)   Ht 5' 3.75 (1.619 m)   Wt 182 lb 9.6 oz (82.8 kg)   SpO2 100%   BMI 31.59 kg/m    Physical Exam Vitals reviewed. Exam conducted with a chaperone present.  Constitutional:      Appearance: She is normal weight.  Cardiovascular:     Rate and Rhythm: Normal rate and regular rhythm.     Pulses: Normal pulses.     Heart sounds: Normal heart sounds.  Pulmonary:     Effort: Pulmonary effort is normal.     Breath sounds: Normal breath sounds.  Chest:     Chest wall: No mass.  Breasts:    Tanner Score is 5.     Right: Normal. No mass or tenderness.     Left: Normal. No mass or tenderness.  Abdominal:     General: Abdomen is flat. Bowel sounds are normal.     Palpations: Abdomen is soft.  Genitourinary:    General: Normal vulva.     Exam position: Lithotomy position.     Tanner stage (genital): 5.     Vagina: Normal.     Cervix: Normal.     Uterus: Normal.      Adnexa: Right adnexa normal and left adnexa normal.     Rectum: Normal.  Lymphadenopathy:     Upper Body:     Right upper body: No axillary adenopathy.     Left upper body: No axillary adenopathy.  Neurological:     General: No focal deficit present.     Mental Status: She is alert and oriented to  person, place, and time.  Psychiatric:        Mood and Affect: Mood and affect normal.      No results found for any visits on 06/27/24.     Assessment & Plan:    Routine Health Maintenance and Physical Exam  Immunization History  Administered Date(s) Administered   Hepatitis B, ADULT 08/29/2015, 09/26/2015, 02/06/2016   Influenza, Seasonal, Injecte, Preservative Fre 06/30/2019   Influenza,inj,Quad PF,6+ Mos 06/01/2014, 06/07/2015, 05/18/2017, 05/28/2018   Influenza-Unspecified 06/15/2024   PFIZER Comirnaty(Gray Top)Covid-19 Tri-Sucrose Vaccine 09/19/2019   PFIZER(Purple Top)SARS-COV-2  Vaccination 10/07/2019   Tdap 04/05/2019    Health Maintenance  Topic Date Due   Pneumococcal Vaccine (1 of 2 - PCV) Never done   COVID-19 Vaccine (3 - Pfizer risk series) 11/04/2019   Cervical Cancer Screening (HPV/Pap Cotest)  02/26/2021   HIV Screening  06/27/2025 (Originally 04/11/1990)   Mammogram  06/18/2025   Colonoscopy  05/21/2028   DTaP/Tdap/Td (2 - Td or Tdap) 04/04/2029   Influenza Vaccine  Completed   Hepatitis B Vaccines 19-59 Average Risk  Completed   Hepatitis C Screening  Completed   HPV VACCINES  Aged Out   Meningococcal B Vaccine  Aged Out    Discussed health benefits of physical activity, and encouraged her to engage in regular exercise appropriate for her age and condition.  Cervical cancer screening -     Cytology - PAP  Lipid screening -     Lipid panel; Future  Routine general medical examination at a health care facility -     CBC with Differential/Platelet; Future  Prediabetes -     Hemoglobin A1c; Future  Primary hypertension -     Comprehensive metabolic panel with GFR; Future  General physical exam findings are normal today. I reviewed the patient's preventative testing, immunizations, and lifestyle habits. I made appropriate recommendations and placed orders for the appropriate tests and/or vaccinations. I counseled the patient on the CDC's  recommendations for healthy exercise and diet. I counseled the patient on healthy sleep habits and stress management. Handouts to reinforce the counseling were given at the conclusion of the visit.    Return in 1 year (on 06/27/2025).     Heron CHRISTELLA Sharper, MD

## 2024-06-28 ENCOUNTER — Other Ambulatory Visit: Payer: Self-pay

## 2024-06-28 LAB — CYTOLOGY - PAP
Comment: NEGATIVE
Diagnosis: NEGATIVE
High risk HPV: NEGATIVE

## 2024-07-04 ENCOUNTER — Other Ambulatory Visit: Payer: Self-pay | Admitting: Family Medicine

## 2024-07-04 ENCOUNTER — Other Ambulatory Visit

## 2024-07-04 ENCOUNTER — Encounter: Payer: Self-pay | Admitting: Radiology

## 2024-07-04 DIAGNOSIS — E559 Vitamin D deficiency, unspecified: Secondary | ICD-10-CM | POA: Diagnosis not present

## 2024-07-04 LAB — VITAMIN D 25 HYDROXY (VIT D DEFICIENCY, FRACTURES): VITD: 44.62 ng/mL (ref 30.00–100.00)

## 2024-07-05 ENCOUNTER — Ambulatory Visit: Payer: Self-pay | Admitting: Family Medicine

## 2024-07-14 ENCOUNTER — Ambulatory Visit (HOSPITAL_BASED_OUTPATIENT_CLINIC_OR_DEPARTMENT_OTHER): Admitting: Orthopaedic Surgery

## 2024-07-14 ENCOUNTER — Other Ambulatory Visit (HOSPITAL_BASED_OUTPATIENT_CLINIC_OR_DEPARTMENT_OTHER): Payer: Self-pay

## 2024-07-14 DIAGNOSIS — M25552 Pain in left hip: Secondary | ICD-10-CM

## 2024-07-14 MED ORDER — MELOXICAM 15 MG PO TABS
15.0000 mg | ORAL_TABLET | Freq: Every day | ORAL | 0 refills | Status: DC
  Filled 2024-07-14 – 2024-07-18 (×4): qty 14, 14d supply, fill #0

## 2024-07-14 NOTE — Progress Notes (Signed)
 Post Operative Evaluation    Procedure/Date of Surgery: Left hip labral repair 3/10  Interval History:   Presents today 7 months status post above procedure.  She did get very good relief from a psoas injection but is still continuing with persistent pain that radiates to the trochanter   PMH/PSH/Family History/Social History/Meds/Allergies:    Past Medical History:  Diagnosis Date   Arthritis    right foot   Asthma    Congenital flat foot    has had bilateral foot surgery to correct   Frequent headaches    GERD (gastroesophageal reflux disease)    Hypertension    Irritable bowel syndrome (IBS)    no current med.   Painful orthopaedic hardware 03/2013   right foot   Past Surgical History:  Procedure Laterality Date   CALCANEAL OSTEOTOMY Right 12/23/2012   Procedure: RIGHT CALCANEAL OSTEOTOMY, RIGHT LATERAL COLUMN LENGTHENING, RIGHT COTTON OSTEOTOMY, RIGHT FLEX DIGITORIUM LONGUS TRANSFER, RIGHT KIDNER PROCEDURE ;  Surgeon: Norleen Armor, MD;  Location: [facility];  Service: Orthopedics;  Laterality: Right;   CESAREAN SECTION  07/13/2005; 06/03/2006   DILATION AND EVACUATION  08/16/2008   FOOT SURGERY Left 2002   FOOT SURGERY Right 10/2018   GASTROCNEMIUS RECESSION Right 12/23/2012   Procedure: RIGHT GASTRO RECESSION ;  Surgeon: Norleen Armor, MD;  Location: [facility];  Service: Orthopedics;  Laterality: Right;   HARDWARE REMOVAL Right 03/24/2013   Procedure: HARDWARE REMOVAL RIGHT HEEL;  Surgeon: Norleen Armor, MD;  Location: [facility];  Service: Orthopedics;  Laterality: Right;   INTRAUTERINE DEVICE INSERTION     WISDOM TOOTH EXTRACTION     Social History   Socioeconomic History   Marital status: Married    Spouse name: Not on file   Number of children: 3   Years of education: Not on file   Highest education level: Bachelor's degree (e.g., BA, AB, BS)  Occupational History   Occupation: Engineering Geologist: [employer]  Tobacco Use   Smoking status: Never   Smokeless tobacco: Never  Vaping Use   Vaping status: Never Used  Substance and Sexual Activity   Alcohol use: No   Drug use: No   Sexual activity: Yes    Birth control/protection: I.U.D.  Other Topics Concern   Not on file  Social History Narrative   Not on file   Social Drivers of Health   Financial Resource Strain: Medium Risk (06/27/2024)   Overall Financial Resource Strain (CARDIA)    Difficulty of Paying Living Expenses: Somewhat hard  Food Insecurity: No Food Insecurity (06/27/2024)   Hunger Vital Sign    Worried About Running Out of Food in the Last Year: Never true    Ran Out of Food in the Last Year: Never true  Transportation Needs: No Transportation Needs (06/27/2024)   PRAPARE - Administrator, Civil Service (Medical): No    Lack of Transportation (Non-Medical): No  Physical Activity: Inactive (06/27/2024)   Exercise Vital Sign    Days of Exercise per Week: 0 days    Minutes of Exercise per Session: Not on file  Stress: Stress Concern Present (06/27/2024)   [employer] of Occupational Health - Occupational Stress Questionnaire    Feeling of Stress: Very much  Social Connections: Socially Integrated (  06/27/2024)   Social Connection and Isolation Panel    Frequency of Communication with Friends and Family: More than three times a week    Frequency of Social Gatherings with Friends and Family: More than three times a week    Attends Religious Services: More than 4 times per year    Active Member of [employer] or Organizations: Yes    Attends Engineer, Structural: More than 4 times per year    Marital Status: Married   Family History  Problem Relation Age of Onset   Hypertension Mother    Hypertension Father    Diabetes Father    Irritable bowel syndrome Father    Arrhythmia Father    Breast cancer Paternal Grandmother    Colon cancer Cousin        maternal   Rectal cancer  Neg Hx    Esophageal cancer Neg Hx    Stomach cancer Neg Hx    Allergies  Allergen Reactions   Augmentin  [Amoxicillin -Pot Clavulanate] Nausea And Vomiting   Biotin Other (See Comments)    Severe yeast infection   Sulfa Antibiotics Other (See Comments)    Reaction:  Muscle pain    Current Outpatient Medications  Medication Sig Dispense Refill   meloxicam  (MOBIC ) 15 MG tablet Take 1 tablet (15 mg total) by mouth daily. 14 tablet 0   acetaminophen  (TYLENOL ) 500 MG tablet Take 1,000 mg by mouth every 6 (six) hours as needed.     amLODipine  (NORVASC ) 10 MG tablet Take 1 tablet (10 mg total) by mouth daily. 90 tablet 1   aspirin -acetaminophen -caffeine (EXCEDRIN MIGRAINE) 250-250-65 MG tablet Take 1 tablet by mouth every 6 (six) hours as needed for headache.     celecoxib  (CELEBREX ) 200 MG capsule Take 1 capsule (200 mg total) by mouth 2 (two) times daily as needed. 60 capsule 5   chlorthalidone  (HYGROTON ) 25 MG tablet Take 1 tablet (25 mg total) by mouth daily. 90 tablet 1   hydrOXYzine  (VISTARIL ) 25 MG capsule Take 1 capsule (25 mg total) by mouth every 8 (eight) hours as needed. 30 capsule 0   levocetirizine (XYZAL ) 5 MG tablet Take 1 tablet (5 mg total) by mouth every evening. 90 tablet 1   levonorgestrel (MIRENA) 20 MCG/DAY IUD 1 each by Intrauterine route once.     montelukast  (SINGULAIR ) 10 MG tablet Take 1 tablet (10 mg total) by mouth at bedtime. 90 tablet 1   Nutritional Supplements (ESTROVEN PO) Take by mouth daily.     olmesartan  (BENICAR ) 20 MG tablet Take 1 tablet (20 mg total) by mouth daily. 90 tablet 1   OVER THE COUNTER MEDICATION Collagen supplement drink-once a day     potassium chloride SA (KLOR-CON M) 20 MEQ tablet Take 2 tablets (40 mEq total) by mouth daily for 30 days, THEN 1 tablet (20 mEq total) daily. 60 tablet 5   Prenat MV-Min w/Fe-Folate-DHA (PRENATAL COMPLETE PO) Take by mouth daily.     No current facility-administered medications for this visit.   No results  found.  Review of Systems:   A ROS was performed including pertinent positives and negatives as documented in the HPI.   Musculoskeletal Exam:    There were no vitals taken for this visit.  Left hip incisions are well-healed.  Range of motion is 30 degrees internal rotation with negative FADIR.  She has tenderness with resisted flexion of the left hip.  Imaging:      I personally reviewed and interpreted the radiographs.  Assessment:   7 months status post left hip labral repair.  She has been having a flareup of pain after recent fall.  She did get temporary relief from an ultrasound-guided psoas injection but is continuing to have radiating pain to the gluteus.  This time I would like to obtain an MRI to see if there is any type of underlying gluteus tear Plan :    - Plan for MRI left hip and follow-up discussed results    I personally saw and evaluated the patient, and participated in the management and treatment plan.  Elspeth Parker, MD Attending Physician, Orthopedic Surgery  This document was dictated using Dragon voice recognition software. A reasonable attempt at proof reading has been made to minimize errors.

## 2024-07-18 ENCOUNTER — Other Ambulatory Visit (HOSPITAL_COMMUNITY): Payer: Self-pay

## 2024-07-18 ENCOUNTER — Other Ambulatory Visit (HOSPITAL_BASED_OUTPATIENT_CLINIC_OR_DEPARTMENT_OTHER): Payer: Self-pay

## 2024-07-18 ENCOUNTER — Ambulatory Visit
Admission: RE | Admit: 2024-07-18 | Discharge: 2024-07-18 | Disposition: A | Source: Ambulatory Visit | Attending: Family Medicine

## 2024-07-18 ENCOUNTER — Encounter (HOSPITAL_BASED_OUTPATIENT_CLINIC_OR_DEPARTMENT_OTHER): Payer: Self-pay | Admitting: Orthopaedic Surgery

## 2024-07-18 DIAGNOSIS — Z1231 Encounter for screening mammogram for malignant neoplasm of breast: Secondary | ICD-10-CM | POA: Diagnosis not present

## 2024-07-19 ENCOUNTER — Ambulatory Visit
Admission: RE | Admit: 2024-07-19 | Discharge: 2024-07-19 | Disposition: A | Discharge status: Home / Self Care | Source: Ambulatory Visit | Attending: Orthopaedic Surgery | Admitting: Orthopaedic Surgery

## 2024-07-19 DIAGNOSIS — M7602 Gluteal tendinitis, left hip: Secondary | ICD-10-CM | POA: Diagnosis not present

## 2024-07-19 DIAGNOSIS — M25552 Pain in left hip: Secondary | ICD-10-CM

## 2024-07-20 ENCOUNTER — Ambulatory Visit: Payer: Self-pay | Admitting: Family Medicine

## 2024-07-20 ENCOUNTER — Encounter (HOSPITAL_BASED_OUTPATIENT_CLINIC_OR_DEPARTMENT_OTHER): Admitting: Orthopaedic Surgery

## 2024-07-23 ENCOUNTER — Encounter: Payer: Self-pay | Admitting: Family Medicine

## 2024-08-07 ENCOUNTER — Other Ambulatory Visit: Payer: Self-pay | Admitting: Family Medicine

## 2024-08-07 DIAGNOSIS — F40243 Fear of flying: Secondary | ICD-10-CM

## 2024-08-08 ENCOUNTER — Other Ambulatory Visit (HOSPITAL_COMMUNITY): Payer: Self-pay

## 2024-08-08 ENCOUNTER — Other Ambulatory Visit: Payer: Self-pay

## 2024-08-08 MED ORDER — HYDROXYZINE PAMOATE 25 MG PO CAPS
25.0000 mg | ORAL_CAPSULE | Freq: Three times a day (TID) | ORAL | 2 refills | Status: AC | PRN
  Filled 2024-08-08: qty 30, 10d supply, fill #0

## 2024-08-10 ENCOUNTER — Other Ambulatory Visit (HOSPITAL_BASED_OUTPATIENT_CLINIC_OR_DEPARTMENT_OTHER): Payer: Self-pay

## 2024-08-10 ENCOUNTER — Ambulatory Visit (HOSPITAL_BASED_OUTPATIENT_CLINIC_OR_DEPARTMENT_OTHER): Admitting: Orthopaedic Surgery

## 2024-08-10 ENCOUNTER — Ambulatory Visit (HOSPITAL_BASED_OUTPATIENT_CLINIC_OR_DEPARTMENT_OTHER): Payer: Self-pay | Admitting: Orthopaedic Surgery

## 2024-08-10 DIAGNOSIS — S73192D Other sprain of left hip, subsequent encounter: Secondary | ICD-10-CM | POA: Diagnosis not present

## 2024-08-10 MED ORDER — MELOXICAM 15 MG PO TABS
15.0000 mg | ORAL_TABLET | Freq: Every day | ORAL | 1 refills | Status: AC
  Filled 2024-08-10 – 2024-08-11 (×2): qty 30, 30d supply, fill #0
  Filled 2024-09-13 – 2024-09-16 (×3): qty 30, 30d supply, fill #1

## 2024-08-10 NOTE — Progress Notes (Signed)
 Post Operative Evaluation    Procedure/Date of Surgery: Left hip labral repair 3/10  Interval History:   Presents today for follow-up status post above procedure.  She is here today for MRI discussion   PMH/PSH/Family History/Social History/Meds/Allergies:    Past Medical History:  Diagnosis Date   Arthritis    right foot   Asthma    Congenital flat foot    has had bilateral foot surgery to correct   Frequent headaches    GERD (gastroesophageal reflux disease)    Hypertension    Irritable bowel syndrome (IBS)    no current med.   Painful orthopaedic hardware 03/2013   right foot   Past Surgical History:  Procedure Laterality Date   CALCANEAL OSTEOTOMY Right 12/23/2012   Procedure: RIGHT CALCANEAL OSTEOTOMY, RIGHT LATERAL COLUMN LENGTHENING, RIGHT COTTON OSTEOTOMY, RIGHT FLEX DIGITORIUM LONGUS TRANSFER, RIGHT KIDNER PROCEDURE ;  Surgeon: Norleen Armor, MD;  Location: [facility];  Service: Orthopedics;  Laterality: Right;   CESAREAN SECTION  07/13/2005; 06/03/2006   DILATION AND EVACUATION  08/16/2008   FOOT SURGERY Left 2002   FOOT SURGERY Right 10/2018   GASTROCNEMIUS RECESSION Right 12/23/2012   Procedure: RIGHT GASTRO RECESSION ;  Surgeon: Norleen Armor, MD;  Location: [facility];  Service: Orthopedics;  Laterality: Right;   HARDWARE REMOVAL Right 03/24/2013   Procedure: HARDWARE REMOVAL RIGHT HEEL;  Surgeon: Norleen Armor, MD;  Location: [facility];  Service: Orthopedics;  Laterality: Right;   INTRAUTERINE DEVICE INSERTION     WISDOM TOOTH EXTRACTION     Social History   Socioeconomic History   Marital status: Married    Spouse name: Not on file   Number of children: 3   Years of education: Not on file   Highest education level: Bachelor's degree (e.g., BA, AB, BS)  Occupational History   Occupation: Teacher, Adult Education: [employer]  Tobacco Use   Smoking status: Never   Smokeless tobacco:  Never  Vaping Use   Vaping status: Never Used  Substance and Sexual Activity   Alcohol use: No   Drug use: No   Sexual activity: Yes    Birth control/protection: I.U.D.  Other Topics Concern   Not on file  Social History Narrative   Not on file   Social Drivers of Health   Financial Resource Strain: Medium Risk (06/27/2024)   Overall Financial Resource Strain (CARDIA)    Difficulty of Paying Living Expenses: Somewhat hard  Food Insecurity: No Food Insecurity (06/27/2024)   Hunger Vital Sign    Worried About Running Out of Food in the Last Year: Never true    Ran Out of Food in the Last Year: Never true  Transportation Needs: No Transportation Needs (06/27/2024)   PRAPARE - Administrator, Civil Service (Medical): No    Lack of Transportation (Non-Medical): No  Physical Activity: Inactive (06/27/2024)   Exercise Vital Sign    Days of Exercise per Week: 0 days    Minutes of Exercise per Session: Not on file  Stress: Stress Concern Present (06/27/2024)   [employer] of Occupational Health - Occupational Stress Questionnaire    Feeling of Stress: Very much  Social Connections: Socially Integrated (06/27/2024)   Social Connection and Isolation Panel    Frequency of Communication with  Friends and Family: More than three times a week    Frequency of Social Gatherings with Friends and Family: More than three times a week    Attends Religious Services: More than 4 times per year    Active Member of [employer] or Organizations: Yes    Attends Engineer, Structural: More than 4 times per year    Marital Status: Married   Family History  Problem Relation Age of Onset   Hypertension Mother    Hypertension Father    Diabetes Father    Irritable bowel syndrome Father    Arrhythmia Father    Breast cancer Paternal Grandmother    Colon cancer Cousin        maternal   Rectal cancer Neg Hx    Esophageal cancer Neg Hx    Stomach cancer Neg Hx    Allergies   Allergen Reactions   Augmentin  [Amoxicillin -Pot Clavulanate] Nausea And Vomiting   Biotin Other (See Comments)    Severe yeast infection   Sulfa Antibiotics Other (See Comments)    Reaction:  Muscle pain    Current Outpatient Medications  Medication Sig Dispense Refill   meloxicam  (MOBIC ) 15 MG tablet Take 1 tablet (15 mg total) by mouth daily. 30 tablet 1   acetaminophen  (TYLENOL ) 500 MG tablet Take 1,000 mg by mouth every 6 (six) hours as needed.     amLODipine  (NORVASC ) 10 MG tablet Take 1 tablet (10 mg total) by mouth daily. 90 tablet 1   aspirin -acetaminophen -caffeine (EXCEDRIN MIGRAINE) 250-250-65 MG tablet Take 1 tablet by mouth every 6 (six) hours as needed for headache.     celecoxib  (CELEBREX ) 200 MG capsule Take 1 capsule (200 mg total) by mouth 2 (two) times daily as needed. 60 capsule 5   chlorthalidone  (HYGROTON ) 25 MG tablet Take 1 tablet (25 mg total) by mouth daily. 90 tablet 1   hydrOXYzine  (VISTARIL ) 25 MG capsule Take 1 capsule (25 mg total) by mouth every 8 (eight) hours as needed. 30 capsule 2   levocetirizine (XYZAL ) 5 MG tablet Take 1 tablet (5 mg total) by mouth every evening. 90 tablet 1   levonorgestrel (MIRENA) 20 MCG/DAY IUD 1 each by Intrauterine route once.     montelukast  (SINGULAIR ) 10 MG tablet Take 1 tablet (10 mg total) by mouth at bedtime. 90 tablet 1   Nutritional Supplements (ESTROVEN PO) Take by mouth daily.     olmesartan  (BENICAR ) 20 MG tablet Take 1 tablet (20 mg total) by mouth daily. 90 tablet 1   OVER THE COUNTER MEDICATION Collagen supplement drink-once a day     potassium chloride  SA (KLOR-CON  M) 20 MEQ tablet Take 2 tablets (40 mEq total) by mouth daily for 30 days, THEN 1 tablet (20 mEq total) daily. 60 tablet 5   Prenat MV-Min w/Fe-Folate-DHA (PRENATAL COMPLETE PO) Take by mouth daily.     No current facility-administered medications for this visit.   No results found.  Review of Systems:   A ROS was performed including pertinent  positives and negatives as documented in the HPI.   Musculoskeletal Exam:    There were no vitals taken for this visit.  Left hip incisions are well-healed.  Range of motion is 30 degrees internal rotation with negative FADIR.  She has tenderness with resisted flexion of the left hip.  Imaging:    MRI left hip: There is a recurrent tear of the anterior superior labrum with tearing of the gluteus minimus as well as medius  I  personally reviewed and interpreted the radiographs.   Assessment:   Status post left hip labral repair.  She has been having a flareup of pain after recent fall.  Unfortunately I did discuss that with her fall I do believe she has now torn her labrum following surgery.  There is evidence of gluteus minimus and medius tear as well.  At this time she has trialed physical therapy without any relief.  She is having mechanical symptoms in the setting again of a traumatic injury.  Given this we did discuss the possibility of surgery given the fact that she has now failed over 3 months of physical therapy.  I did discuss the possibility of labral reconstruction with gluteus medius repair.  I discussed wrist limitations.  After discussion she would like to proceed   Plan :    - Plan for left hip arthroscopy with labral reconstruction and gluteus medius repair   After a lengthy discussion of treatment options, including risks, benefits, alternatives, complications of surgical and nonsurgical conservative options, the patient elected surgical repair.   The patient  is aware of the material risks  and complications including, but not limited to injury to adjacent structures, neurovascular injury, infection, numbness, bleeding, implant failure, thermal burns, stiffness, persistent pain, failure to heal, disease transmission from allograft, need for further surgery, dislocation, anesthetic risks, blood clots, risks of death,and others. The probabilities of surgical success and  failure discussed with patient given their particular co-morbidities.The time and nature of expected rehabilitation and recovery was discussed.The patient's questions were all answered preoperatively.  No barriers to understanding were noted. I explained the natural history of the disease process and Rx rationale.  I explained to the patient what I considered to be reasonable expectations given their personal situation.  The final treatment plan was arrived at through a shared patient decision making process model.     I personally saw and evaluated the patient, and participated in the management and treatment plan.  Elspeth Parker, MD Attending Physician, Orthopedic Surgery  This document was dictated using Dragon voice recognition software. A reasonable attempt at proof reading has been made to minimize errors.

## 2024-08-11 ENCOUNTER — Other Ambulatory Visit (HOSPITAL_COMMUNITY): Payer: Self-pay

## 2024-08-11 ENCOUNTER — Other Ambulatory Visit (HOSPITAL_BASED_OUTPATIENT_CLINIC_OR_DEPARTMENT_OTHER): Payer: Self-pay

## 2024-09-02 ENCOUNTER — Ambulatory Visit (HOSPITAL_BASED_OUTPATIENT_CLINIC_OR_DEPARTMENT_OTHER): Admitting: Student

## 2024-09-02 DIAGNOSIS — S76012D Strain of muscle, fascia and tendon of left hip, subsequent encounter: Secondary | ICD-10-CM | POA: Diagnosis not present

## 2024-09-02 DIAGNOSIS — S73192D Other sprain of left hip, subsequent encounter: Secondary | ICD-10-CM

## 2024-09-02 MED ORDER — LIDOCAINE HCL 1 % IJ SOLN
4.0000 mL | INTRAMUSCULAR | Status: AC | PRN
  Administered 2024-09-02: 4 mL

## 2024-09-02 NOTE — Progress Notes (Signed)
 "                                Post Operative Evaluation    Procedure/Date of Surgery: Left hip labral repair 3/10  Interval History:    Patient presents to clinic today for follow-up evaluation of her left hip as well as a possible injection.  She is still awaiting scheduling for surgery and states that she has to wait until at least April.  She was seen by Dr. Genelle on 12/10 and was recommended for left hip arthroscopy with labral reconstruction and gluteus medius repair.  The majority of her pain today is in the lateral hip.   PMH/PSH/Family History/Social History/Meds/Allergies:    Past Medical History:  Diagnosis Date   Arthritis    right foot   Asthma    Congenital flat foot    has had bilateral foot surgery to correct   Frequent headaches    GERD (gastroesophageal reflux disease)    Hypertension    Irritable bowel syndrome (IBS)    no current med.   Painful orthopaedic hardware 03/2013   right foot   Past Surgical History:  Procedure Laterality Date   CALCANEAL OSTEOTOMY Right 12/23/2012   Procedure: RIGHT CALCANEAL OSTEOTOMY, RIGHT LATERAL COLUMN LENGTHENING, RIGHT COTTON OSTEOTOMY, RIGHT FLEX DIGITORIUM LONGUS TRANSFER, RIGHT KIDNER PROCEDURE ;  Surgeon: Norleen Armor, MD;  Location: [facility];  Service: Orthopedics;  Laterality: Right;   CESAREAN SECTION  07/13/2005; 06/03/2006   DILATION AND EVACUATION  08/16/2008   FOOT SURGERY Left 2002   FOOT SURGERY Right 10/2018   GASTROCNEMIUS RECESSION Right 12/23/2012   Procedure: RIGHT GASTRO RECESSION ;  Surgeon: Norleen Armor, MD;  Location: [facility];  Service: Orthopedics;  Laterality: Right;   HARDWARE REMOVAL Right 03/24/2013   Procedure: HARDWARE REMOVAL RIGHT HEEL;  Surgeon: Norleen Armor, MD;  Location: [facility];  Service: Orthopedics;  Laterality: Right;   INTRAUTERINE DEVICE INSERTION     WISDOM TOOTH EXTRACTION     Social History   Socioeconomic History    Marital status: Married    Spouse name: Not on file   Number of children: 3   Years of education: Not on file   Highest education level: Bachelor's degree (e.g., BA, AB, BS)  Occupational History   Occupation: Teacher, Adult Education: [employer]  Tobacco Use   Smoking status: Never   Smokeless tobacco: Never  Vaping Use   Vaping status: Never Used  Substance and Sexual Activity   Alcohol use: No   Drug use: No   Sexual activity: Yes    Birth control/protection: I.U.D.  Other Topics Concern   Not on file  Social History Narrative   Not on file   Social Drivers of Health   Tobacco Use: Low Risk (06/27/2024)   Patient History    Smoking Tobacco Use: Never    Smokeless Tobacco Use: Never    Passive Exposure: Not on file  Financial Resource Strain: Medium Risk (06/27/2024)   Overall Financial Resource Strain (CARDIA)    Difficulty of Paying Living Expenses: Somewhat hard  Food Insecurity: No Food Insecurity (06/27/2024)   Epic    Worried About Radiation Protection Practitioner of Food in the Last Year: Never true    Ran Out of Food in the Last Year: Never true  Transportation Needs: No Transportation Needs (06/27/2024)   Epic    Lack of Transportation (  Medical): No    Lack of Transportation (Non-Medical): No  Physical Activity: Inactive (06/27/2024)   Exercise Vital Sign    Days of Exercise per Week: 0 days    Minutes of Exercise per Session: Not on file  Stress: Stress Concern Present (06/27/2024)   [employer] of Occupational Health - Occupational Stress Questionnaire    Feeling of Stress: Very much  Social Connections: Socially Integrated (06/27/2024)   Social Connection and Isolation Panel    Frequency of Communication with Friends and Family: More than three times a week    Frequency of Social Gatherings with Friends and Family: More than three times a week    Attends Religious Services: More than 4 times per year    Active Member of Clubs or Organizations: Yes    Attends Tax Inspector Meetings: More than 4 times per year    Marital Status: Married  Depression (PHQ2-9): Low Risk (06/17/2023)   Depression (PHQ2-9)    PHQ-2 Score: 0  Alcohol Screen: Not on file  Housing: High Risk (06/27/2024)   Epic    Unable to Pay for Housing in the Last Year: Yes    Number of Times Moved in the Last Year: 0    Homeless in the Last Year: No  Utilities: Not on file  Health Literacy: Not on file   Family History  Problem Relation Age of Onset   Hypertension Mother    Hypertension Father    Diabetes Father    Irritable bowel syndrome Father    Arrhythmia Father    Breast cancer Paternal Grandmother    Colon cancer Cousin        maternal   Rectal cancer Neg Hx    Esophageal cancer Neg Hx    Stomach cancer Neg Hx    Allergies  Allergen Reactions   Augmentin  [Amoxicillin -Pot Clavulanate] Nausea And Vomiting   Biotin Other (See Comments)    Severe yeast infection   Sulfa Antibiotics Other (See Comments)    Reaction:  Muscle pain    Current Outpatient Medications  Medication Sig Dispense Refill   acetaminophen  (TYLENOL ) 500 MG tablet Take 1,000 mg by mouth every 6 (six) hours as needed.     amLODipine  (NORVASC ) 10 MG tablet Take 1 tablet (10 mg total) by mouth daily. 90 tablet 1   aspirin -acetaminophen -caffeine (EXCEDRIN MIGRAINE) 250-250-65 MG tablet Take 1 tablet by mouth every 6 (six) hours as needed for headache.     celecoxib  (CELEBREX ) 200 MG capsule Take 1 capsule (200 mg total) by mouth 2 (two) times daily as needed. 60 capsule 5   chlorthalidone  (HYGROTON ) 25 MG tablet Take 1 tablet (25 mg total) by mouth daily. 90 tablet 1   hydrOXYzine  (VISTARIL ) 25 MG capsule Take 1 capsule (25 mg total) by mouth every 8 (eight) hours as needed. 30 capsule 2   levocetirizine (XYZAL ) 5 MG tablet Take 1 tablet (5 mg total) by mouth every evening. 90 tablet 1   levonorgestrel (MIRENA) 20 MCG/DAY IUD 1 each by Intrauterine route once.     meloxicam  (MOBIC ) 15 MG tablet  Take 1 tablet (15 mg total) by mouth daily. 30 tablet 1   montelukast  (SINGULAIR ) 10 MG tablet Take 1 tablet (10 mg total) by mouth at bedtime. 90 tablet 1   Nutritional Supplements (ESTROVEN PO) Take by mouth daily.     olmesartan  (BENICAR ) 20 MG tablet Take 1 tablet (20 mg total) by mouth daily. 90 tablet 1   OVER THE COUNTER MEDICATION  Collagen supplement drink-once a day     potassium chloride  SA (KLOR-CON  M) 20 MEQ tablet Take 2 tablets (40 mEq total) by mouth daily for 30 days, THEN 1 tablet (20 mEq total) daily. 60 tablet 5   Prenat MV-Min w/Fe-Folate-DHA (PRENATAL COMPLETE PO) Take by mouth daily.     No current facility-administered medications for this visit.   No results found.  Review of Systems:   A ROS was performed including pertinent positives and negatives as documented in the HPI.   Musculoskeletal Exam:    There were no vitals taken for this visit.  Exam of the left hip demonstrates point tenderness over the greater trochanter and gluteus medius minimus tendon distribution.  No overlying erythema or warmth.  30 degrees of passive internal and external hip rotation.  Imaging:     Assessment:   Patient presents today with more lateral sided left hip pain.  She is awaiting approval and scheduling for a labral reconstruction with gluteus medius repair.  The majority of her current symptoms appear related to the gluteus medius and minimus tears, therefore I have offered a trochanteric injection today for symptomatic relief.  Patient is agreeable and injection was performed under ultrasound guidance which she tolerated very well.  Will assess relief with this and have her follow-up as needed.  Plan :    - Left hip greater trochanter injection performed today    Procedure Note  Patient: [name]             Date of Birth: [birth year]           MRN: [id number]             Visit Date: 09/02/2024  Procedures: Visit Diagnoses:  1. Tear of left acetabular labrum,  subsequent encounter   2. Tear of left gluteus medius tendon, subsequent encounter     Large Joint Inj: L greater trochanter on 09/02/2024 5:25 PM Indications: pain Details: 22 G 3.5 in needle, lateral approach Medications: 4 mL lidocaine  1 % (2 mL betamethasone) Outcome: tolerated well, no immediate complications Procedure, treatment alternatives, risks and benefits explained, specific risks discussed. Consent was given by the patient. Immediately prior to procedure a time out was called to verify the correct patient, procedure, equipment, support staff and site/side marked as required. Patient was prepped and draped in the usual sterile fashion.      I personally saw and evaluated the patient, and participated in the management and treatment plan.   Leonce Reveal, PA-C Orthopedics  This document was dictated using Conservation officer, historic buildings. A reasonable attempt at proof reading has been made to minimize errors. "

## 2024-09-13 ENCOUNTER — Other Ambulatory Visit: Payer: Self-pay

## 2024-09-13 ENCOUNTER — Other Ambulatory Visit (HOSPITAL_COMMUNITY): Payer: Self-pay

## 2024-09-13 ENCOUNTER — Other Ambulatory Visit (HOSPITAL_BASED_OUTPATIENT_CLINIC_OR_DEPARTMENT_OTHER): Payer: Self-pay

## 2024-09-14 ENCOUNTER — Other Ambulatory Visit (HOSPITAL_COMMUNITY): Payer: Self-pay

## 2024-09-14 ENCOUNTER — Other Ambulatory Visit: Payer: Self-pay

## 2024-09-14 ENCOUNTER — Encounter (HOSPITAL_BASED_OUTPATIENT_CLINIC_OR_DEPARTMENT_OTHER): Payer: Self-pay | Admitting: Orthopaedic Surgery

## 2024-09-14 ENCOUNTER — Other Ambulatory Visit (HOSPITAL_BASED_OUTPATIENT_CLINIC_OR_DEPARTMENT_OTHER): Payer: Self-pay | Admitting: Orthopaedic Surgery

## 2024-09-14 MED ORDER — METHOCARBAMOL 500 MG PO TABS
500.0000 mg | ORAL_TABLET | Freq: Four times a day (QID) | ORAL | 2 refills | Status: AC
  Filled 2024-09-14: qty 30, 8d supply, fill #0

## 2024-09-16 ENCOUNTER — Other Ambulatory Visit (HOSPITAL_BASED_OUTPATIENT_CLINIC_OR_DEPARTMENT_OTHER): Payer: Self-pay

## 2024-09-16 ENCOUNTER — Other Ambulatory Visit (HOSPITAL_COMMUNITY): Payer: Self-pay

## 2024-09-28 ENCOUNTER — Ambulatory Visit (HOSPITAL_BASED_OUTPATIENT_CLINIC_OR_DEPARTMENT_OTHER): Admitting: Orthopaedic Surgery

## 2024-09-28 DIAGNOSIS — S73192D Other sprain of left hip, subsequent encounter: Secondary | ICD-10-CM

## 2024-09-28 MED ORDER — TRIAMCINOLONE ACETONIDE 40 MG/ML IJ SUSP
80.0000 mg | INTRAMUSCULAR | Status: AC | PRN
  Administered 2024-09-28: 80 mg via INTRA_ARTICULAR

## 2024-09-28 MED ORDER — LIDOCAINE HCL 1 % IJ SOLN
4.0000 mL | INTRAMUSCULAR | Status: AC | PRN
  Administered 2024-09-28: 4 mL

## 2024-09-28 NOTE — Progress Notes (Signed)
 "                                Post Operative Evaluation    Procedure/Date of Surgery: Left hip labral repair 3/10  Interval History:   Presents today for follow-up status post above procedure.  Unfortunately she did only get 2 days of relief from her trochanteric injection.  She is here today seeking additional injection  PMH/PSH/Family History/Social History/Meds/Allergies:    Past Medical History:  Diagnosis Date   Arthritis    right foot   Asthma    Congenital flat foot    has had bilateral foot surgery to correct   Frequent headaches    GERD (gastroesophageal reflux disease)    Hypertension    Irritable bowel syndrome (IBS)    no current med.   Painful orthopaedic hardware 03/2013   right foot   Past Surgical History:  Procedure Laterality Date   CALCANEAL OSTEOTOMY Right 12/23/2012   Procedure: RIGHT CALCANEAL OSTEOTOMY, RIGHT LATERAL COLUMN LENGTHENING, RIGHT COTTON OSTEOTOMY, RIGHT FLEX DIGITORIUM LONGUS TRANSFER, RIGHT KIDNER PROCEDURE ;  Surgeon: Norleen Armor, MD;  Location: [facility];  Service: Orthopedics;  Laterality: Right;   CESAREAN SECTION  07/13/2005; 06/03/2006   DILATION AND EVACUATION  08/16/2008   FOOT SURGERY Left 2002   FOOT SURGERY Right 10/2018   GASTROCNEMIUS RECESSION Right 12/23/2012   Procedure: RIGHT GASTRO RECESSION ;  Surgeon: Norleen Armor, MD;  Location: [facility];  Service: Orthopedics;  Laterality: Right;   HARDWARE REMOVAL Right 03/24/2013   Procedure: HARDWARE REMOVAL RIGHT HEEL;  Surgeon: Norleen Armor, MD;  Location: [facility];  Service: Orthopedics;  Laterality: Right;   INTRAUTERINE DEVICE INSERTION     WISDOM TOOTH EXTRACTION     Social History   Socioeconomic History   Marital status: Married    Spouse name: Not on file   Number of children: 3   Years of education: Not on file   Highest education level: Bachelor's degree (e.g., BA, AB, BS)  Occupational  History   Occupation: Teacher, Adult Education: [employer]  Tobacco Use   Smoking status: Never   Smokeless tobacco: Never  Vaping Use   Vaping status: Never Used  Substance and Sexual Activity   Alcohol use: No   Drug use: No   Sexual activity: Yes    Birth control/protection: I.U.D.  Other Topics Concern   Not on file  Social History Narrative   Not on file   Social Drivers of Health   Tobacco Use: Low Risk (06/27/2024)   Patient History    Smoking Tobacco Use: Never    Smokeless Tobacco Use: Never    Passive Exposure: Not on file  Financial Resource Strain: Medium Risk (06/27/2024)   Overall Financial Resource Strain (CARDIA)    Difficulty of Paying Living Expenses: Somewhat hard  Food Insecurity: No Food Insecurity (06/27/2024)   Epic    Worried About Programme Researcher, Broadcasting/film/video in the Last Year: Never true    Ran Out of Food in the Last Year: Never true  Transportation Needs: No Transportation Needs (06/27/2024)   Epic    Lack of Transportation (Medical): No    Lack of Transportation (Non-Medical): No  Physical Activity: Inactive (06/27/2024)   Exercise Vital Sign    Days of Exercise per Week: 0 days    Minutes of Exercise per Session: Not on file  Stress: Stress  Concern Present (06/27/2024)   [employer] of Occupational Health - Occupational Stress Questionnaire    Feeling of Stress: Very much  Social Connections: Socially Integrated (06/27/2024)   Social Connection and Isolation Panel    Frequency of Communication with Friends and Family: More than three times a week    Frequency of Social Gatherings with Friends and Family: More than three times a week    Attends Religious Services: More than 4 times per year    Active Member of Clubs or Organizations: Yes    Attends Banker Meetings: More than 4 times per year    Marital Status: Married  Depression (PHQ2-9): Low Risk (06/17/2023)   Depression (PHQ2-9)    PHQ-2 Score: 0   Alcohol Screen: Not on file  Housing: High Risk (06/27/2024)   Epic    Unable to Pay for Housing in the Last Year: Yes    Number of Times Moved in the Last Year: 0    Homeless in the Last Year: No  Utilities: Not on file  Health Literacy: Not on file   Family History  Problem Relation Age of Onset   Hypertension Mother    Hypertension Father    Diabetes Father    Irritable bowel syndrome Father    Arrhythmia Father    Breast cancer Paternal Grandmother    Colon cancer Cousin        maternal   Rectal cancer Neg Hx    Esophageal cancer Neg Hx    Stomach cancer Neg Hx    Allergies  Allergen Reactions   Augmentin  [Amoxicillin -Pot Clavulanate] Nausea And Vomiting   Biotin Other (See Comments)    Severe yeast infection   Sulfa Antibiotics Other (See Comments)    Reaction:  Muscle pain    Current Outpatient Medications  Medication Sig Dispense Refill   acetaminophen  (TYLENOL ) 500 MG tablet Take 1,000 mg by mouth every 6 (six) hours as needed.     amLODipine  (NORVASC ) 10 MG tablet Take 1 tablet (10 mg total) by mouth daily. 90 tablet 1   aspirin -acetaminophen -caffeine (EXCEDRIN MIGRAINE) 250-250-65 MG tablet Take 1 tablet by mouth every 6 (six) hours as needed for headache.     celecoxib  (CELEBREX ) 200 MG capsule Take 1 capsule (200 mg total) by mouth 2 (two) times daily as needed. 60 capsule 5   chlorthalidone  (HYGROTON ) 25 MG tablet Take 1 tablet (25 mg total) by mouth daily. 90 tablet 1   hydrOXYzine  (VISTARIL ) 25 MG capsule Take 1 capsule (25 mg total) by mouth every 8 (eight) hours as needed. 30 capsule 2   levocetirizine (XYZAL ) 5 MG tablet Take 1 tablet (5 mg total) by mouth every evening. 90 tablet 1   levonorgestrel (MIRENA) 20 MCG/DAY IUD 1 each by Intrauterine route once.     meloxicam  (MOBIC ) 15 MG tablet Take 1 tablet (15 mg total) by mouth daily. 30 tablet 1   methocarbamol  (ROBAXIN ) 500 MG tablet Take 1 tablet (500 mg total) by mouth 4  (four) times daily. 30 tablet 2   montelukast  (SINGULAIR ) 10 MG tablet Take 1 tablet (10 mg total) by mouth at bedtime. 90 tablet 1   Nutritional Supplements (ESTROVEN PO) Take by mouth daily.     olmesartan  (BENICAR ) 20 MG tablet Take 1 tablet (20 mg total) by mouth daily. 90 tablet 1   OVER THE COUNTER MEDICATION Collagen supplement drink-once a day     potassium chloride  SA (KLOR-CON  M) 20 MEQ tablet Take 2 tablets (40 mEq  total) by mouth daily for 30 days, THEN 1 tablet (20 mEq total) daily. 60 tablet 5   Prenat MV-Min w/Fe-Folate-DHA (PRENATAL COMPLETE PO) Take by mouth daily.     No current facility-administered medications for this visit.   No results found.  Review of Systems:   A ROS was performed including pertinent positives and negatives as documented in the HPI.   Musculoskeletal Exam:    There were no vitals taken for this visit.  Left hip incisions are well-healed.  Range of motion is 30 degrees internal rotation with negative FADIR.  She has tenderness with resisted flexion of the left hip.  Imaging:    MRI left hip: There is a recurrent tear of the anterior superior labrum with tearing of the gluteus minimus as well as medius  I personally reviewed and interpreted the radiographs.   Assessment:   Status post left hip labral repair.  She has been having a flareup of pain after recent fall.  Unfortunately I did discuss that with her fall I do believe she has now torn her labrum following surgery.  There is evidence of gluteus minimus and medius tear as well.  At this time she has trialed physical therapy without any relief.  She is having mechanical symptoms in the setting again of a traumatic injury.  Given this we did discuss the possibility of surgery given the fact that she has now failed over 3 months of physical therapy.  I did discuss the possibility of labral reconstruction with gluteus medius repair.  I discussed wrist limitations.  After discussion she  would like to proceed   Plan :    - Plan for left hip arthroscopy with labral reconstruction and gluteus medius repair   After a lengthy discussion of treatment options, including risks, benefits, alternatives, complications of surgical and nonsurgical conservative options, the patient elected surgical repair.   The patient  is aware of the material risks  and complications including, but not limited to injury to adjacent structures, neurovascular injury, infection, numbness, bleeding, implant failure, thermal burns, stiffness, persistent pain, failure to heal, disease transmission from allograft, need for further surgery, dislocation, anesthetic risks, blood clots, risks of death,and others. The probabilities of surgical success and failure discussed with patient given their particular co-morbidities.The time and nature of expected rehabilitation and recovery was discussed.The patient's questions were all answered preoperatively.  No barriers to understanding were noted. I explained the natural history of the disease process and Rx rationale.  I explained to the patient what I considered to be reasonable expectations given their personal situation.  The final treatment plan was arrived at through a shared patient decision making process model.  I did recommend a left hip intra-articular injection to get her some additional relief.  She did proceed with this after verbal consent was obtained    Procedure Note  Patient: [name]             Date of Birth: 11-20-1975           MRN: [id number]             Visit Date: 09/28/2024  Procedures: Visit Diagnoses: No diagnosis found.  Large Joint Inj: L hip joint on 09/28/2024 5:27 PM Indications: pain Details: 22 G 3.5 in needle, ultrasound-guided anterolateral approach  Arthrogram: No  Medications: 4 mL lidocaine  1 %; 80 mg triamcinolone  acetonide 40 MG/ML Outcome: tolerated well, no immediate complications Procedure, treatment  alternatives, risks and benefits explained, specific risks discussed.  Consent was given by the patient. Immediately prior to procedure a time out was called to verify the correct patient, procedure, equipment, support staff and site/side marked as required. Patient was prepped and draped in the usual sterile fashion.          I personally saw and evaluated the patient, and participated in the management and treatment plan.  Elspeth Parker, MD Attending Physician, Orthopedic Surgery  This document was dictated using Dragon voice recognition software. A reasonable attempt at proof reading has been made to minimize errors. "

## 2024-10-31 ENCOUNTER — Encounter (HOSPITAL_BASED_OUTPATIENT_CLINIC_OR_DEPARTMENT_OTHER): Payer: Self-pay

## 2024-10-31 ENCOUNTER — Ambulatory Visit (HOSPITAL_BASED_OUTPATIENT_CLINIC_OR_DEPARTMENT_OTHER): Admit: 2024-10-31 | Admitting: Orthopaedic Surgery

## 2024-10-31 SURGERY — ARTHROSCOPY HIP
Anesthesia: General | Site: Hip | Laterality: Left

## 2024-11-11 ENCOUNTER — Encounter (HOSPITAL_BASED_OUTPATIENT_CLINIC_OR_DEPARTMENT_OTHER): Admitting: Orthopaedic Surgery

## 2024-12-12 ENCOUNTER — Encounter (HOSPITAL_BASED_OUTPATIENT_CLINIC_OR_DEPARTMENT_OTHER): Payer: Self-pay

## 2024-12-12 ENCOUNTER — Ambulatory Visit (HOSPITAL_BASED_OUTPATIENT_CLINIC_OR_DEPARTMENT_OTHER): Admit: 2024-12-12 | Admitting: Orthopaedic Surgery

## 2024-12-23 ENCOUNTER — Encounter (HOSPITAL_BASED_OUTPATIENT_CLINIC_OR_DEPARTMENT_OTHER): Admitting: Orthopaedic Surgery
